# Patient Record
Sex: Female | Born: 1985 | Hispanic: No | Marital: Married | Smoking: Never smoker
Health system: Southern US, Community
[De-identification: ages and names within clinical notes are randomized; demographics above are authoritative.]

## PROBLEM LIST (undated history)

## (undated) DIAGNOSIS — R519 Headache, unspecified: Secondary | ICD-10-CM

## (undated) DIAGNOSIS — O24419 Gestational diabetes mellitus in pregnancy, unspecified control: Secondary | ICD-10-CM

## (undated) DIAGNOSIS — O139 Gestational [pregnancy-induced] hypertension without significant proteinuria, unspecified trimester: Secondary | ICD-10-CM

## (undated) DIAGNOSIS — Z789 Other specified health status: Secondary | ICD-10-CM

## (undated) HISTORY — PX: NO PAST SURGERIES: SHX2092

## (undated) HISTORY — DX: Gestational (pregnancy-induced) hypertension without significant proteinuria, unspecified trimester: O13.9

---

## 2012-05-04 NOTE — L&D Delivery Note (Signed)
Delivery Note At 10:33 PM a viable female was delivered via Vaginal, Spontaneous Delivery (Presentation: ;  ).  Median episiotomy cut with immediate delivery of the head, nuchal cord noted, easily reduced after summersault maneuver.  APGAR: 8, 9; weight 7 lb 6.5 oz (3359 g).  Placenta status: Intact, spontaneous.  Cord: 3 vessels with the following complications: None.  Cord pH: not collected  Anesthesia: Epidural Local  Episiotomy: Median Lacerations: 3rd degree Suture Repair: 2.0 3.0 vicryl Est. Blood Loss (mL): 400  Mom to postpartum.  Baby to Couplet care / Skin to Skin.  Routine [facility] PP orders Breastfeeding Inpatient circ  Tami Crawford 03/17/2013, 1:30 AM

## 2012-07-21 ENCOUNTER — Ambulatory Visit: Payer: BC Managed Care – PPO | Admitting: Emergency Medicine

## 2012-07-21 VITALS — BP 116/70 | HR 76 | Temp 98.6°F | Resp 12 | Ht 67.0 in | Wt 170.0 lb

## 2012-07-21 DIAGNOSIS — Z331 Pregnant state, incidental: Secondary | ICD-10-CM

## 2012-07-21 DIAGNOSIS — R109 Unspecified abdominal pain: Secondary | ICD-10-CM

## 2012-07-21 DIAGNOSIS — R509 Fever, unspecified: Secondary | ICD-10-CM

## 2012-07-21 LAB — POCT URINALYSIS DIPSTICK
Blood, UA: NEGATIVE
Glucose, UA: NEGATIVE
Nitrite, UA: NEGATIVE
Protein, UA: NEGATIVE
Urobilinogen, UA: 0.2

## 2012-07-21 LAB — POCT CBC
Granulocyte percent: 46.3 %G (ref 37–80)
MCH, POC: 27.5 pg (ref 27–31.2)
MID (cbc): 0.5 (ref 0–0.9)
MPV: 9.4 fL (ref 0–99.8)
POC LYMPH PERCENT: 45.5 %L (ref 10–50)
POC MID %: 8.2 %M (ref 0–12)
Platelet Count, POC: 256 10*3/uL (ref 142–424)
RBC: 4.59 M/uL (ref 4.04–5.48)
RDW, POC: 15 %
WBC: 6 10*3/uL (ref 4.6–10.2)

## 2012-07-21 LAB — POCT UA - MICROSCOPIC ONLY
Casts, Ur, LPF, POC: NEGATIVE
Crystals, Ur, HPF, POC: NEGATIVE
WBC, Ur, HPF, POC: NEGATIVE
Yeast, UA: NEGATIVE

## 2012-07-21 MED ORDER — PRENATAL VIT W/ FE BISG-FA 25-1 MG PO TABS: 1.0000 | ORAL_TABLET | Freq: Every day | ORAL | Status: DC

## 2012-07-21 NOTE — Patient Instructions (Addendum)

## 2012-07-21 NOTE — Progress Notes (Signed)
  Subjective:    Patient ID: Tami Crawford, female    DOB: 10/18/1985, 27 y.o.   MRN: [id number]  HPI  27 year old female presents with history of fever at night x 3 weeks; abdominal pain 2 days ago; LMP 06/21/12.  patient is not on birth control; patient has never been pregnant.   Husband complains he has a bump on his penis.  Denies vaginal itching.  she has vaginal discharge.    Country of origin: [place].    Review of Systems     Objective:   Physical Exam  The patient is alert and cooperative and does not appear ill. The abdomen is flat the patient describes tenderness in the suprapubic area but I do not feel a mass in this area. Results for orders placed in visit on 07/21/12  POCT CBC      Result Value Range   WBC 6.0  4.6 - 10.2 K/uL   Lymph, poc 2.7  0.6 - 3.4   POC LYMPH PERCENT 45.5  10 - 50 %L   MID (cbc) 0.5  0 - 0.9   POC MID % 8.2  0 - 12 %M   POC Granulocyte 2.8  2 - 6.9   Granulocyte percent 46.3  37 - 80 %G   RBC 4.59  4.04 - 5.48 M/uL   Hemoglobin 12.6  12.2 - 16.2 g/dL   HCT, POC 14.7  82.9 - 47.9 %   MCV 85.8  80 - 97 fL   MCH, POC 27.5  27 - 31.2 pg   MCHC 32.0  31.8 - 35.4 g/dL   RDW, POC 56.2     Platelet Count, POC 256  142 - 424 K/uL   MPV 9.4  0 - 99.8 fL  POCT URINALYSIS DIPSTICK      Result Value Range   Color, UA yellow     Clarity, UA clear     Glucose, UA negative     Bilirubin, UA negative     Ketones, UA negative     Spec Grav, UA 1.020     Blood, UA negative     pH, UA 6.0     Protein, UA negative     Urobilinogen, UA 0.2     Nitrite, UA negative     Leukocytes, UA Negative    POCT URINE PREGNANCY      Result Value Range   Preg Test, Ur Positive    POCT UA - MICROSCOPIC ONLY      Result Value Range   WBC, Ur, HPF, POC negative     RBC, urine, microscopic 0-1     Bacteria, U Microscopic negative     Mucus, UA trace     Epithelial cells, urine per micros 0-2     Crystals, Ur, HPF, POC negative     Casts, Ur, LPF, POC negative      Yeast, UA negative         Assessment & Plan:  We'll start with a CBC urine and urine hCG. If these are unrevealing we'll probably need to per proceed with a vaginal exam and check for vaginitis. Apparently there is some question that the husband has some bumps in his genital area and I'll go ahead and check this also .

## 2012-08-18 LAB — OB RESULTS CONSOLE ANTIBODY SCREEN: Antibody Screen: NEGATIVE

## 2012-08-18 LAB — OB RESULTS CONSOLE ABO/RH: RH Type: POSITIVE

## 2012-08-18 LAB — OB RESULTS CONSOLE HEPATITIS B SURFACE ANTIGEN: Hepatitis B Surface Ag: NEGATIVE

## 2012-08-18 LAB — OB RESULTS CONSOLE RPR: RPR: NONREACTIVE

## 2012-09-08 ENCOUNTER — Other Ambulatory Visit: Payer: Self-pay | Admitting: Obstetrics and Gynecology

## 2012-09-08 ENCOUNTER — Other Ambulatory Visit (HOSPITAL_COMMUNITY)
Admission: RE | Admit: 2012-09-08 | Discharge: 2012-09-08 | Disposition: A | Discharge status: Home / Self Care | Payer: BC Managed Care – PPO | Source: Ambulatory Visit | Attending: Obstetrics and Gynecology | Admitting: Obstetrics and Gynecology

## 2012-09-08 DIAGNOSIS — Z01419 Encounter for gynecological examination (general) (routine) without abnormal findings: Secondary | ICD-10-CM | POA: Insufficient documentation

## 2012-11-07 ENCOUNTER — Other Ambulatory Visit: Payer: Self-pay | Admitting: Obstetrics and Gynecology

## 2012-11-07 DIAGNOSIS — Z3689 Encounter for other specified antenatal screening: Secondary | ICD-10-CM

## 2012-11-09 ENCOUNTER — Ambulatory Visit (HOSPITAL_COMMUNITY)
Admission: RE | Admit: 2012-11-09 | Discharge: 2012-11-09 | Disposition: A | Discharge status: Home / Self Care | Payer: BC Managed Care – PPO | Source: Ambulatory Visit | Attending: Obstetrics and Gynecology | Admitting: Obstetrics and Gynecology

## 2012-11-09 DIAGNOSIS — Z1389 Encounter for screening for other disorder: Secondary | ICD-10-CM | POA: Insufficient documentation

## 2012-11-09 DIAGNOSIS — O0930 Supervision of pregnancy with insufficient antenatal care, unspecified trimester: Secondary | ICD-10-CM | POA: Insufficient documentation

## 2012-11-09 DIAGNOSIS — Z3689 Encounter for other specified antenatal screening: Secondary | ICD-10-CM

## 2012-11-09 DIAGNOSIS — Z363 Encounter for antenatal screening for malformations: Secondary | ICD-10-CM | POA: Insufficient documentation

## 2012-11-09 DIAGNOSIS — O358XX0 Maternal care for other (suspected) fetal abnormality and damage, not applicable or unspecified: Secondary | ICD-10-CM | POA: Insufficient documentation

## 2013-02-11 LAB — OB RESULTS CONSOLE GBS: GBS: POSITIVE

## 2013-03-14 ENCOUNTER — Inpatient Hospital Stay (HOSPITAL_COMMUNITY)
Admission: AD | Admit: 2013-03-14 | Discharge: 2013-03-18 | DRG: 775 | Disposition: A | Discharge status: Home / Self Care | Payer: BC Managed Care – PPO | Source: Ambulatory Visit | Attending: Obstetrics and Gynecology | Admitting: Obstetrics and Gynecology

## 2013-03-14 ENCOUNTER — Encounter (HOSPITAL_COMMUNITY): Payer: Self-pay | Admitting: *Deleted

## 2013-03-14 ENCOUNTER — Other Ambulatory Visit: Payer: Self-pay | Admitting: Obstetrics and Gynecology

## 2013-03-14 DIAGNOSIS — Z22330 Carrier of Group B streptococcus: Secondary | ICD-10-CM

## 2013-03-14 DIAGNOSIS — D279 Benign neoplasm of unspecified ovary: Secondary | ICD-10-CM | POA: Diagnosis present

## 2013-03-14 DIAGNOSIS — Z8759 Personal history of other complications of pregnancy, childbirth and the puerperium: Secondary | ICD-10-CM | POA: Diagnosis present

## 2013-03-14 DIAGNOSIS — O139 Gestational [pregnancy-induced] hypertension without significant proteinuria, unspecified trimester: Principal | ICD-10-CM | POA: Diagnosis present

## 2013-03-14 DIAGNOSIS — O99892 Other specified diseases and conditions complicating childbirth: Secondary | ICD-10-CM | POA: Diagnosis present

## 2013-03-14 DIAGNOSIS — E876 Hypokalemia: Secondary | ICD-10-CM | POA: Diagnosis not present

## 2013-03-14 DIAGNOSIS — O99820 Streptococcus B carrier state complicating pregnancy: Secondary | ICD-10-CM

## 2013-03-14 DIAGNOSIS — O34599 Maternal care for other abnormalities of gravid uterus, unspecified trimester: Secondary | ICD-10-CM | POA: Diagnosis present

## 2013-03-14 HISTORY — DX: Other specified health status: Z78.9

## 2013-03-14 LAB — CBC
Hemoglobin: 11.4 g/dL — ABNORMAL LOW (ref 12.0–15.0)
MCH: 29.5 pg (ref 26.0–34.0)
MCHC: 34.3 g/dL (ref 30.0–36.0)
Platelets: 162 10*3/uL (ref 150–400)
RDW: 13.9 % (ref 11.5–15.5)

## 2013-03-14 LAB — COMPREHENSIVE METABOLIC PANEL
ALT: 11 U/L (ref 0–35)
AST: 19 U/L (ref 0–37)
Albumin: 2.8 g/dL — ABNORMAL LOW (ref 3.5–5.2)
Alkaline Phosphatase: 161 U/L — ABNORMAL HIGH (ref 39–117)
Potassium: 2.6 mEq/L — CL (ref 3.5–5.1)
Sodium: 138 mEq/L (ref 135–145)
Total Protein: 5.9 g/dL — ABNORMAL LOW (ref 6.0–8.3)

## 2013-03-14 MED ORDER — TERBUTALINE SULFATE 1 MG/ML IJ SOLN: 0.2500 mg | Freq: Once | INTRAMUSCULAR | Status: AC | PRN

## 2013-03-14 MED ORDER — ACETAMINOPHEN 325 MG PO TABS: 650.0000 mg | ORAL_TABLET | ORAL | Status: DC | PRN

## 2013-03-14 MED ORDER — PENICILLIN G POTASSIUM 5000000 UNITS IJ SOLR
2.5000 10*6.[IU] | INTRAVENOUS | Status: DC
  Administered 2013-03-15: 2.5 10*6.[IU] via INTRAVENOUS
  Filled 2013-03-14 (×5): qty 2.5

## 2013-03-14 MED ORDER — ONDANSETRON HCL 4 MG/2ML IJ SOLN
4.0000 mg | Freq: Four times a day (QID) | INTRAMUSCULAR | Status: DC | PRN
  Administered 2013-03-16: 4 mg via INTRAVENOUS
  Filled 2013-03-14: qty 2

## 2013-03-14 MED ORDER — PENICILLIN G POTASSIUM 5000000 UNITS IJ SOLR
5.0000 10*6.[IU] | Freq: Once | INTRAVENOUS | Status: AC
  Administered 2013-03-15: 5 10*6.[IU] via INTRAVENOUS
  Filled 2013-03-14: qty 5

## 2013-03-14 MED ORDER — LACTATED RINGERS IV SOLN
500.0000 mL | INTRAVENOUS | Status: DC | PRN
  Administered 2013-03-14: 500 mL via INTRAVENOUS

## 2013-03-14 MED ORDER — BUTORPHANOL TARTRATE 1 MG/ML IJ SOLN
1.0000 mg | INTRAMUSCULAR | Status: DC | PRN
  Administered 2013-03-16: 1 mg via INTRAVENOUS
  Filled 2013-03-14: qty 1

## 2013-03-14 MED ORDER — OXYTOCIN BOLUS FROM INFUSION
500.0000 mL | INTRAVENOUS | Status: DC
  Administered 2013-03-16: 500 mL via INTRAVENOUS

## 2013-03-14 MED ORDER — OXYTOCIN 40 UNITS IN LACTATED RINGERS INFUSION - SIMPLE MED: 62.5000 mL/h | INTRAVENOUS | Status: DC

## 2013-03-14 MED ORDER — POTASSIUM CHLORIDE 2 MEQ/ML IV SOLN
INTRAVENOUS | Status: DC
  Administered 2013-03-14 – 2013-03-15 (×2): via INTRAVENOUS
  Filled 2013-03-14 (×5): qty 1000

## 2013-03-14 MED ORDER — LABETALOL HCL 5 MG/ML IV SOLN
10.0000 mg | INTRAVENOUS | Status: DC | PRN
  Filled 2013-03-14: qty 8

## 2013-03-14 MED ORDER — OXYCODONE-ACETAMINOPHEN 5-325 MG PO TABS: 1.0000 | ORAL_TABLET | ORAL | Status: DC | PRN

## 2013-03-14 MED ORDER — OXYTOCIN 40 UNITS IN LACTATED RINGERS INFUSION - SIMPLE MED
1.0000 m[IU]/min | INTRAVENOUS | Status: DC
  Administered 2013-03-14: 1 m[IU]/min via INTRAVENOUS
  Filled 2013-03-14: qty 1000

## 2013-03-14 MED ORDER — MISOPROSTOL 25 MCG QUARTER TABLET: 25.0000 ug | ORAL_TABLET | ORAL | Status: DC | PRN

## 2013-03-14 MED ORDER — IBUPROFEN 600 MG PO TABS
600.0000 mg | ORAL_TABLET | Freq: Four times a day (QID) | ORAL | Status: DC | PRN
  Administered 2013-03-17: 600 mg via ORAL
  Filled 2013-03-14: qty 1

## 2013-03-14 MED ORDER — LIDOCAINE HCL (PF) 1 % IJ SOLN
30.0000 mL | INTRAMUSCULAR | Status: DC | PRN
  Filled 2013-03-14 (×2): qty 30

## 2013-03-14 MED ORDER — LACTATED RINGERS IV SOLN
INTRAVENOUS | Status: DC
  Administered 2013-03-14: 21:00:00 via INTRAVENOUS

## 2013-03-14 MED ORDER — OXYTOCIN 40 UNITS IN LACTATED RINGERS INFUSION - SIMPLE MED: 1.0000 m[IU]/min | INTRAVENOUS | Status: DC

## 2013-03-14 MED ORDER — CITRIC ACID-SODIUM CITRATE 334-500 MG/5ML PO SOLN: 30.0000 mL | ORAL | Status: DC | PRN

## 2013-03-14 NOTE — Progress Notes (Addendum)
Subjective: Resting comfortably--husband at bedside as translator.  Objective: BP 120/67  Pulse 76  Temp(Src) 98.3 F (36.8 C) (Oral)  Resp 18  LMP 06/21/2012  Filed Vitals:   03/14/13 2232 03/14/13 2301 03/14/13 2309 03/14/13 2332  BP: 132/65 121/68 129/76 120/67  Pulse: 80 82 78 76  Temp:    98.3 F (36.8 C)  TempSrc:    Oral  Resp: 18 18 18 18      Total I/O In: -  Out: 1000 [Urine:1000]  Results for orders placed during the hospital encounter of 03/14/13 (from the past 24 hour(s))  CBC     Status: Abnormal   Collection Time    03/14/13  8:40 PM      Result Value Range   WBC 8.0  4.0 - 10.5 K/uL   RBC 3.87  3.87 - 5.11 MIL/uL   Hemoglobin 11.4 (*) 12.0 - 15.0 g/dL   HCT 78.2 (*) 95.6 - 21.3 %   MCV 85.8  78.0 - 100.0 fL   MCH 29.5  26.0 - 34.0 pg   MCHC 34.3  30.0 - 36.0 g/dL   RDW 08.6  57.8 - 46.9 %   Platelets 162  150 - 400 K/uL  COMPREHENSIVE METABOLIC PANEL     Status: Abnormal   Collection Time    03/14/13  8:40 PM      Result Value Range   Sodium 138  135 - 145 mEq/L   Potassium 2.6 (*) 3.5 - 5.1 mEq/L   Chloride 104  96 - 112 mEq/L   CO2 23  19 - 32 mEq/L   Glucose, Bld 168 (*) 70 - 99 mg/dL   BUN 4 (*) 6 - 23 mg/dL   Creatinine, Ser 6.29 (*) 0.50 - 1.10 mg/dL   Calcium 9.6  8.4 - 52.8 mg/dL   Total Protein 5.9 (*) 6.0 - 8.3 g/dL   Albumin 2.8 (*) 3.5 - 5.2 g/dL   AST 19  0 - 37 U/L   ALT 11  0 - 35 U/L   Alkaline Phosphatase 161 (*) 39 - 117 U/L   Total Bilirubin 0.3  0.3 - 1.2 mg/dL   GFR calc non Af Amer >90  >90 mL/min   GFR calc Af Amer >90  >90 mL/min  LACTATE DEHYDROGENASE     Status: None   Collection Time    03/14/13  8:40 PM      Result Value Range   LDH 168  94 - 250 U/L  URIC ACID     Status: None   Collection Time    03/14/13  8:40 PM      Result Value Range   Uric Acid, Serum 3.2  2.4 - 7.0 mg/dL   Protein/creatnine ratio pending.  FHT:  Initial tracing baseline 150-160, with frequent quick variables.  Frequent  uterine irritability noted, with one more defined contraction--had associated decel at 8:56p with that contraction, with FHR with marked variability during decel and nadir of decel to 90 bpm over 3 min period.  BP at the time of that decel was 149/97 Subsequent tracing still had elevated baseline, frequent mild variables--one additional decel at 10:20p associated with more defined contraction at 9:45p, with BP 127/64. FHR now reactive, single decel with UC at 11:28p, but moderate variability and baseline 120-130.  UC:   Moderate irritability, occasional more defined UCs SVE:   Dilation: Fingertip Effacement (%): Thick Station: -3 Exam by:: Manfred Arch,, CNM Cervis posterior, firm, vtx high but definitively vtx.  DTR 2+ without clonus, 1+ edema.  Assessment / Plan: Induction for gestational hypertension--BP normal at present. Sporadic FHR changes with UCs, but overall reassuring. Unfavorable cervix Moderate uterine irritability GBS positive Hypokalemia  Plan: Consulted with Dr. Pennie Rushing Low-dose pitocin initiated for cervical ripening at 11:09p. IV LR with 40 meq KCL at 125 cc/hr for K+ replacement. Reviewed status and plan of care with patient and her husband, with husband as interpreter per patient request.  Advised them we would carefully watch the FHR.  If FHR demonstrated persistent non-reassuring status, C/S may be required.   GBS prophylaxis. Labetalol IV prn BP elevation.   Nigel Bridgeman 03/14/2013, 11:44 PM

## 2013-03-14 NOTE — Progress Notes (Signed)
Critical potassium value of 2.6 reported to v [facility] cnm

## 2013-03-14 NOTE — H&P (Signed)
Tami Crawford is a 27 y.o. female G1 at 24 2/7 weeks revised by a 22 week ultrasound admitted for induction of labor due to [facility] at term. Pt presented at office for routine ob visit today.  BP was 120/98.  Pt denies headache, visual changes, RUQ pain.  Has had elevated diastolic BP for the last month but it has been labile.  1 week prior BP was 120/88.  Pt has been ruled out for preeclampsia twice.  Negative protein on urine dip today.  Incidentally, she has had hypokalemia.  Last potassium was 2.8 on 11/4.  She is s/p a 2 day course of K-Dur. Pregnancy has also been complicated by a right dermoid cyst.  At 22 weeks, it measured 5 cm. One week ago, it was 4.5 cm.  Pt has been asymptomatic. Pt is from [place] and her first language is [place].  Her husband speaks English moderately well.  Maternal Medical History:  Reason for admission: Nausea. Gestational hypertension, elevated diastolic BP  Contractions: Frequency: rare.   Perceived severity is mild.    Fetal activity: Perceived fetal activity is normal.   Last perceived fetal movement was within the past hour.    Prenatal complications: PIH.   Right dermoid cyst ~4.5 cm  Prenatal Complications - Diabetes: none.    OB History   Grav Para Term Preterm Abortions TAB SAB Ect Mult Living                 No past medical history on file. No past surgical history on file. Family History: family history is not on file. Social History:  reports that she has never smoked. She does not have any smokeless tobacco history on file. She reports that she does not drink alcohol or use illicit drugs.   Prenatal Transfer Tool  Maternal Diabetes: No Genetic Screening: Normal Maternal Ultrasounds/Referrals: Abnormal:  Findings:   Other: Fetal Ultrasounds or other Referrals:  Other: Mother with right dermoid cyst. Maternal Substance Abuse:  No Significant Maternal Medications:  None Significant Maternal Lab Results:  Lab values include: Group B Strep  positive Other Comments:  From [place], [place]  Review of Systems  Constitutional: Negative for fever and chills.  Eyes:       Denies visual changes.  Respiratory: Negative for shortness of breath.   Gastrointestinal: Negative for nausea, vomiting and abdominal pain.  Neurological: Negative for headaches.      There were no vitals taken for this visit. Maternal Exam:  Uterine Assessment: No contractions  Abdomen: Patient reports no abdominal tenderness. Fundal height is 39 cm.   Estimated fetal weight is 8 1/2 lbs by Le[place], 7lbs 14 oz by ultrasound on 03/07/13.   Fetal presentation: vertex  Introitus: Normal vulva. Normal vagina.  Ferning test: not done.  Nitrazine test: not done. Amniotic fluid character: not assessed.  Pelvis: adequate for delivery.   Cervix: Cervix evaluated by digital exam.     Fetal Exam Fetal Monitor Review: Mode: hand-held doppler probe.   Baseline rate: 140s.      Physical Exam  Constitutional: She is oriented to person, place, and time. She appears well-developed and well-nourished. No distress.  HENT:  Head: Normocephalic and atraumatic.  Eyes: EOM are normal.  Neck: Normal range of motion.  Cardiovascular: Normal rate, regular rhythm and normal heart sounds.   Respiratory: Effort normal and breath sounds normal. No respiratory distress. She has no wheezes. She has no rales.  GI:  Gravid, no RUQ pain  Genitourinary: Uterus normal.  Musculoskeletal: Normal range of motion. She exhibits edema.  Neurological: She is alert and oriented to person, place, and time. She has normal reflexes.  1-2+ DTR  Skin: Skin is warm and dry. She is not diaphoretic.  Psychiatric: She has a normal mood and affect. Her behavior is normal.    Prenatal labs: ABO, Rh:   Antibody:   Rubella:   RPR:    HBsAg:    HIV:    GBS:     Assessment/Plan: IUP at 40 2/7 weeks, Gestational hypertension.  Mildly elevated diastolic today. Right dermoid  cyst. GBS+ H/o hypokalemia. Unfavorable cervix.  Due to gestational hypertension at term, with elevated BP, recommend induction of labor. PIH labs and protein/creatnine ratio on admission. Cytotec per vagina.  Pitocin if SROM or 2 cm.  If FT-1 in am, will attempt to place a Foley bulb. Penicillin with active labor or SROM.  Replace K+ prn. Husband interpreted management plan and indication for delivery.  All questions answered.  Geryl Rankins 03/14/2013, 6:15 PM

## 2013-03-15 ENCOUNTER — Inpatient Hospital Stay (HOSPITAL_COMMUNITY): Payer: BC Managed Care – PPO

## 2013-03-15 DIAGNOSIS — Z22330 Carrier of Group B streptococcus: Secondary | ICD-10-CM

## 2013-03-15 DIAGNOSIS — O99820 Streptococcus B carrier state complicating pregnancy: Secondary | ICD-10-CM

## 2013-03-15 DIAGNOSIS — Z8759 Personal history of other complications of pregnancy, childbirth and the puerperium: Secondary | ICD-10-CM | POA: Diagnosis present

## 2013-03-15 DIAGNOSIS — O139 Gestational [pregnancy-induced] hypertension without significant proteinuria, unspecified trimester: Secondary | ICD-10-CM | POA: Diagnosis present

## 2013-03-15 HISTORY — DX: Personal history of other complications of pregnancy, childbirth and the puerperium: Z87.59

## 2013-03-15 LAB — HEMOGLOBIN A1C
Hgb A1c MFr Bld: 5.7 % — ABNORMAL HIGH (ref <5.7)
Mean Plasma Glucose: 117 mg/dL — ABNORMAL HIGH (ref <117)

## 2013-03-15 LAB — COMPREHENSIVE METABOLIC PANEL
AST: 17 U/L (ref 0–37)
Albumin: 2.6 g/dL — ABNORMAL LOW (ref 3.5–5.2)
Alkaline Phosphatase: 167 U/L — ABNORMAL HIGH (ref 39–117)
BUN: 3 mg/dL — ABNORMAL LOW (ref 6–23)
Chloride: 107 mEq/L (ref 96–112)
Potassium: 2.7 mEq/L — CL (ref 3.5–5.1)
Sodium: 142 mEq/L (ref 135–145)
Total Bilirubin: 0.5 mg/dL (ref 0.3–1.2)
Total Protein: 5.8 g/dL — ABNORMAL LOW (ref 6.0–8.3)

## 2013-03-15 LAB — CBC
MCHC: 34.1 g/dL (ref 30.0–36.0)
Platelets: 152 10*3/uL (ref 150–400)
RDW: 13.9 % (ref 11.5–15.5)

## 2013-03-15 LAB — PROTEIN / CREATININE RATIO, URINE
Creatinine, Urine: 14.89 mg/dL
Protein Creatinine Ratio: 0.28 — ABNORMAL HIGH (ref 0.00–0.15)
Total Protein, Urine: 4.2 mg/dL

## 2013-03-15 LAB — RUBELLA SCREEN: Rubella: 6.27 Index — ABNORMAL HIGH (ref <0.90)

## 2013-03-15 LAB — MAGNESIUM: Magnesium: 1.4 mg/dL — ABNORMAL LOW (ref 1.5–2.5)

## 2013-03-15 LAB — LACTATE DEHYDROGENASE: LDH: 154 U/L (ref 94–250)

## 2013-03-15 MED ORDER — LACTATED RINGERS IV SOLN
INTRAVENOUS | Status: DC
  Administered 2013-03-15 – 2013-03-16 (×3): via INTRAVENOUS

## 2013-03-15 MED ORDER — POTASSIUM CHLORIDE 2 MEQ/ML IV SOLN: INTRAVENOUS | Status: DC

## 2013-03-15 MED ORDER — MAGNESIUM SULFATE 40 MG/ML IJ SOLN
2.0000 g | Freq: Once | INTRAMUSCULAR | Status: DC
  Filled 2013-03-15 (×2): qty 50

## 2013-03-15 MED ORDER — OXYTOCIN 40 UNITS IN LACTATED RINGERS INFUSION - SIMPLE MED
1.0000 m[IU]/min | INTRAVENOUS | Status: DC
  Administered 2013-03-15: 2 m[IU]/min via INTRAVENOUS
  Filled 2013-03-15: qty 1000

## 2013-03-15 MED ORDER — TERBUTALINE SULFATE 1 MG/ML IJ SOLN: 0.2500 mg | Freq: Once | INTRAMUSCULAR | Status: AC | PRN

## 2013-03-15 MED ORDER — MAGNESIUM SULFATE 50 % IJ SOLN
2.0000 g | Freq: Once | INTRAVENOUS | Status: AC
  Administered 2013-03-15: 2 g via INTRAVENOUS
  Filled 2013-03-15: qty 4

## 2013-03-15 MED ORDER — POTASSIUM CHLORIDE 10 MEQ/100ML IV SOLN
10.0000 meq | INTRAVENOUS | Status: AC
  Administered 2013-03-15 (×3): 10 meq via INTRAVENOUS
  Filled 2013-03-15 (×3): qty 100

## 2013-03-15 MED ORDER — POTASSIUM CHLORIDE CRYS ER 20 MEQ PO TBCR
40.0000 meq | EXTENDED_RELEASE_TABLET | Freq: Two times a day (BID) | ORAL | Status: AC
  Administered 2013-03-15 (×2): 40 meq via ORAL
  Filled 2013-03-15 (×2): qty 2

## 2013-03-15 NOTE — Progress Notes (Signed)
Tami Crawford is a 27 y.o. G1P0 at [redacted]w[redacted]d admitted for gestational hypertension  Per Dr. Pennie Rushing, on call MD overnight, fetal tracing was not reassuring upon arrival so Cytotec was deferred and low dose Pitocin was started.  After hydration, fetal tracing improved.  BPs normal to mildly elevated.  Potassium was 2.6.  40 meq of KCl added to IVF.  Labs ordered this am.  Subjective: History obtained through husband.  Pt without complaints.  Objective: BP 126/84  Pulse 82  Temp(Src) 98.2 F (36.8 C) (Oral)  Resp 18  Ht 5\' 8"  (1.727 m)  Wt 91.173 kg (201 lb)  BMI 30.57 kg/m2  LMP 06/21/2012 I/O last 3 completed shifts: In: -  Out: 1000 [Urine:1000]    FHT:  Reactive, late decel x 1 min, good recovery.  Overall reassuring UC:   irregular SVE:   Dilation: Fingertip Effacement (%): Thick Station: -3 Exam by:: Manfred Arch,, CNM Cervix much softer but closed. Labs: Lab Results  Component Value Date   WBC 7.2 03/15/2013   HGB 12.1 03/15/2013   HCT 35.5* 03/15/2013   MCV 84.9 03/15/2013   PLT 152 03/15/2013   K+ 2.6 to 2.7 Pr/Cr ratio 0.28  Assessment / Plan: Induction of labor due to gestational hypertension,  progressing well on pitocin Hypokalemia.  IV infusion of KCL 10 meq q 1 hour x 3.  Labor: Progressing normally Continue slow titration of pitocin. Preeclampsia:  labs stable and mildly elevated BP Fetal Wellbeing:  Category I Pain Control:  Epidural I/D:  PCN for GBS prophylaxis with SROM or active labor Anticipated MOD:  NSVD  Elfreida Heggs 03/15/2013, 8:44 AM

## 2013-03-15 NOTE — Progress Notes (Signed)
Discussed with attending GYN. Will check mg level first and will supplement with K-dur 40 MEQ x 2 doses. Repeat BMP in AM. Will follow up in AM.   Debbora Presto, MD  [facility] Pager [phone]  If 7PM-7AM, please contact night-coverage [email or web address] Password [email or web address]

## 2013-03-15 NOTE — Progress Notes (Signed)
Ultrasound being performed at bedside.

## 2013-03-15 NOTE — Progress Notes (Signed)
Patient ID: Tami Crawford, female   DOB: [birth year], 27 y.o.   MRN: [id number]  Called to bedside due to prolonged decelerations.  Fetal tracing normalized in the left lateral decubitus position. Still on 6 mUs of Pitocin for cervical ripening.  Pt without complaints.  Denies headaches, visual changes or abdominal pain.  Denies vaginal bleeding or LOF. BP 162/114 (cuff was applied incorrectly by Tami Crawford) 133/77 currently. Gen:  Pt appears to have discomfort with IV infusion of KCl in the left arm. Abdomen:  nontender  EM:  Good variability, ~4 decels, to 65-80 bpm,  Variability good throughout decel.  Currently, reactive.  Contractions irregular.  Continue Pitocin.  Start to titrate in ~ 1 hour. Assess cervix after titration to see if foley bulb candidate.  Pt and Tami Crawford updated on fetal status and potential c-section for fetal tolerance to labor. Consult with hospitalist for possible etiology of hypokalemia.

## 2013-03-15 NOTE — Progress Notes (Addendum)
  Subjective: Patient has slept well during night--still asleep at present.  Objective: BP 126/81  Pulse 99  Temp(Src) 98.2 F (36.8 C) (Oral)  Resp 16  Ht 5\' 8"  (1.727 m)  Wt 201 lb (91.173 kg)  BMI 30.57 kg/m2  LMP 06/21/2012   Total I/O In: -  Out: 1000 [Urine:1000]  Filed Vitals:   03/15/13 0430 03/15/13 0502 03/15/13 0530 03/15/13 0601  BP: 109/69 139/98 124/78 126/81  Pulse: 81 84 85 99  Temp:   98.2 F (36.8 C)   TempSrc:   Oral   Resp: 18  16 16   Height:    5\' 8"  (1.727 m)  Weight:    201 lb (91.173 kg)     FHT:  Category 1 UC:   Irregular, mild--q 5-7 min SVE:  Deferred at present Pitocin on 5 mu/min for ripening  Assessment / Plan: Induction for gestational hypertension--BPs stable Unfavorable cervix FHR decels on admission--stable at present GBS positive Pitocin through night for cervical ripening.  Plan: Repeat PIH labs this am (patient likely will want epidural as labor progresses). Transfer of care to Dr. Dion Body at 7am--she will determine plan of care this am. Per consult with Dr. Pennie Rushing, will continue LR with 40 mEq KCL at 125 cc/hr at present--defer further plan to Dr. Dion Body.  Nigel Bridgeman 03/15/2013, 6:16 AM

## 2013-03-15 NOTE — Progress Notes (Signed)
Provider notified and aware of critical Potassium level--MD in route to evaluate

## 2013-03-15 NOTE — Progress Notes (Addendum)
  Subjective: Patient comfortable--no awareness of contractions.  "Very hungry".  Objective: BP 113/59  Pulse 88  Temp(Src) 98.4 F (36.9 C) (Oral)  Resp 20  Ht 5\' 8"  (1.727 m)  Wt 201 lb (91.173 kg)  BMI 30.57 kg/m2  LMP 06/21/2012 I/O last 3 completed shifts: In: -  Out: 1000 [Urine:1000]  Filed Vitals:   03/15/13 1902 03/15/13 1932 03/15/13 2106 03/15/13 2133  BP: 144/84 126/87 125/80 113/59  Pulse: 74 83 98 88  Temp:  98.4 F (36.9 C)    TempSrc:  Oral    Resp: 18 20    Height:      Weight:           FHT:  Category 1 UC:   Very irregular, mild. SVE:   Dilation: Fingertip Effacement (%): 50;60 Station: -3 Exam by:: Manfred Arch, cnm  Assessment / Plan: Induction of labor for gestational hypertension Unfavorable cervix Sporadic FHR changes with stronger contractions.  Plan: Consulted with Dr. Normand Sloop. Will run low-dose pitocin during night to assist with cervical ripening, with close observation of FHR status (started at 9;33p). Reviewed with patient and husband the need to observe FHR closely, in light of FHR decels noted during the day.  They understand if FHR demonstrates intolerance of labor, C/S may be required. Allowed patient to eat--will keep on clear liquids after MN.  Nigel Bridgeman 03/15/2013, 10:04 PM

## 2013-03-15 NOTE — Consult Note (Signed)
[facility] Medical Consultation  Tami Crawford ZOX:[id number] DOB: June 01, 1985 DOA: 03/14/2013 PCP: No primary provider on file.   Requesting physician: OB Date of consultation: 03/14/2013 Reason for consultation: Hypokalemia   Impression/Recommendations Principal Problem:   Gestational hypertension - may add Labetalol scheduled if needs better BP control   Hypokalemia - unclear etiology and possibly related to low Mg level - pt did have some coming per husband one week prior to this admission - give K-dur 40 MEQ x 2 doses and repeat BMP in AM   Hyperglycemia - will check A1C to rule out underlying diabetes  Active Problems:   GBS (group B Streptococcus carrier), +RV culture, currently pregnant - per primary team   I will followup again tomorrow. Please contact me if I can be of assistance in the meanwhile. Thank you for this consultation.  Chief Complaint: Hypokalemia on blood work   HPI:  Pt is 27 yo female, [redacted] weeks gestation admitted for labor. Found to be hypokalemic on admission and has been difficult to supplement. Pt denies chest pain or shortness of breath, no specific abdominal or urinary concerns. PT explains she has had non bloody vomiting and poor oral intake over the past 2 weeks but vomiting has now resolved. She denies any similar events in the past. TRH asked to evaluate hypokalemia.  Review of Systems:  Constitutional: Negative for fever, chills, diaphoresis, activity change, appetite change and fatigue.  HENT: Negative for ear pain, nosebleeds, congestion, facial swelling, rhinorrhea, neck pain, neck stiffness and ear discharge.   Eyes: Negative for pain, discharge, redness, itching and visual disturbance.  Respiratory: Negative for cough, choking, chest tightness, shortness of breath, wheezing and stridor.   Cardiovascular: Negative for chest pain, palpitations and leg swelling.  Gastrointestinal: Negative for abdominal distention.  Genitourinary:  Negative for dysuria, urgency, frequency, hematuria, flank pain, decreased urine volume, difficulty urinating and dyspareunia.  Musculoskeletal: Negative for back pain, joint swelling, arthralgias and gait problem.  Neurological: Negative for dizziness, tremors, seizures, syncope, facial asymmetry, speech difficulty, weakness, light-headedness, numbness and headaches.  Hematological: Negative for adenopathy. Does not bruise/bleed easily.  Psychiatric/Behavioral: Negative for hallucinations, behavioral problems, confusion, dysphoric mood, decreased concentration and agitation.     Past Medical History  Diagnosis Date  . Medical history non-contributory    Past Surgical History  Procedure Laterality Date  . No past surgeries     Social History:  reports that she has never smoked. She does not have any smokeless tobacco history on file. She reports that she does not drink alcohol or use illicit drugs.  No Known Allergies Family History  Problem Relation Age of Onset  . Family history unknown: Yes    Prior to Admission medications   Medication Sig Start Date End Date Taking? Authorizing Provider  Prenatal Vit w/ Fe Bisg-FA 25-1 MG TABS Take 1 tablet by mouth daily. 07/21/12  Yes Collene Gobble, MD   Physical Exam: Blood pressure 150/91, pulse 81, temperature 98.2 F (36.8 C), temperature source Oral, resp. rate 18, height 5\' 8"  (1.727 m), weight 91.173 kg (201 lb), last menstrual period 06/21/2012. Filed Vitals:   03/15/13 1232  BP: 150/91  Pulse: 81  Temp:   Resp: 18    Physical Exam  Constitutional: Appears well-developed and well-nourished. No distress.  HENT: Normocephalic. External right and left ear normal. Oropharynx is clear and moist.  Eyes: Conjunctivae and EOM are normal. PERRLA, no scleral icterus.  Neck: Normal ROM. Neck supple. No JVD. No tracheal deviation. No  thyromegaly.  CVS: RRR, S1/S2 +, no murmurs, no gallops, no carotid bruit.  Pulmonary: Effort and breath  sounds normal, no stridor, rhonchi, wheezes, rales.  Abdominal: Soft. BS +,  no distension, tenderness, rebound or guarding.  Musculoskeletal: Normal range of motion. No edema and no tenderness.  Lymphadenopathy: No lymphadenopathy noted, cervical, inguinal. Neuro: Alert. Normal reflexes, muscle tone coordination. No cranial nerve deficit. Skin: Skin is warm and dry. No rash noted. Not diaphoretic. No erythema. No pallor.  Psychiatric: Normal mood and affect. Behavior, judgment, thought content normal.   Labs on Admission:  Basic Metabolic Panel:  Recent Labs Lab 03/14/13 2040 03/15/13 0635 03/15/13 1349  NA 138 142  --   K 2.6* 2.7*  --   CL 104 107  --   CO2 23 26  --   GLUCOSE 168* 101*  --   BUN 4* 3*  --   CREATININE 0.46* 0.47*  --   CALCIUM 9.6 8.8  --   MG  --   --  1.4*   Liver Function Tests:  Recent Labs Lab 03/14/13 2040 03/15/13 0635  AST 19 17  ALT 11 12  ALKPHOS 161* 167*  BILITOT 0.3 0.5  PROT 5.9* 5.8*  ALBUMIN 2.8* 2.6*   CBC:  Recent Labs Lab 03/14/13 2040 03/15/13 0635  WBC 8.0 7.2  HGB 11.4* 12.1  HCT 33.2* 35.5*  MCV 85.8 84.9  PLT 162 152   Radiological Exams on Admission: No results found.  EKG: Not done   Time spent: 45 minutes   MAGICK-Jamyla Ard [facility] Pager [phone]  If 7PM-7AM, please contact night-coverage [email or web address] Password [facility] 03/15/2013, 2:34 PM

## 2013-03-15 NOTE — Progress Notes (Signed)
  Subjective: Sleeping soundly.  Husband at bedside.  Objective: BP 117/71  Pulse 76  Temp(Src) 98.3 F (36.8 C) (Oral)  Resp 18  LMP 06/21/2012   Total I/O In: -  Out: 1000 [Urine:1000]  Filed Vitals:   03/14/13 2309 03/14/13 2332 03/15/13 0002 03/15/13 0032  BP: 129/76 120/67 134/81 117/71  Pulse: 78 76 70 76  Temp:  98.3 F (36.8 C)    TempSrc:  Oral    Resp: 18 18 18 18    Protein/creatnine ratio = 0.28.  FHT:  Category 1 UC:   q 12-14 min, with irritability between. SVE:  Deferred Pitocin on 2 mu/min--planning to increase to 6 mu/min, then hold there.  Assessment / Plan: Induction for gestational hypertension Category 1 FHR at present GBS positive--will start with more active labor or if FHR changes become consistent.  Nigel Bridgeman 03/15/2013, 12:57 AM

## 2013-03-16 ENCOUNTER — Inpatient Hospital Stay (HOSPITAL_COMMUNITY): Payer: BC Managed Care – PPO | Admitting: Anesthesiology

## 2013-03-16 ENCOUNTER — Encounter (HOSPITAL_COMMUNITY): Payer: Self-pay | Admitting: Anesthesiology

## 2013-03-16 ENCOUNTER — Encounter (HOSPITAL_COMMUNITY): Payer: BC Managed Care – PPO | Admitting: Anesthesiology

## 2013-03-16 LAB — BASIC METABOLIC PANEL
BUN: 4 mg/dL — ABNORMAL LOW (ref 6–23)
CO2: 23 mEq/L (ref 19–32)
Calcium: 9.1 mg/dL (ref 8.4–10.5)
Chloride: 107 mEq/L (ref 96–112)
Creatinine, Ser: 0.48 mg/dL — ABNORMAL LOW (ref 0.50–1.10)
GFR calc non Af Amer: >90 mL/min (ref >90)

## 2013-03-16 LAB — CBC
HCT: 37.5 % (ref 36.0–46.0)
MCH: 29.3 pg (ref 26.0–34.0)
MCV: 86.4 fL (ref 78.0–100.0)
Platelets: 163 10*3/uL (ref 150–400)
RBC: 4.34 MIL/uL (ref 3.87–5.11)
WBC: 7.4 10*3/uL (ref 4.0–10.5)

## 2013-03-16 LAB — PROTEIN / CREATININE RATIO, URINE: Protein Creatinine Ratio: 0.2 — ABNORMAL HIGH (ref 0.00–0.15)

## 2013-03-16 LAB — MAGNESIUM: Magnesium: 1.7 mg/dL (ref 1.5–2.5)

## 2013-03-16 MED ORDER — MAGNESIUM SULFATE 50 % IJ SOLN
2.0000 g | Freq: Once | INTRAVENOUS | Status: AC
  Administered 2013-03-16: 2 g via INTRAVENOUS
  Filled 2013-03-16: qty 4

## 2013-03-16 MED ORDER — EPHEDRINE 5 MG/ML INJ
10.0000 mg | INTRAVENOUS | Status: DC | PRN
  Filled 2013-03-16: qty 4
  Filled 2013-03-16: qty 2

## 2013-03-16 MED ORDER — DEXTROSE 5 % IV SOLN
5.0000 10*6.[IU] | Freq: Once | INTRAVENOUS | Status: AC
  Administered 2013-03-16: 5 10*6.[IU] via INTRAVENOUS
  Filled 2013-03-16: qty 5

## 2013-03-16 MED ORDER — FENTANYL 2.5 MCG/ML BUPIVACAINE 1/10 % EPIDURAL INFUSION (WH - ANES)
INTRAMUSCULAR | Status: DC | PRN
  Administered 2013-03-16: 14 mL/h via EPIDURAL

## 2013-03-16 MED ORDER — PENICILLIN G POTASSIUM 5000000 UNITS IJ SOLR
2.5000 10*6.[IU] | INTRAVENOUS | Status: DC
  Administered 2013-03-16 (×3): 2.5 10*6.[IU] via INTRAVENOUS
  Filled 2013-03-16 (×8): qty 2.5

## 2013-03-16 MED ORDER — EPHEDRINE 5 MG/ML INJ
10.0000 mg | INTRAVENOUS | Status: DC | PRN
  Filled 2013-03-16: qty 2

## 2013-03-16 MED ORDER — FENTANYL 2.5 MCG/ML BUPIVACAINE 1/10 % EPIDURAL INFUSION (WH - ANES)
14.0000 mL/h | INTRAMUSCULAR | Status: DC | PRN
  Administered 2013-03-16 (×2): 14 mL/h via EPIDURAL
  Filled 2013-03-16 (×3): qty 125

## 2013-03-16 MED ORDER — PHENYLEPHRINE 40 MCG/ML (10ML) SYRINGE FOR IV PUSH (FOR BLOOD PRESSURE SUPPORT)
80.0000 ug | PREFILLED_SYRINGE | INTRAVENOUS | Status: DC | PRN
  Filled 2013-03-16: qty 10
  Filled 2013-03-16: qty 2

## 2013-03-16 MED ORDER — POTASSIUM CHLORIDE CRYS ER 20 MEQ PO TBCR
40.0000 meq | EXTENDED_RELEASE_TABLET | Freq: Once | ORAL | Status: AC
  Administered 2013-03-16: 40 meq via ORAL
  Filled 2013-03-16: qty 2

## 2013-03-16 MED ORDER — LACTATED RINGERS IV SOLN
500.0000 mL | Freq: Once | INTRAVENOUS | Status: AC
  Administered 2013-03-16: 1000 mL via INTRAVENOUS

## 2013-03-16 MED ORDER — MAGNESIUM SULFATE 40 MG/ML IJ SOLN: 2.0000 g | Freq: Once | INTRAMUSCULAR | Status: DC

## 2013-03-16 MED ORDER — PHENYLEPHRINE 40 MCG/ML (10ML) SYRINGE FOR IV PUSH (FOR BLOOD PRESSURE SUPPORT)
80.0000 ug | PREFILLED_SYRINGE | INTRAVENOUS | Status: DC | PRN
  Filled 2013-03-16: qty 2

## 2013-03-16 MED ORDER — LIDOCAINE HCL (PF) 1 % IJ SOLN
INTRAMUSCULAR | Status: DC | PRN
  Administered 2013-03-16 (×2): 4 mL

## 2013-03-16 MED ORDER — DIPHENHYDRAMINE HCL 50 MG/ML IJ SOLN: 12.5000 mg | INTRAMUSCULAR | Status: DC | PRN

## 2013-03-16 NOTE — Anesthesia Preprocedure Evaluation (Addendum)
Anesthesia Evaluation  Patient identified by MRN, date of birth, ID band Patient awake    Reviewed: Allergy & Precautions, H&P , Patient's Chart, lab work & pertinent test results  Airway Mallampati: III TM Distance: >3 FB Neck ROM: Full    Dental no notable dental hx. (+) Teeth Intact   Pulmonary neg pulmonary ROS,  breath sounds clear to auscultation  Pulmonary exam normal       Cardiovascular hypertension, negative cardio ROS  Rhythm:Regular Rate:Normal  PIH   Neuro/Psych negative neurological ROS  negative psych ROS   GI/Hepatic negative GI ROS, Neg liver ROS,   Endo/Other  negative endocrine ROS  Renal/GU negative Renal ROS  negative genitourinary   Musculoskeletal negative musculoskeletal ROS (+)   Abdominal   Peds  Hematology negative hematology ROS (+)   Anesthesia Other Findings   Reproductive/Obstetrics (+) Pregnancy PIH  Post dates                           Anesthesia Physical Anesthesia Plan  ASA: II  Anesthesia Plan: Epidural   Post-op Pain Management:    Induction:   Airway Management Planned: Natural Airway  Additional Equipment:   Intra-op Plan:   Post-operative Plan:   Informed Consent: I have reviewed the patients History and Physical, chart, labs and discussed the procedure including the risks, benefits and alternatives for the proposed anesthesia with the patient or authorized representative who has indicated his/her understanding and acceptance.     Plan Discussed with: Anesthesiologist  Anesthesia Plan Comments:         Anesthesia Quick Evaluation

## 2013-03-16 NOTE — Progress Notes (Signed)
[facility] PROGRESS NOTE  Israella Hubert HYQ:[id number] DOB: 04/08/1986 DOA: 03/14/2013 PCP: No primary provider on file.  Brief narrative: Pt is 27 yo female, [redacted] weeks gestation admitted for labor. Found to be hypokalemic on admission and has been difficult to supplement. Pt denies chest pain or shortness of breath, no specific abdominal or urinary concerns. PT explains she has had non bloody vomiting and poor oral intake over the past 2 weeks but vomiting has now resolved. She denies any similar events in the past. [facility] asked to evaluate hypokalemia.   *Principal Problem:  Gestational hypertension  - may add Labetalol scheduled if needs better BP control  Hypokalemia  - unclear etiology and possibly related to low Mg level and vomiting - pt did have some vomiting per husband one week prior to this admission  - K is improving this AM - Mg is within normal limits but on low end of target range - will give additional 2 gm IV Mg today and one more dose of K-dur 40 MEQ today - repeat Mg and BMP in AM Hyperglycemia  - A1C 5.9 Active Problems:  GBS (group B Streptococcus carrier), +RV culture, currently pregnant  - per primary team   Will sign off and please call us with additional questions.   Debbora Presto, MD  [facility] Pager [phone]  If 7PM-7AM, please contact night-coverage [email or web address] Password [email or web address]    Procedures/Studies: US Ob Limited  03/15/2013   OBSTETRICAL ULTRASOUND: This exam was performed within a [place] [facility]. The OB US report was generated in the AS system, and faxed to the ordering physician.   This report is also available in [employer] and in the [employer]. See AS Obstetric US report.  US Fetal Bpp W/o Non Stress  03/15/2013   OBSTETRICAL ULTRASOUND: This exam was performed within a [place] [facility]. The OB US report was generated in the AS system, and faxed to the ordering  physician.   This report is also available in [employer] and in the [employer]. See AS Obstetric US report.   HPI/Subjective: No events overnight.   Objective: Filed Vitals:   03/16/13 1032 03/16/13 1102 03/16/13 1132 03/16/13 1202  BP: 127/91 117/61 127/75 130/87  Pulse: 80 75 81 79  Temp:    98.4 F (36.9 C)  TempSrc:    Oral  Resp: 18 18 18 18   Height:      Weight:      SpO2:       No intake or output data in the 24 hours ending 03/16/13 1224  Exam:   General:  Pt is alert, follows commands appropriately, not in acute distress  Cardiovascular: Regular rate and rhythm, S1/S2, no murmurs, no rubs, no gallops  Respiratory: Clear to auscultation bilaterally, no wheezing, no crackles, no rhonchi  Data Reviewed: Basic Metabolic Panel:  Recent Labs Lab 03/14/13 2040 03/15/13 0635 03/15/13 1349 03/16/13 0525  NA 138 142  --  140  K 2.6* 2.7*  --  3.1*  CL 104 107  --  107  CO2 23 26  --  23  GLUCOSE 168* 101*  --  89  BUN 4* 3*  --  4*  CREATININE 0.46* 0.47*  --  0.48*  CALCIUM 9.6 8.8  --  9.1  MG  --   --  1.4* 1.7   Liver Function Tests:  Recent Labs Lab 03/14/13 2040 03/15/13 0635  AST 19 17  ALT 11 12  ALKPHOS 161* 167*  BILITOT 0.3 0.5  PROT 5.9* 5.8*  ALBUMIN 2.8* 2.6*   CBC:  Recent Labs Lab 03/14/13 2040 03/15/13 0635 03/16/13 0818  WBC 8.0 7.2 7.4  HGB 11.4* 12.1 12.7  HCT 33.2* 35.5* 37.5  MCV 85.8 84.9 86.4  PLT 162 152 163    Scheduled Meds: . magnesium sulfate 1 - 4 g bolus IVPB  2 g Intravenous Once  . pencillin G potassium IV  2.5 Million Units Intravenous Q4H  . potassium chloride  40 mEq Oral Once   Continuous Infusions: . fentaNYL 2.5 mcg/ml w/bupivacaine 1/10% in NS epidural infusion ( total)    . lactated ringers 125 mL/hr at 03/16/13 0806  . oxytocin 40 units in LR 1000 mL    . oxytocin 40 units in LR 1000 mL    . oxytocin 40 units in LR 1000 mL 8 milli-units/min (03/16/13 1202)    Debbora Presto, MD  [facility] Pager [phone]  If 7PM-7AM, please contact night-coverage [email or web address] Password [email or web address] 03/16/2013, 12:24 PM   LOS: 2 days

## 2013-03-16 NOTE — Anesthesia Procedure Notes (Signed)
Epidural Patient location during procedure: OB Start time: 03/16/2013 9:16 AM  Staffing Anesthesiologist: Evelynn Hench A. Performed by: anesthesiologist   Preanesthetic Checklist Completed: patient identified, site marked, surgical consent, pre-op evaluation, timeout performed, IV checked, risks and benefits discussed and monitors and equipment checked  Epidural Patient position: sitting Prep: site prepped and draped and DuraPrep Patient monitoring: continuous pulse ox and blood pressure Approach: midline Injection technique: LOR air  Needle:  Needle type: Tuohy  Needle gauge: 17 G Needle length: 9 cm and 9 Needle insertion depth: 6 cm Catheter type: closed end flexible Catheter size: 19 Gauge Catheter at skin depth: 11 cm Test dose: negative and Other  Assessment Events: blood not aspirated, injection not painful, no injection resistance, negative IV test and no paresthesia  Additional Notes Patient identified. Risks and benefits discussed including failed block, incomplete  Pain control, post dural puncture headache, nerve damage, paralysis, blood pressure Changes, nausea, vomiting, reactions to medications-both toxic and allergic and post Partum back pain. All questions were answered. Patient expressed understanding and wished to proceed. Sterile technique was used throughout procedure. Epidural site was Dressed with sterile barrier dressing. No paresthesias, signs of intravascular injection Or signs of intrathecal spread were encountered.  Patient was more comfortable after the epidural was dosed. Please see RN's note for documentation of vital signs and FHR which are stable.

## 2013-03-16 NOTE — Progress Notes (Signed)
  Subjective: Sleeping at intervals.  Objective: BP 133/92  Pulse 89  Temp(Src) 98.4 F (36.9 C) (Oral)  Resp 18  Ht 5\' 8"  (1.727 m)  Wt 201 lb (91.173 kg)  BMI 30.57 kg/m2  LMP 06/21/2012 I/O last 3 completed shifts: In: -  Out: 1000 [Urine:1000]  Filed Vitals:   03/15/13 2352 03/16/13 0001 03/16/13 0031 03/16/13 0101  BP: 125/72 128/92 130/90 133/92  Pulse: 86 89 89 89  Temp:  98.4 F (36.9 C)    TempSrc:  Oral    Resp: 16 18    Height:      Weight:       Hgb A1C 5.7, mean plasma glucose 117.    FHT:  Category 1 UC:   Occasional SVE:   Deferred Pitocin on 6 mu/min.  Assessment / Plan: Induction for gestational hypertension On pitocin for cervical ripening. Hypokalemia Hypomagnesia BMP and magnesium level already scheduled am lab draw.  Nigel Bridgeman 03/16/2013, 2:38 AM

## 2013-03-16 NOTE — Progress Notes (Signed)
Provider notified that pushing was attempted but pt not pushing well and requesting to labor down.

## 2013-03-16 NOTE — Progress Notes (Signed)
  Subjective: Sleeping soundly since Stadol at 3:45am.  Requested then due to UCs q 3-4 min.  Objective: BP 113/70  Pulse 77  Temp(Src) 98.4 F (36.9 C) (Oral)  Resp 18  Ht 5\' 8"  (1.727 m)  Wt 201 lb (91.173 kg)  BMI 30.57 kg/m2  LMP 06/21/2012 I/O last 3 completed shifts: In: -  Out: 1000 [Urine:1000] Filed Vitals:   03/16/13 0031 03/16/13 0101 03/16/13 0132 03/16/13 0202  BP: 130/90 133/92 121/80 113/70  Pulse: 89 89 88 77  Temp:      TempSrc:      Resp:      Height:      Weight:           FHT: Category 1  UC:   Irregular, q 5-7 min, mild. SVE:   Deferred  Assessment / Plan: Induction due to gestational hypertension--day 2 1/2. Will hold pitocin at current level--[facility]s will assume care at 7am and determine plan of care.  Nigel Bridgeman 03/16/2013, 6:12 AM

## 2013-03-16 NOTE — Progress Notes (Addendum)
OB PN:   S: Patient starting to feel more painful contractions.  O: BP 113/70  Pulse 77  Temp(Src) 98.4 F (36.9 C) (Oral)  Resp 18  Ht 5\' 8"  (1.727 m)  Wt 91.173 kg (201 lb)  BMI 30.57 kg/m2  LMP 06/21/2012  BP range: 113-152/59-100; mostly 120s/90s  FHT: 130, moderate variability, + accels, no decels overnight Toco: q4-64min SVE: 1-2/75/-3.  Foley balloon placed this am.  Labs: 11/12: K 2.7 11/13: K 3.1, Mag: 1.7  A/P: 27y G1P0 @ [redacted]w[redacted]d for IOL due to gestational HTN. -FWB-Cat. I -IOL: continue Pit per protocol.  Foley balloon placed @ 0730, to remain in place x 6hr -Pain management: s/p Stadol, pt desires epidural this am, will notify anesthesia -FEN:  Hypokalemia- replacement with Kdur, currently fluid with KCL, level improving as above  Hypomag- s/p replacement, current level wnl -gestational HTN:  BP range stable as above, no evidence of preeclampsia, PC ratio: (03/15/13) 0.2 -GBS positive, will start PCN per protocol  Myna Hidalgo, DO [phone] (pager) [phone] (office)

## 2013-03-16 NOTE — Progress Notes (Signed)
  Subjective: Pt laying in bed concentrating hard through UCs. Pt seems tired. Epidural PCA dose given just before entering room.  Pt reports some relief.  Objective: BP 149/97  Pulse 88  Temp(Src) 98.6 F (37 C) (Oral)  Resp 18  Ht 5\' 8"  (1.727 m)  Wt 91.173 kg (201 lb)  BMI 30.57 kg/m2  SpO2 99%  LMP 06/21/2012 I/O last 3 completed shifts: In: -  Out: 3400 [Urine:3400]    FHT:  Cat I UC:   regular, every 1.5 - 3.5 minutes  SVE:   Dilation: 9 Effacement (%): 90 Station: +1;+2 (caput) Exam by:: Enis Slipper, RN  Assessment / Plan:  Hypokalemia and Hypomagnesemia - with treatment  Labor: IOL for gestational HTN, IUPC and FSE already placed; 14 miliU; MVUs 150s Preeclampsia: 117/61 - 162/95 (0700 - 1956); No s/s;   Fetal Wellbeing: Cat I Pain Control: Epidural I/D: GBS pos; PCN given per protocol; AROM at 1245 (11/13) Anticipated MOD: SVD   Tami Crawford 03/16/2013, 7:44 PM

## 2013-03-16 NOTE — Progress Notes (Signed)
OB PN:   S: Patient comfortable with epidural.  O: BP 130/87  Pulse 79  Temp(Src) 98.4 F (36.9 C) (Oral)  Resp 18  Ht 5\' 8"  (1.727 m)  Wt 91.173 kg (201 lb)  BMI 30.57 kg/m2  SpO2 99%  LMP 06/21/2012 BP range: 113-148/75-121  FHT: 130, moderate variability, + accels, occasional variable decels; overall FHT reassuring Toco: q3-43min SVE: AROM, minimal bloody fluid, 5-6/50/-3, IUPC & FSE placed  Labs: 11/12: K 2.7 11/13: K 3.1, Mag: 1.7  A/P: 27y G1P0 @ 103w4d for IOL due to gestational HTN. -FWB-Cat. I -IOL: continue Pit per protocol.  Foley fell out.  Continue internal monitors. -Pain management: continue epidural -GBS positive, continue PCN per protocol -FEN:  Hypokalemia- replacement with Kdur, currently fluid with KCL, level improving as above  Hypomag- s/p replacement, current level wnl  IM consulted, recommended repeat Kdur and Mag rider replacement.  Will repeat lab check in am.   -gestational HTN:  BP range stable as above- no medications needed, will continue to monitor   no evidence of preeclampsia, PC ratio: (03/15/13) 0.2   Myna Hidalgo, DO [phone] (pager) [phone] (office)

## 2013-03-17 LAB — COMPREHENSIVE METABOLIC PANEL
ALT: 10 U/L (ref 0–35)
AST: 23 U/L (ref 0–37)
Albumin: 2.3 g/dL — ABNORMAL LOW (ref 3.5–5.2)
Alkaline Phosphatase: 166 U/L — ABNORMAL HIGH (ref 39–117)
CO2: 24 mEq/L (ref 19–32)
Calcium: 9.1 mg/dL (ref 8.4–10.5)
Chloride: 105 mEq/L (ref 96–112)
Creatinine, Ser: 0.64 mg/dL (ref 0.50–1.10)
GFR calc non Af Amer: >90 mL/min (ref >90)
Potassium: 3.1 mEq/L — ABNORMAL LOW (ref 3.5–5.1)
Total Bilirubin: 0.6 mg/dL (ref 0.3–1.2)

## 2013-03-17 LAB — CBC
Hemoglobin: 10.9 g/dL — ABNORMAL LOW (ref 12.0–15.0)
MCH: 29.5 pg (ref 26.0–34.0)
MCHC: 34.5 g/dL (ref 30.0–36.0)
Platelets: 170 10*3/uL (ref 150–400)
RBC: 3.69 MIL/uL — ABNORMAL LOW (ref 3.87–5.11)
WBC: 19.7 10*3/uL — ABNORMAL HIGH (ref 4.0–10.5)

## 2013-03-17 LAB — MAGNESIUM: Magnesium: 1.7 mg/dL (ref 1.5–2.5)

## 2013-03-17 LAB — PROTEIN / CREATININE RATIO, URINE: Total Protein, Urine: 12.4 mg/dL

## 2013-03-17 MED ORDER — PRENATAL MULTIVITAMIN CH
1.0000 | ORAL_TABLET | Freq: Every day | ORAL | Status: DC
Start: 2013-03-17 — End: 2013-03-18
  Administered 2013-03-17: 1 via ORAL
  Filled 2013-03-17: qty 1

## 2013-03-17 MED ORDER — OXYCODONE-ACETAMINOPHEN 5-325 MG PO TABS
1.0000 | ORAL_TABLET | ORAL | Status: DC | PRN
  Administered 2013-03-17: 2 via ORAL
  Filled 2013-03-17: qty 2

## 2013-03-17 MED ORDER — INFLUENZA VAC SPLIT QUAD 0.5 ML IM SUSP
0.5000 mL | INTRAMUSCULAR | Status: AC
  Administered 2013-03-17: 0.5 mL via INTRAMUSCULAR
  Filled 2013-03-17: qty 0.5

## 2013-03-17 MED ORDER — ONDANSETRON HCL 4 MG PO TABS: 4.0000 mg | ORAL_TABLET | ORAL | Status: DC | PRN

## 2013-03-17 MED ORDER — DIBUCAINE 1 % RE OINT: 1.0000 "application " | TOPICAL_OINTMENT | RECTAL | Status: DC | PRN

## 2013-03-17 MED ORDER — WITCH HAZEL-GLYCERIN EX PADS: 1.0000 "application " | MEDICATED_PAD | CUTANEOUS | Status: DC | PRN

## 2013-03-17 MED ORDER — BENZOCAINE-MENTHOL 20-0.5 % EX AERO
1.0000 | INHALATION_SPRAY | CUTANEOUS | Status: DC | PRN
Start: 2013-03-17 — End: 2013-03-18

## 2013-03-17 MED ORDER — IBUPROFEN 600 MG PO TABS
600.0000 mg | ORAL_TABLET | Freq: Four times a day (QID) | ORAL | Status: DC
  Administered 2013-03-17 – 2013-03-18 (×5): 600 mg via ORAL
  Filled 2013-03-17 (×5): qty 1

## 2013-03-17 MED ORDER — MAGNESIUM GLUCONATE 500 MG PO TABS: 500.0000 mg | ORAL_TABLET | Freq: Every day | ORAL | Status: DC

## 2013-03-17 MED ORDER — DIPHENHYDRAMINE HCL 25 MG PO CAPS: 25.0000 mg | ORAL_CAPSULE | Freq: Four times a day (QID) | ORAL | Status: DC | PRN

## 2013-03-17 MED ORDER — LANOLIN HYDROUS EX OINT: TOPICAL_OINTMENT | CUTANEOUS | Status: DC | PRN

## 2013-03-17 MED ORDER — SIMETHICONE 80 MG PO CHEW: 80.0000 mg | CHEWABLE_TABLET | ORAL | Status: DC | PRN

## 2013-03-17 MED ORDER — TETANUS-DIPHTH-ACELL PERTUSSIS 5-2.5-18.5 LF-MCG/0.5 IM SUSP
0.5000 mL | Freq: Once | INTRAMUSCULAR | Status: AC
  Administered 2013-03-17: 0.5 mL via INTRAMUSCULAR
  Filled 2013-03-17: qty 0.5

## 2013-03-17 MED ORDER — POTASSIUM CHLORIDE CRYS ER 20 MEQ PO TBCR
40.0000 meq | EXTENDED_RELEASE_TABLET | Freq: Two times a day (BID) | ORAL | Status: DC
  Administered 2013-03-17 – 2013-03-18 (×3): 40 meq via ORAL
  Filled 2013-03-17 (×5): qty 2

## 2013-03-17 MED ORDER — SENNOSIDES-DOCUSATE SODIUM 8.6-50 MG PO TABS
2.0000 | ORAL_TABLET | ORAL | Status: DC
  Administered 2013-03-17: 2 via ORAL
  Filled 2013-03-17: qty 2

## 2013-03-17 MED ORDER — ZOLPIDEM TARTRATE 5 MG PO TABS: 5.0000 mg | ORAL_TABLET | Freq: Every evening | ORAL | Status: DC | PRN

## 2013-03-17 MED ORDER — ONDANSETRON HCL 4 MG/2ML IJ SOLN: 4.0000 mg | INTRAMUSCULAR | Status: DC | PRN

## 2013-03-17 MED ORDER — MAGNESIUM OXIDE 400 (241.3 MG) MG PO TABS
400.0000 mg | ORAL_TABLET | Freq: Every day | ORAL | Status: DC
Start: 2013-03-17 — End: 2013-03-18
  Administered 2013-03-17 – 2013-03-18 (×2): 400 mg via ORAL
  Filled 2013-03-17 (×3): qty 1

## 2013-03-17 NOTE — Progress Notes (Signed)
Postpartum Note Day # 1  S:  Patient resting comfortable in bed.  Pain controlled.  Tolerating general diet. + flatus, no BM.  + ambulation. Voiding freely. She denies n/v/f/c, SOB, or CP.  Pt is breastfeeding.  No headache, no blurry vision, no RUQ pain.  O: BP 119/78  Pulse 101  Temp(Src) 98.2 F (36.8 C) (Oral)  Resp 20  Ht 5\' 8"  (1.727 m)  Wt 91.173 kg (201 lb)  BMI 30.57 kg/m2  SpO2 98%  LMP 06/21/2012 BP range: 119-151/78-85 in past 12hr  Gen: A&Ox3, NAD CV: RRR, no MRG Resp: CTAB Abdomen: soft, NT, ND Uterus: firm, non-tender, @ umbilicus Ext: No edema, no calf tenderness bilaterally  Labs:  CBC    Component Value Date/Time   WBC 19.7* 03/17/2013 0615   WBC 6.0 07/21/2012 1101   RBC 3.69* 03/17/2013 0615   RBC 4.59 07/21/2012 1101   HGB 10.9* 03/17/2013 0615   HGB 12.6 07/21/2012 1101   HCT 31.6* 03/17/2013 0615   HCT 39.4 07/21/2012 1101   PLT 170 03/17/2013 0615   MCV 85.6 03/17/2013 0615   MCV 85.8 07/21/2012 1101   MCH 29.5 03/17/2013 0615   MCH 27.5 07/21/2012 1101   MCHC 34.5 03/17/2013 0615   MCHC 32.0 07/21/2012 1101   RDW 14.0 03/17/2013 0615   AST/ALT: 23/10 BUN/CR: 5/0.64  A/P: Pt is a 27 y.o. G1P1001 s/p NSVD with 3rd degree laceration.  POD #1 -gestational HTN:  BP range as above, no medications indicated  Ruled out for preeclampsia on admit, labs stable as above, await repeat PC ratio -Hypokalemia and Hypomagnesium  -will continue with K and magensium po replacement today  -pt to have repeat labs in am -Dermoid cyst: to be followed as outpatient - Pain well controlled with motrin -GI: Tolerating gen diet.  Encourage Colace and mylicon -Activity: encouraged sitting up to chair and ambulation as tolerated -Prophylaxis: early ambulation -Labs: as above, repeat BMP and Mag in am, await PC ratio -Will plan for baby boy circ this evening  Myna Hidalgo, DO [phone] (pager) [phone] (office)

## 2013-03-18 LAB — BASIC METABOLIC PANEL
BUN: 5 mg/dL — ABNORMAL LOW (ref 6–23)
CO2: 24 mEq/L (ref 19–32)
Chloride: 105 mEq/L (ref 96–112)
Creatinine, Ser: 0.56 mg/dL (ref 0.50–1.10)
GFR calc Af Amer: >90 mL/min (ref >90)
Glucose, Bld: 87 mg/dL (ref 70–99)
Potassium: 3.3 mEq/L — ABNORMAL LOW (ref 3.5–5.1)
Sodium: 138 mEq/L (ref 135–145)

## 2013-03-18 MED ORDER — MAGNESIUM OXIDE 400 (241.3 MG) MG PO TABS: 400.0000 mg | ORAL_TABLET | Freq: Every day | ORAL | Status: DC

## 2013-03-18 MED ORDER — SENNOSIDES-DOCUSATE SODIUM 8.6-50 MG PO TABS: 2.0000 | ORAL_TABLET | ORAL | Status: DC

## 2013-03-18 MED ORDER — POTASSIUM CHLORIDE CRYS ER 20 MEQ PO TBCR: 40.0000 meq | EXTENDED_RELEASE_TABLET | Freq: Every day | ORAL | Status: DC

## 2013-03-18 MED ORDER — IBUPROFEN 600 MG PO TABS: 600.0000 mg | ORAL_TABLET | Freq: Four times a day (QID) | ORAL | Status: DC

## 2013-03-18 NOTE — Discharge Summary (Signed)
Obstetric Discharge Summary Reason for Admission: induction of labor Prenatal Procedures: NST and ultrasound Intrapartum Procedures: spontaneous vaginal delivery, episiotomy with 3rd degree extension, GBS prophylaxis and placement of Foley balloon, AROM, internal monitors (IUPC & FSE) Postpartum Procedures: none Complications-Operative and Postpartum: 3rd degree perineal laceration Hemoglobin  Date Value Range Status  03/17/2013 10.9* 12.0 - 15.0 g/dL Final  1/61/0960 45.4  12.2 - 16.2 g/dL Final     HCT  Date Value Range Status  03/17/2013 31.6* 36.0 - 46.0 % Final     HCT, POC  Date Value Range Status  07/21/2012 39.4  37.7 - 47.9 % Final   Physical Exam:  General: alert Lochia: appropriate Uterine Fundus: firm DVT Evaluation: No evidence of DVT seen on physical exam.   Discharge Diagnoses: Term Pregnancy-delivered, gestational HTN, Hypokalemia, Hypomagensium  Discharge Information: Date: 03/18/2013 Activity: pelvic rest Diet: routine Medications: PNV, Ibuprofen and Colace, Mag Ox and Kdur Condition: stable Instructions: refer to practice specific booklet Discharge to: home Follow-up Information   Follow up with Geryl Rankins, MD. Schedule an appointment as soon as possible for a visit in 6 weeks.   Specialty:  Obstetrics and Gynecology   Contact information:   [address] [place] [address] [phone]       Newborn Data: Live born female  Birth Weight: 7 lb 6.5 oz (3359 g) APGAR: 8, 9  Home with mother.  Myna Hidalgo, M 03/18/2013, 8:07 AM

## 2013-03-18 NOTE — Progress Notes (Signed)
Postpartum Note Day # 2  S:  Patient resting comfortable in bed.  Pain controlled.  Tolerating general diet. + flatus, no BM.  + ambulation. Voiding freely. She denies n/v/f/c, SOB, or CP.  Pt is breastfeeding.  No headache, no blurry vision, no RUQ pain.  Moderate lochia.  O: BP 134/93  Pulse 114  Temp(Src) 98.4 F (36.9 C) (Axillary)  Resp 20  Ht 5\' 8"  (1.727 m)  Wt 91.173 kg (201 lb)  BMI 30.57 kg/m2  SpO2 100%  LMP 06/21/2012 HR range: 93-114 BP range: 107-134/77-93  Gen: A&Ox3, NAD CV: RRR, no tachycardia appreciated on exam Resp: CTAB Abdomen: soft, NT, ND Uterus: firm, non-tender, below umbilicus Ext: minimal non-pitting pedal edema bilaterally, no calf tenderness bilaterally  Labs:  Lab Results  Component Value Date   NA 138 03/18/2013   K 3.3* 03/18/2013   CL 105 03/18/2013   CO2 24 03/18/2013  Mag: 1.2  A/P: Pt is a 27 y.o. G1P1001 s/p NSVD with 3rd degree laceration.  PPD #2 -gestational HTN:  BP range as above, no medications indicated  Ruled out for preeclampsia on admit.  Repeat PC ratio on PPD #1 elevated- suspect due to blood in urine.  Clinically the patient is asymptomatic and BP has remained stable.  No indication for Mag prophylaxis at this time. -Hypokalemia and Hypomagnesium  -will continue with K and magensium po replacement, repeat labs as above  -pt to have repeat BMP and Mag check as outpatient -Dermoid cyst: to be followed as outpatient - Pain well controlled with motrin -GI: Tolerating gen diet.  Encourage Colace and mylicon -Activity: encouraged sitting up to chair and ambulation as tolerated -Prophylaxis: early ambulation  -Baby boy circ completed -Pt meeting postpartum milestone appropriately will plan for discharge home today  Myna Hidalgo, DO [phone] (pager) [phone] (office)

## 2013-03-19 NOTE — Anesthesia Postprocedure Evaluation (Signed)
  Anesthesia Post-op Note  Patient: Tami Crawford  Procedure(s) Performed: Lumbar Epidural for L&D  Complications: No apparent anesthesia complications

## 2013-03-20 ENCOUNTER — Encounter (HOSPITAL_COMMUNITY): Payer: Self-pay | Admitting: *Deleted

## 2013-03-22 NOTE — Progress Notes (Signed)
Post discharge chart review completed.  

## 2013-04-25 ENCOUNTER — Encounter (HOSPITAL_COMMUNITY): Payer: Self-pay

## 2013-04-28 ENCOUNTER — Encounter (HOSPITAL_COMMUNITY)
Admission: RE | Disposition: A | Discharge status: Home / Self Care | Payer: Self-pay | Source: Ambulatory Visit | Attending: Obstetrics and Gynecology

## 2013-04-28 ENCOUNTER — Ambulatory Visit (HOSPITAL_COMMUNITY): Payer: BC Managed Care – PPO | Admitting: Anesthesiology

## 2013-04-28 ENCOUNTER — Ambulatory Visit (HOSPITAL_COMMUNITY)
Admission: RE | Admit: 2013-04-28 | Discharge: 2013-04-28 | Disposition: A | Discharge status: Home / Self Care | Payer: BC Managed Care – PPO | Source: Ambulatory Visit | Attending: Obstetrics and Gynecology | Admitting: Obstetrics and Gynecology

## 2013-04-28 ENCOUNTER — Encounter (HOSPITAL_COMMUNITY): Payer: BC Managed Care – PPO | Admitting: Anesthesiology

## 2013-04-28 DIAGNOSIS — O901 Disruption of perineal obstetric wound: Secondary | ICD-10-CM | POA: Insufficient documentation

## 2013-04-28 HISTORY — PX: PERINEAL LACERATION REPAIR: SHX5389

## 2013-04-28 LAB — CBC
HCT: 36.9 % (ref 36.0–46.0)
Hemoglobin: 13 g/dL (ref 12.0–15.0)
MCH: 29.4 pg (ref 26.0–34.0)
MCV: 83.5 fL (ref 78.0–100.0)
Platelets: 216 10*3/uL (ref 150–400)
RBC: 4.42 MIL/uL (ref 3.87–5.11)

## 2013-04-28 SURGERY — SUTURE REPAIR, LACERATION, PERINEUM
Anesthesia: Spinal

## 2013-04-28 MED ORDER — PROPOFOL 10 MG/ML IV EMUL
INTRAVENOUS | Status: AC
  Filled 2013-04-28: qty 40

## 2013-04-28 MED ORDER — LIDOCAINE-EPINEPHRINE 0.5 %-1:200000 IJ SOLN
INTRAMUSCULAR | Status: AC
  Filled 2013-04-28: qty 1

## 2013-04-28 MED ORDER — CEFAZOLIN SODIUM-DEXTROSE 2-3 GM-% IV SOLR
INTRAVENOUS | Status: AC
  Filled 2013-04-28: qty 50

## 2013-04-28 MED ORDER — LIDOCAINE HCL (CARDIAC) 20 MG/ML IV SOLN
INTRAVENOUS | Status: AC
  Filled 2013-04-28: qty 5

## 2013-04-28 MED ORDER — FENTANYL CITRATE 0.05 MG/ML IJ SOLN: 25.0000 ug | INTRAMUSCULAR | Status: DC | PRN

## 2013-04-28 MED ORDER — ONDANSETRON HCL 4 MG/2ML IJ SOLN
INTRAMUSCULAR | Status: AC
  Filled 2013-04-28: qty 2

## 2013-04-28 MED ORDER — FENTANYL CITRATE 0.05 MG/ML IJ SOLN
INTRAMUSCULAR | Status: DC | PRN
  Administered 2013-04-28 (×3): 50 ug via INTRAVENOUS

## 2013-04-28 MED ORDER — PROPOFOL 10 MG/ML IV BOLUS
INTRAVENOUS | Status: DC | PRN
  Administered 2013-04-28 (×3): 20 mg via INTRAVENOUS
  Administered 2013-04-28: 40 mg via INTRAVENOUS
  Administered 2013-04-28 (×3): 20 mg via INTRAVENOUS
  Administered 2013-04-28: 50 mg via INTRAVENOUS

## 2013-04-28 MED ORDER — OXYCODONE-ACETAMINOPHEN 5-325 MG PO TABS
ORAL_TABLET | ORAL | Status: AC
  Administered 2013-04-28: 1
  Filled 2013-04-28: qty 1

## 2013-04-28 MED ORDER — CEFAZOLIN SODIUM-DEXTROSE 2-3 GM-% IV SOLR
INTRAVENOUS | Status: DC | PRN
  Administered 2013-04-28: 2 g via INTRAVENOUS

## 2013-04-28 MED ORDER — LIDOCAINE HCL 2 % IJ SOLN
INTRAMUSCULAR | Status: AC
  Filled 2013-04-28: qty 20

## 2013-04-28 MED ORDER — ONDANSETRON HCL 4 MG/2ML IJ SOLN
INTRAMUSCULAR | Status: DC | PRN
  Administered 2013-04-28: 4 mg via INTRAVENOUS

## 2013-04-28 MED ORDER — KETOROLAC TROMETHAMINE 30 MG/ML IJ SOLN: 15.0000 mg | Freq: Once | INTRAMUSCULAR | Status: DC | PRN

## 2013-04-28 MED ORDER — LIDOCAINE HCL 2 % IJ SOLN
INTRAMUSCULAR | Status: DC | PRN
  Administered 2013-04-28: 4 mL

## 2013-04-28 MED ORDER — OXYCODONE-ACETAMINOPHEN 5-325 MG PO TABS: 2.0000 | ORAL_TABLET | ORAL | Status: DC | PRN

## 2013-04-28 MED ORDER — KETOROLAC TROMETHAMINE 30 MG/ML IJ SOLN
INTRAMUSCULAR | Status: AC
  Filled 2013-04-28: qty 1

## 2013-04-28 MED ORDER — FENTANYL CITRATE 0.05 MG/ML IJ SOLN
INTRAMUSCULAR | Status: AC
  Filled 2013-04-28: qty 5

## 2013-04-28 MED ORDER — LACTATED RINGERS IV SOLN
INTRAVENOUS | Status: DC
  Administered 2013-04-28 (×2): via INTRAVENOUS

## 2013-04-28 MED ORDER — LIDOCAINE HCL (CARDIAC) 20 MG/ML IV SOLN
INTRAVENOUS | Status: DC | PRN
  Administered 2013-04-28: 40 mg via INTRAVENOUS

## 2013-04-28 MED ORDER — MIDAZOLAM HCL 5 MG/5ML IJ SOLN
INTRAMUSCULAR | Status: DC | PRN
  Administered 2013-04-28: 1 mg via INTRAVENOUS

## 2013-04-28 MED ORDER — MEPERIDINE HCL 25 MG/ML IJ SOLN: 6.2500 mg | INTRAMUSCULAR | Status: DC | PRN

## 2013-04-28 MED ORDER — KETOROLAC TROMETHAMINE 30 MG/ML IJ SOLN
INTRAMUSCULAR | Status: DC | PRN
  Administered 2013-04-28: 30 mg via INTRAVENOUS

## 2013-04-28 MED ORDER — MIDAZOLAM HCL 2 MG/2ML IJ SOLN
INTRAMUSCULAR | Status: AC
  Filled 2013-04-28: qty 2

## 2013-04-28 MED ORDER — METOCLOPRAMIDE HCL 5 MG/ML IJ SOLN: 10.0000 mg | Freq: Once | INTRAMUSCULAR | Status: DC | PRN

## 2013-04-28 SURGICAL SUPPLY — 22 items
CATH ROBINSON RED A/P 16FR (CATHETERS) IMPLANT
CLOTH BEACON ORANGE TIMEOUT ST (SAFETY) ×2 IMPLANT
GAUZE PACKING 2X5 YD STERILE (GAUZE/BANDAGES/DRESSINGS) IMPLANT
GLOVE BIO SURGEON STRL SZ 6 (GLOVE) ×2 IMPLANT
GLOVE BIO SURGEON STRL SZ7 (GLOVE) ×2 IMPLANT
GLOVE BIOGEL PI IND STRL 7.0 (GLOVE) ×1 IMPLANT
GLOVE BIOGEL PI INDICATOR 7.0 (GLOVE) ×1
GOWN PREVENTION PLUS LG XLONG (DISPOSABLE) ×8 IMPLANT
NS IRRIG 1000ML POUR BTL (IV SOLUTION) IMPLANT
PACK VAGINAL WOMENS (CUSTOM PROCEDURE TRAY) ×2 IMPLANT
SUT VIC AB 0 CT1 27 (SUTURE) ×1
SUT VIC AB 0 CT1 27XBRD ANBCTR (SUTURE) ×1 IMPLANT
SUT VIC AB 2-0 CT1 27 (SUTURE)
SUT VIC AB 2-0 CT1 TAPERPNT 27 (SUTURE) IMPLANT
SUT VIC AB 2-0 SH 27 (SUTURE) ×2
SUT VIC AB 2-0 SH 27XBRD (SUTURE) ×2 IMPLANT
SUT VIC AB 2-0 UR5 27 (SUTURE) IMPLANT
SUT VIC AB 3-0 SH 27 (SUTURE) ×1
SUT VIC AB 3-0 SH 27X BRD (SUTURE) ×1 IMPLANT
TOWEL OR 17X24 6PK STRL BLUE (TOWEL DISPOSABLE) ×4 IMPLANT
TRAY FOLEY CATH 14FR (SET/KITS/TRAYS/PACK) IMPLANT
WATER STERILE IRR 1000ML POUR (IV SOLUTION) ×2 IMPLANT

## 2013-04-28 NOTE — H&P (Signed)
  27 y/o G1P1 s/p SVD on 03/16/2013 with 3rd degree laceration presents for routine postpartum check.  Pt without complaints.  Laceration completely opened.    Current Medications  Taking   Se-Natal 19 29-1 MG Tablet 1 tablet Once a day   Integra 62.5-62.5-40-3 MG Capsule as directed Take 1 tablet daily   Past Medical History  Hypokalemia, Hypomagnesemia in pregnancy  Surgical History  Denies Past Surgical History  Family History  per pt no family HX of Cancer.  Gyn History  Sexual activity currently sexually active.  Periods : regular.  LMP 06/21/12.  Birth control none.  Last pap smear date never.  Denies H/O Last mammogram date.  Denies H/O Abnormal pap smear.  Denies H/O STD.   OB History  Pregnancy # 1 current.   Allergies  N.K.D.A.  Review of Systems  Denies fever/chills, chest pain, SOB, headaches, numbness/tingling. No h/o complication with anesthesia, bleeding disorders or blood clots.  Physical Examination  GENERAL:  Patient appears alert and oriented.  General Appearance: well-appearing, well-developed, no acute distress.  Speech: clear.  LUNGS:  Auscultation: no wheezing/rhonchi/rales. CTA bilaterally.  HEART:  Heart sounds: normal. RRR. no murmur.  ABDOMEN:  General: soft nontender, nondistended, no masses.  FEMALE GENITOURINARY:  Pelvic 3rd degree repair open down to anus.  No bleeding, discharge, fould o Sphincter not visualized. Uterus small, nontender, no vaginal bleeding.  EXTREMITIES:  General: No edema or calf tenderness.    Assessments  1. Routine postpartum follow-up - V24.2 (Primary)  2. Pre-op exam - V72.84  3. Third degree perineal laceration - 664.20  4. Contraception - V25.9  5. Hypokalemia - 276.8  6. Hypomagnesemia - 275.2  Treatment  1. Pre-op exam  Notes: R/B/A of procedure discussed with pt at length. All questions answered. Consent obtained.    2. Third degree perineal laceration  Referral To:Geryl Rankins [facility] -  Gynecology Reason:Precert for repair of 3rd degree laceration breakdown    3. Contraception  Start Camila Tablet, 0.35 MG, 1 tablet, Orally, Once a day, 28 day(s), 28, Refills 12   4. Hypokalemia  LAB: Basic Metabolic   5. Hypomagnesemia  LAB: Magnesium, Serum (161096)  Procedures  Venipuncture:  Venipuncture: Judithann Graves 04/24/2013 05:10:04 PM > , performed in right arm.          Labs  Lab: Basic Metabolic  GLUCOSE 100  H 70-99 - mg/dL  BUN 11   0-45 - mg/dL  CREATININE 4.09   8.11-9.14 - mg/dl  eGFR (NON-AFRICAN AMERICAN) 85  >60 - calc  eGFR (AFRICAN AMERICAN) 103  >60 - calc  SODIUM 140   136-145 - mmol/L  POTASSIUM 3.7   3.5-5.5 - mmol/L  CHLORIDE 109  H 98-107 - mmol/L  C02 25   22-32 - mmol/L  ANION GAP 10.0  6.0-20.0 - mmol/L  CALCIUM 9.4   8.6-10.3 - mg/dL  BUN/CR ratio 78.2  95.6-21.3 - calc  Deondrea Aguado B 04/25/2013 08:51:49 AM > Potassium and Magnesium are normal. Allman,Michelle 04/25/2013 10:18:44 AM > Pt informed.        Lab: Magnesium, Serum (086578)  Magnesium, Serum 1.8  1.6-2.6 - MG/DL  Damon Baisch B 46/96/2952 08:51:49 AM > Potassium and Magnesium are normal. Allman,Michelle 04/25/2013 10:18:44 AM > Pt informed.         Procedure Codes  84132 ECL BMP  36415 BLOOD COLLECTION ROUTINE VENIPUNCTURE  99024 POSTOP F U VISIT   Follow Up  2 Weeks post op

## 2013-04-28 NOTE — Interval H&P Note (Signed)
History and Physical Interval Note:  04/28/2013 4:18 PM  Tami Crawford  has presented today for surgery, with the diagnosis of Perineal Laceration - 3rd degree  The various methods of treatment have been discussed with the patient and family. After consideration of risks, benefits and other options for treatment, the patient has consented to  Procedure(s): SUTURE REPAIR PERINEAL LACERATION (N/A) as a surgical intervention .  The patient's history has been reviewed, patient examined, no change in status, stable for surgery.  I have reviewed the patient's chart and labs.  Questions were answered to the patient's satisfaction.     Dion Body, Tami Crawford

## 2013-04-28 NOTE — Transfer of Care (Signed)
Immediate Anesthesia Transfer of Care Note  Patient: Tami Crawford  Procedure(s) Performed: Procedure(s): SUTURE REPAIR PERINEAL LACERATION (N/A)  Patient Location: PACU  Anesthesia Type:MAC  Level of Consciousness: awake, alert  and oriented  Airway & Oxygen Therapy: Patient Spontanous Breathing and Patient connected to nasal cannula oxygen  Post-op Assessment: Report given to PACU RN and Post -op Vital signs reviewed and stable  Post vital signs: stable  Complications: No apparent anesthesia complications

## 2013-04-28 NOTE — Brief Op Note (Signed)
04/28/2013  5:43 PM  PATIENT:  Tami Crawford  27 y.o. female  PRE-OPERATIVE DIAGNOSIS:  Breakdown of Perineal Laceration repair- 3rd degree  POST-OPERATIVE DIAGNOSIS:  Wound breakdown, 2nd degree laceration remaining  PROCEDURE:  Procedure(s): SUTURE REPAIR PERINEAL LACERATION (N/A)  SURGEON:  Surgeon(s) and Role:    * Geryl Rankins, MD - Primary  PHYSICIAN ASSISTANT: None  ASSISTANTS: Technician    ANESTHESIA:   local and MAC  EBL:  Total I/O In: 700 [I.V.:700] Out: 420 [Urine:400; Blood:20]  BLOOD ADMINISTERED:none  DRAINS: I/O cath   LOCAL MEDICATIONS USED:  2 % LIDOCAINE  SPECIMEN:  No Specimen  DISPOSITION OF SPECIMEN:  N/A  COUNTS:  YES  TOURNIQUET:  * No tourniquets in log *  DICTATION: .Other Dictation: Dictation Number [id number]  PLAN OF CARE: Discharge to home after PACU  PATIENT DISPOSITION:  PACU - hemodynamically stable.   Delay start of Pharmacological VTE agent (>24hrs) due to surgical blood loss or risk of bleeding: not applicable

## 2013-04-28 NOTE — Anesthesia Postprocedure Evaluation (Signed)
  Anesthesia Post-op Note Anesthesia Post Note  Patient: Tami Crawford  Procedure(s) Performed: Procedure(s) (LRB): SUTURE REPAIR PERINEAL LACERATION (N/A)  Anesthesia type: MAC  Patient location: PACU  Post pain: Pain level controlled  Post assessment: Post-op Vital signs reviewed  Last Vitals:  Filed Vitals:   04/28/13 1830  BP:   Pulse: 57  Temp: 36.5 C  Resp: 14    Post vital signs: Reviewed  Level of consciousness: sedated  Complications: No apparent anesthesia complications

## 2013-04-28 NOTE — Anesthesia Preprocedure Evaluation (Addendum)
Anesthesia Evaluation  Patient identified by MRN, date of birth, ID band Patient awake    Reviewed: Allergy & Precautions, H&P , NPO status , Patient's Chart, lab work & pertinent test results, reviewed documented beta blocker date and time   History of Anesthesia Complications Negative for: history of anesthetic complications  Airway Mallampati: II TM Distance: >3 FB Neck ROM: full    Dental  (+) Teeth Intact   Pulmonary neg pulmonary ROS,  breath sounds clear to auscultation        Cardiovascular negative cardio ROS  Rhythm:regular Rate:Normal     Neuro/Psych negative neurological ROS  negative psych ROS   GI/Hepatic negative GI ROS, Neg liver ROS,   Endo/Other  negative endocrine ROS  Renal/GU negative Renal ROS  Female GU complaint     Musculoskeletal   Abdominal   Peds  Hematology negative hematology ROS (+)   Anesthesia Other Findings   Reproductive/Obstetrics (+) Breast feeding                            Anesthesia Physical Anesthesia Plan  ASA: II  Anesthesia Plan: MAC   Post-op Pain Management:    Induction:   Airway Management Planned:   Additional Equipment:   Intra-op Plan:   Post-operative Plan:   Informed Consent: I have reviewed the patients History and Physical, chart, labs and discussed the procedure including the risks, benefits and alternatives for the proposed anesthesia with the patient or authorized representative who has indicated his/her understanding and acceptance.     Plan Discussed with: Surgeon and CRNA  Anesthesia Plan Comments: (Spinal vs GA with LMA offered and pt prefers spinal.  Discussed with Dr Dion Body - Dr Dion Body feels case can be done with MAC/local.  Change in plan discussed with patient via [place] interpreter)       Anesthesia Quick Evaluation

## 2013-04-29 NOTE — Op Note (Signed)
NAMEAGNIESZKA, Tami Crawford               ACCOUNT NO.:  [id number]  MEDICAL RECORD NO.:  [id number]  LOCATION:  [facility]                          FACILITY:  [facility]  PHYSICIAN:  Pieter Partridge, MD   DATE OF BIRTH:  11/13/1985  DATE OF PROCEDURE:  04/28/2013 DATE OF DISCHARGE:  04/28/2013                              OPERATIVE REPORT   PREOPERATIVE DIAGNOSIS:  Breakdown of perineal laceration (third degree).  POSTOPERATIVE DIAGNOSIS:  Wound breakdown and second degree laceration remaining.  PROCEDURE:  Suture repair of perineal laceration.  SURGEON:  Pieter Partridge, MD.  ASSISTANT:  Technician.  ANESTHESIA:  Local and MAC.  ESTIMATED BLOOD LOSS:  20.  BLOOD ADMINISTERED:  None.  DRAINS:  I and O cath, local 2% lidocaine.  SPECIMEN:  None.  DISPOSITION:  Patient to PACU hemodynamically stable.  FINDINGS:  Patient had about maybe a little under 1 cm between the anus and the base of the laceration.  The tissue showed no signs of infection.  No foul odor drainage.  It was poor tissue, sutures tore through easily.  PROCEDURE IN DETAIL:  Patient underwent MAC sedation.  She was then placed in the dorsal lithotomy position and her legs were placed above cephalad.  A 2% lidocaine was injected along the margins of the laceration and into the vagina and perineum.  A little bit more was needed throughout the case.  A 0 Vicryl was used to reapproximate the base of the breakdown.  Three interrupted sutures were placed.  The 3-0 Vicryl was then used to close the second-degree portion of the laceration on the vaginal mucosa and then I did a subcuticular stitch on the perineum.  The stitches tore through very easily on the perineal portion of this repair.  So, I went back with a 3-0 Vicryl, and closed up one area, but it again tore through, so I went through and through with a knot on the outside and that helped approximate the incision and resolve the bleeding.  The incision was well  approximated at the time of the procedure.  Prior to the procedure, I made sure I debrided the area with a scalpel until there was bleeding noted on the base of the bed of the laceration.  Hemostasis was achieved at the end and I was pleased with the appearance of the repair.  The patient tolerated the procedure well.     Pieter Partridge, MD     EBV/MEDQ  D:  04/28/2013  T:  04/29/2013  Job:  [id number]

## 2013-05-01 ENCOUNTER — Encounter (HOSPITAL_COMMUNITY): Payer: Self-pay | Admitting: Obstetrics and Gynecology

## 2014-03-05 ENCOUNTER — Encounter (HOSPITAL_COMMUNITY): Payer: Self-pay | Admitting: Obstetrics and Gynecology

## 2014-05-04 NOTE — L&D Delivery Note (Signed)
Delivery Note Pt required a lot of instruction for pushing, but did push well with that for about 30 minutes and at 6:45 AM a healthy female was delivered via Vaginal, Spontaneous Delivery (Presentation: Left Occiput Anterior).  APGAR: 9,9 ; weight pending .   Placenta status: Intact, Spontaneous.  Cord: 3 vessels with the following complications: None.    Anesthesia: Epidural  Episiotomy: None Lacerations: 1st degree Suture Repair: 3.0 vicryl rapide Est. Blood Loss (mL): 157ml  Mom to postpartum.  Baby to Couplet care / Skin to Skin. D/w husband circumcision and he states they would like to wait and do in the office.  Logan Bores 01/26/2015, 7:15 AM

## 2014-06-12 ENCOUNTER — Ambulatory Visit (INDEPENDENT_AMBULATORY_CARE_PROVIDER_SITE_OTHER): Payer: Self-pay | Admitting: Family Medicine

## 2014-06-12 VITALS — BP 119/75 | HR 80 | Temp 98.4°F | Resp 17 | Ht 67.0 in | Wt 189.0 lb

## 2014-06-12 DIAGNOSIS — N910 Primary amenorrhea: Secondary | ICD-10-CM

## 2014-06-12 DIAGNOSIS — N926 Irregular menstruation, unspecified: Secondary | ICD-10-CM

## 2014-06-12 LAB — POCT URINE PREGNANCY: Preg Test, Ur: POSITIVE

## 2014-06-12 NOTE — Progress Notes (Signed)
° °  Subjective:  This chart was scribed for Robyn Haber, MD by Randa Evens, ED Scribe. This Patient was seen in room 04 and the patients care was started at 5:42 PM   Patient ID: Tami Crawford, female    DOB: 1986-04-10, 29 y.o.   MRN: [id number]  Chief Complaint  Patient presents with   Routine Prenatal Visit    HPI HPI Comments: Wilmary Levit is a 29 y.o. female who presents to the [facility] for pregnancy test. Pt states that her LMP was 04/29/2014. Pt states she has some intermittent abdominal pain that's not concerning.  Pt states she has one child that is 15 months. Pt denies nausea or breast tenderness.   Pt states she works at [employer].   Review of Systems  Cardiovascular: Negative for chest pain.  Gastrointestinal: Positive for abdominal pain. Negative for nausea.      Objective:   BP 119/75 mmHg   Pulse 80   Temp(Src) 98.4 F (36.9 C) (Oral)   Resp 17   Ht 5\' 7"  (1.702 m)   Wt 189 lb (85.73 kg)   BMI 29.59 kg/m2   SpO2 100%   LMP 04/29/2014   Physical Exam  Constitutional: She is oriented to person, place, and time. She appears well-developed and well-nourished. No distress.  HENT:  Head: Normocephalic and atraumatic.  Eyes: Conjunctivae and EOM are normal.  Neck: Neck supple. No tracheal deviation present.  Cardiovascular: Normal rate.   Pulmonary/Chest: Effort normal. No respiratory distress.  Musculoskeletal: Normal range of motion.  Neurological: She is alert and oriented to person, place, and time.  Skin: Skin is warm and dry.  Psychiatric: She has a normal mood and affect. Her behavior is normal.  Nursing note and vitals reviewed.   Results for orders placed or performed in visit on 06/12/14  POCT urine pregnancy  Result Value Ref Range   Preg Test, Ur Positive      Assessment & Plan:    I personally performed the services described in this documentation, which was scribed in my presence. The recorded  information has been reviewed and is accurate.  This chart was scribed in my presence and reviewed by me personally.    ICD-9-CM ICD-10-CM   1. Late period 626.8 N91.0 POCT urine pregnancy     Signed, Robyn Haber, MD

## 2014-06-12 NOTE — Addendum Note (Signed)
Addended by: Robyn Haber on: 06/12/2014 06:08 PM   Modules accepted: Level of Service

## 2014-06-12 NOTE — Patient Instructions (Signed)
First Trimester of Pregnancy The first trimester of pregnancy is from week 1 until the end of week 12 (months 1 through 3). A week after a sperm fertilizes an egg, the egg will implant on the wall of the uterus. This embryo will begin to develop into a baby. Genes from you and your partner are forming the baby. The female genes determine whether the baby is a boy or a girl. At 6-8 weeks, the eyes and face are formed, and the heartbeat can be seen on ultrasound. At the end of 12 weeks, all the baby's organs are formed.  Now that you are pregnant, you will want to do everything you can to have a healthy baby. Two of the most important things are to get good prenatal care and to follow your health care provider's instructions. Prenatal care is all the medical care you receive before the baby's birth. This care will help prevent, find, and treat any problems during the pregnancy and childbirth. BODY CHANGES Your body goes through many changes during pregnancy. The changes vary from woman to woman.   You may gain or lose a couple of pounds at first.  You may feel sick to your stomach (nauseous) and throw up (vomit). If the vomiting is uncontrollable, call your health care provider.  You may tire easily.  You may develop headaches that can be relieved by medicines approved by your health care provider.  You may urinate more often. Painful urination may mean you have a bladder infection.  You may develop heartburn as a result of your pregnancy.  You may develop constipation because certain hormones are causing the muscles that push waste through your intestines to slow down.  You may develop hemorrhoids or swollen, bulging veins (varicose veins).  Your breasts may begin to grow larger and become tender. Your nipples may stick out more, and the tissue that surrounds them (areola) may become darker.  Your gums may bleed and may be sensitive to brushing and flossing.  Dark spots or blotches (chloasma,  mask of pregnancy) may develop on your face. This will likely fade after the baby is born.  Your menstrual periods will stop.  You may have a loss of appetite.  You may develop cravings for certain kinds of food.  You may have changes in your emotions from day to day, such as being excited to be pregnant or being concerned that something may go wrong with the pregnancy and baby.  You may have more vivid and strange dreams.  You may have changes in your hair. These can include thickening of your hair, rapid growth, and changes in texture. Some women also have hair loss during or after pregnancy, or hair that feels dry or thin. Your hair will most likely return to normal after your baby is born. WHAT TO EXPECT AT YOUR PRENATAL VISITS During a routine prenatal visit:  You will be weighed to make sure you and the baby are growing normally.  Your blood pressure will be taken.  Your abdomen will be measured to track your baby's growth.  The fetal heartbeat will be listened to starting around week 10 or 12 of your pregnancy.  Test results from any previous visits will be discussed. Your health care provider may ask you:  How you are feeling.  If you are feeling the baby move.  If you have had any abnormal symptoms, such as leaking fluid, bleeding, severe headaches, or abdominal cramping.  If you have any questions. Other tests   that may be performed during your first trimester include:  Blood tests to find your blood type and to check for the presence of any previous infections. They will also be used to check for low iron levels (anemia) and Rh antibodies. Later in the pregnancy, blood tests for diabetes will be done along with other tests if problems develop.  Urine tests to check for infections, diabetes, or protein in the urine.  An ultrasound to confirm the proper growth and development of the baby.  An amniocentesis to check for possible genetic problems.  Fetal screens for  spina bifida and Down syndrome.  You may need other tests to make sure you and the baby are doing well. HOME CARE INSTRUCTIONS  Medicines  Follow your health care provider's instructions regarding medicine use. Specific medicines may be either safe or unsafe to take during pregnancy.  Take your prenatal vitamins as directed.  If you develop constipation, try taking a stool softener if your health care provider approves. Diet  Eat regular, well-balanced meals. Choose a variety of foods, such as meat or vegetable-based protein, fish, milk and low-fat dairy products, vegetables, fruits, and whole grain breads and cereals. Your health care provider will help you determine the amount of weight gain that is right for you.  Avoid raw meat and uncooked cheese. These carry germs that can cause birth defects in the baby.  Eating four or five small meals rather than three large meals a day may help relieve nausea and vomiting. If you start to feel nauseous, eating a few soda crackers can be helpful. Drinking liquids between meals instead of during meals also seems to help nausea and vomiting.  If you develop constipation, eat more high-fiber foods, such as fresh vegetables or fruit and whole grains. Drink enough fluids to keep your urine clear or pale yellow. Activity and Exercise  Exercise only as directed by your health care provider. Exercising will help you:  Control your weight.  Stay in shape.  Be prepared for labor and delivery.  Experiencing pain or cramping in the lower abdomen or low back is a good sign that you should stop exercising. Check with your health care provider before continuing normal exercises.  Try to avoid standing for long periods of time. Move your legs often if you must stand in one place for a long time.  Avoid heavy lifting.  Wear low-heeled shoes, and practice good posture.  You may continue to have sex unless your health care provider directs you  otherwise. Relief of Pain or Discomfort  Wear a good support bra for breast tenderness.   Take warm sitz baths to soothe any pain or discomfort caused by hemorrhoids. Use hemorrhoid cream if your health care provider approves.   Rest with your legs elevated if you have leg cramps or low back pain.  If you develop varicose veins in your legs, wear support hose. Elevate your feet for 15 minutes, 3-4 times a day. Limit salt in your diet. Prenatal Care  Schedule your prenatal visits by the twelfth week of pregnancy. They are usually scheduled monthly at first, then more often in the last 2 months before delivery.  Write down your questions. Take them to your prenatal visits.  Keep all your prenatal visits as directed by your health care provider. Safety  Wear your seat belt at all times when driving.  Make a list of emergency phone numbers, including numbers for family, friends, the hospital, and police and fire departments. General Tips    Ask your health care provider for a referral to a local prenatal education class. Begin classes no later than at the beginning of month 6 of your pregnancy.  Ask for help if you have counseling or nutritional needs during pregnancy. Your health care provider can offer advice or refer you to specialists for help with various needs.  Do not use hot tubs, steam rooms, or saunas.  Do not douche or use tampons or scented sanitary pads.  Do not cross your legs for long periods of time.  Avoid cat litter boxes and soil used by cats. These carry germs that can cause birth defects in the baby and possibly loss of the fetus by miscarriage or stillbirth.  Avoid all smoking, herbs, alcohol, and medicines not prescribed by your health care provider. Chemicals in these affect the formation and growth of the baby.  Schedule a dentist appointment. At home, brush your teeth with a soft toothbrush and be gentle when you floss. SEEK MEDICAL CARE IF:   You have  dizziness.  You have mild pelvic cramps, pelvic pressure, or nagging pain in the abdominal area.  You have persistent nausea, vomiting, or diarrhea.  You have a bad smelling vaginal discharge.  You have pain with urination.  You notice increased swelling in your face, hands, legs, or ankles. SEEK IMMEDIATE MEDICAL CARE IF:   You have a fever.  You are leaking fluid from your vagina.  You have spotting or bleeding from your vagina.  You have severe abdominal cramping or pain.  You have rapid weight gain or loss.  You vomit blood or material that looks like coffee grounds.  You are exposed to German measles and have never had them.  You are exposed to fifth disease or chickenpox.  You develop a severe headache.  You have shortness of breath.  You have any kind of trauma, such as from a fall or a car accident. Document Released: 04/14/2001 Document Revised: 09/04/2013 Document Reviewed: 02/28/2013 [employer] Patient Information 2015 [employer]. This information is not intended to replace advice given to you by your health care provider. Make sure you discuss any questions you have with your health care provider.  

## 2014-07-17 ENCOUNTER — Ambulatory Visit (INDEPENDENT_AMBULATORY_CARE_PROVIDER_SITE_OTHER): Payer: Self-pay | Admitting: Family Medicine

## 2014-07-17 VITALS — BP 90/68 | HR 96 | Temp 98.3°F | Resp 16 | Ht 67.0 in | Wt 187.4 lb

## 2014-07-17 DIAGNOSIS — Z029 Encounter for administrative examinations, unspecified: Secondary | ICD-10-CM

## 2014-07-17 DIAGNOSIS — Z3481 Encounter for supervision of other normal pregnancy, first trimester: Secondary | ICD-10-CM

## 2014-07-17 MED ORDER — METOCLOPRAMIDE HCL 5 MG PO TABS: 5.0000 mg | ORAL_TABLET | Freq: Four times a day (QID) | ORAL | Status: DC | PRN

## 2014-07-17 NOTE — Patient Instructions (Addendum)
With your last menstrual period starting on Apr 29, 2014, this means that your due date is 02/03/2015 which means that you are likely at 11 weeks and 2days gestational age today.  You will start your second trimester on 03/27.   Make sure you are drinking tons of fluid and can take a ginger supplement or ginger pills along with vitamin B6 to help with nausea - if it persists, then try the reglan.   First Trimester of Pregnancy The first trimester of pregnancy is from week 1 until the end of week 12 (months 1 through 3). A week after a sperm fertilizes an egg, the egg will implant on the wall of the uterus. This embryo will begin to develop into a baby. Genes from you and your partner are forming the baby. The female genes determine whether the baby is a boy or a girl. At 6-8 weeks, the eyes and face are formed, and the heartbeat can be seen on ultrasound. At the end of 12 weeks, all the baby's organs are formed.  Now that you are pregnant, you will want to do everything you can to have a healthy baby. Two of the most important things are to get good prenatal care and to follow your health care provider's instructions. Prenatal care is all the medical care you receive before the baby's birth. This care will help prevent, find, and treat any problems during the pregnancy and childbirth. BODY CHANGES Your body goes through many changes during pregnancy. The changes vary from woman to woman.   You may gain or lose a couple of pounds at first.  You may feel sick to your stomach (nauseous) and throw up (vomit). If the vomiting is uncontrollable, call your health care provider.  You may tire easily.  You may develop headaches that can be relieved by medicines approved by your health care provider.  You may urinate more often. Painful urination may mean you have a bladder infection.  You may develop heartburn as a result of your pregnancy.  You may develop constipation because certain hormones are  causing the muscles that push waste through your intestines to slow down.  You may develop hemorrhoids or swollen, bulging veins (varicose veins).  Your breasts may begin to grow larger and become tender. Your nipples may stick out more, and the tissue that surrounds them (areola) may become darker.  Your gums may bleed and may be sensitive to brushing and flossing.  Dark spots or blotches (chloasma, mask of pregnancy) may develop on your face. This will likely fade after the baby is born.  Your menstrual periods will stop.  You may have a loss of appetite.  You may develop cravings for certain kinds of food.  You may have changes in your emotions from day to day, such as being excited to be pregnant or being concerned that something may go wrong with the pregnancy and baby.  You may have more vivid and strange dreams.  You may have changes in your hair. These can include thickening of your hair, rapid growth, and changes in texture. Some women also have hair loss during or after pregnancy, or hair that feels dry or thin. Your hair will most likely return to normal after your baby is born. WHAT TO EXPECT AT YOUR PRENATAL VISITS During a routine prenatal visit:  You will be weighed to make sure you and the baby are growing normally.  Your blood pressure will be taken.  Your abdomen will be measured  to track your baby's growth.  The fetal heartbeat will be listened to starting around week 10 or 12 of your pregnancy.  Test results from any previous visits will be discussed. Your health care provider may ask you:  How you are feeling.  If you are feeling the baby move.  If you have had any abnormal symptoms, such as leaking fluid, bleeding, severe headaches, or abdominal cramping.  If you have any questions. Other tests that may be performed during your first trimester include:  Blood tests to find your blood type and to check for the presence of any previous infections. They  will also be used to check for low iron levels (anemia) and Rh antibodies. Later in the pregnancy, blood tests for diabetes will be done along with other tests if problems develop.  Urine tests to check for infections, diabetes, or protein in the urine.  An ultrasound to confirm the proper growth and development of the baby.  An amniocentesis to check for possible genetic problems.  Fetal screens for spina bifida and Down syndrome.  You may need other tests to make sure you and the baby are doing well. HOME CARE INSTRUCTIONS  Medicines  Follow your health care provider's instructions regarding medicine use. Specific medicines may be either safe or unsafe to take during pregnancy.  Take your prenatal vitamins as directed.  If you develop constipation, try taking a stool softener if your health care provider approves. Diet  Eat regular, well-balanced meals. Choose a variety of foods, such as meat or vegetable-based protein, fish, milk and low-fat dairy products, vegetables, fruits, and whole grain breads and cereals. Your health care provider will help you determine the amount of weight gain that is right for you.  Avoid raw meat and uncooked cheese. These carry germs that can cause birth defects in the baby.  Eating four or five small meals rather than three large meals a day may help relieve nausea and vomiting. If you start to feel nauseous, eating a few soda crackers can be helpful. Drinking liquids between meals instead of during meals also seems to help nausea and vomiting.  If you develop constipation, eat more high-fiber foods, such as fresh vegetables or fruit and whole grains. Drink enough fluids to keep your urine clear or pale yellow. Activity and Exercise  Exercise only as directed by your health care provider. Exercising will help you:  Control your weight.  Stay in shape.  Be prepared for labor and delivery.  Experiencing pain or cramping in the lower abdomen or low  back is a good sign that you should stop exercising. Check with your health care provider before continuing normal exercises.  Try to avoid standing for long periods of time. Move your legs often if you must stand in one place for a long time.  Avoid heavy lifting.  Wear low-heeled shoes, and practice good posture.  You may continue to have sex unless your health care provider directs you otherwise. Relief of Pain or Discomfort  Wear a good support bra for breast tenderness.   Take warm sitz baths to soothe any pain or discomfort caused by hemorrhoids. Use hemorrhoid cream if your health care provider approves.   Rest with your legs elevated if you have leg cramps or low back pain.  If you develop varicose veins in your legs, wear support hose. Elevate your feet for 15 minutes, 3-4 times a day. Limit salt in your diet. Prenatal Care  Schedule your prenatal visits by the  twelfth week of pregnancy. They are usually scheduled monthly at first, then more often in the last 2 months before delivery.  Write down your questions. Take them to your prenatal visits.  Keep all your prenatal visits as directed by your health care provider. Safety  Wear your seat belt at all times when driving.  Make a list of emergency phone numbers, including numbers for family, friends, the hospital, and police and fire departments. General Tips  Ask your health care provider for a referral to a local prenatal education class. Begin classes no later than at the beginning of month 6 of your pregnancy.  Ask for help if you have counseling or nutritional needs during pregnancy. Your health care provider can offer advice or refer you to specialists for help with various needs.  Do not use hot tubs, steam rooms, or saunas.  Do not douche or use tampons or scented sanitary pads.  Do not cross your legs for long periods of time.  Avoid cat litter boxes and soil used by cats. These carry germs that can cause  birth defects in the baby and possibly loss of the fetus by miscarriage or stillbirth.  Avoid all smoking, herbs, alcohol, and medicines not prescribed by your health care provider. Chemicals in these affect the formation and growth of the baby.  Schedule a dentist appointment. At home, brush your teeth with a soft toothbrush and be gentle when you floss. SEEK MEDICAL CARE IF:   You have dizziness.  You have mild pelvic cramps, pelvic pressure, or nagging pain in the abdominal area.  You have persistent nausea, vomiting, or diarrhea.  You have a bad smelling vaginal discharge.  You have pain with urination.  You notice increased swelling in your face, hands, legs, or ankles. SEEK IMMEDIATE MEDICAL CARE IF:   You have a fever.  You are leaking fluid from your vagina.  You have spotting or bleeding from your vagina.  You have severe abdominal cramping or pain.  You have rapid weight gain or loss.  You vomit blood or material that looks like coffee grounds.  You are exposed to [place] measles and have never had them.  You are exposed to fifth disease or chickenpox.  You develop a severe headache.  You have shortness of breath.  You have any kind of trauma, such as from a fall or a car accident. Document Released: 04/14/2001 Document Revised: 09/04/2013 Document Reviewed: 02/28/2013 [facility] Patient Information 2015 [place], [place]. This information is not intended to replace advice given to you by your health care provider. Make sure you discuss any questions you have with your health care provider. Medicines During Pregnancy During pregnancy, there are medicines that are either safe or unsafe to take. Medicines include prescriptions from your caregiver, over-the-counter medicines, topical creams applied to the skin, and all herbal substances. Medicines are put into either Class A, B, C, or D. Class A and B medicines have been shown to be safe in pregnancy. Class C medicines  are also considered to be safe in pregnancy, but these medicines should only be used when necessary. Class D medicines should not be used at all in pregnancy. They can be harmful to a baby.  It is best to take as little medicine as possible while pregnant. However, some medicines are necessary to take for the mother and baby's health. Sometimes, it is more dangerous to stop taking certain medicines than to stay on them. This is often the case for people with long-term (  chronic) conditions such as asthma, diabetes, or high blood pressure (hypertension). If you are pregnant and have a chronic illness, call your caregiver right away. Bring a list of your medicines and their doses to your appointments. If you are planning to become pregnant, schedule a doctor's appointment and discuss your medicines with your caregiver. Lastly, write down the phone number to your pharmacist. They can answer questions regarding a medicine's class and safety. They cannot give advice as to whether you should or should not be on a medicine.  SAFE AND UNSAFE MEDICINES There is a long list of medicines that are considered safe in pregnancy. Below is a shorter list. For specific medicines, ask your caregiver.  AllergyMedicines Loratadine, cetirizine, and chlorpheniramine are safe to take. Certain nasal steroid sprays are safe. Talk to your caregiver about specific brands that are safe. Analgesics Acetaminophen and acetaminophen with codeine are safe to take. All other nonsteroidal anti-inflammatory drugs (NSAIDS) are not safe. This includes ibuprofen.  Antacids Many over-the-counter antacids are safe to take. Talk to your caregiver about specific brands that are safe. Famotidine, ranitidine, and lansoprazole are safe. Omepresole is considered safe to take in the second trimester. Antibiotic Medicines There are several antibiotics to avoid. These include, but are not limited to, tetracyline, quinolones, and sulfa medications.  Talk to your caregiver before taking any antibiotic.  Antihistamines Talk to your caregiver about specific brands that are safe.  Asthma Medicines Most asthma steroid inhalers are safe to take. Talk to your caregiver for specific details. Calcium Calcium supplements are safe to take. Do not take oyster shell calcium.  Cough and Cold Medicines It is safe to take products with guaifenesin or dextromethorphan. Talk to your caregiver about specific brands that are safe. It is not safe to take products that contain aspirin or ibuprofen. Decongestant Medicines Pseudoephedrine-containing products are safe to take in the second and third trimester.  Depression Medicines Talk about these medicines with your caregiver.  Antidiarrheal Medicines It is safe to take loperamide. Talk to your caregiver about specific brands that are safe. It is not safe to take any antidiarrheal medicine that contains bismuth. Eyedrops Allergy eyedrops should be limited.  Iron It is safe to use certain iron-containing medicines for anemia in pregnancy. They require a prescription.  Antinausea Medicines It is safe to take doxylamine and vitamin B6 as directed. There are other prescription medicines available, if needed.  Sleep aids It is safe to take diphenhydramine and acetaminophen with diphenhydramine.  Steroids Hydrocortisone creams are safe to use as directed. Oral steroids require a prescription. It is not safe to take any hemorrhoid cream with pramoxine or phenylephrine. Stool softener It is safe to take stool softener medicines. Avoid daily or prolonged use of stool softeners. Thyroid Medicine It is important to stay on this thyroid medicine. It needs to be followed by your caregiver.  Vaginal Medicines Your caregiver will prescribe a medicine to you if you have a vaginal infection. Certain antifungal medicines are safe to use if you have a sexually transmitted infection (STI). Talk to your  caregiver.  Document Released: 04/20/2005 Document Revised: 07/13/2011 Document Reviewed: 04/21/2011 [facility] Patient Information 2015 [place], [place]. This information is not intended to replace advice given to you by your health care provider. Make sure you discuss any questions you have with your health care provider.  What Do I Need to Know About Injuries During Pregnancy? Trauma is the most common cause of injury and death in pregnant women. This can also result  in significant harm or death of the baby. Your baby is protected in the womb (uterus) by a sac filled with fluid (amniotic sac). Your baby can be harmed if there is direct, high-impact trauma to your abdomen and pelvis. This type of trauma can result in tearing of your uterus, the placenta pulling away from the wall of the uterus (placenta abruption), or the amniotic sac breaking open (rupture of membranes). These injuries can decrease or stop the blood supply to your baby or cause you to go into labor earlier than expected. Minor falls and low-impact automobile accidents do not usually harm your baby, even if they do minimally harm you. WHAT KIND OF INJURIES CAN AFFECT MY PREGNANCY? The most common causes of injury or death to a baby include:  Falls. Falls are more common in the second and third trimester of the pregnancy. Factors that increase your risk of falling include:  Increase in your weight.  The change in your center of gravity.  Tripping over an object that cannot be seen.  Increased looseness (laxity) of your ligaments resulting in less coordinated movements (you may feel clumsy).  Falling during high-risk activities like horseback riding or skiing.  Automobile accidents. It is important to wear your seat belt properly, with the lap belt below your abdomen, and always practice safe driving.  Domestic violence or assault.  Burns (Building services engineer). The most common causes of injury or death to the pregnant woman  include:  Injuries that cause severe bleeding, shock, and loss of blood flow to major organs.  Head and neck injuries that result in severe brain or spinal damage.  Chest trauma that can cause direct injury to the heart and lungs or any injury that affects the area enclosed by the ribs. Trauma to this area can result in cardiorespiratory arrest. WHAT CAN I DO TO PROTECT MYSELF AND MY BABY FROM INJURY WHILE I AM PREGNANT?  Remove slippery rugs and loose objects on the floor that increase your risk of tripping.  Avoid walking on wet or slippery floors.  Wear comfortable shoes that have a good grip on the sole. Do not wear high-heeled shoes.  Always wear your seat belt properly, with the lap belt below your abdomen, and always practice safe driving. Do not ride on a motorcycle while pregnant.  Do not participate in high-impact activities or sports.  Avoid fires, starting fires, lifting heavy pots of boiling or hot liquids, and fixing electrical problems.  Only take over-the-counter or prescription medicines for pain, fever, or discomfort as directed by your health care provider.  Know your blood type and the father's blood type in case you develop vaginal bleeding or experience an injury for which a blood transfusion may be necessary.  Call your local emergency services (911 in the [place] if you are a victim of domestic violence or assault. Spousal abuse can be a significant cause of trauma during pregnancy. For help and support, contact the [employer]. WHEN SHOULD I SEEK IMMEDIATE MEDICAL CARE?   You fall on your abdomen or experience any high-force accident or injury.  You have been assaulted (domestic or otherwise).  You have been in a car accident.  You develop vaginal bleeding.  You develop fluid leaking from the vagina.  You develop uterine contractions (pelvic cramping, pain, or significant low back pain).  You become weak or faint, or have  uncontrolled vomiting after trauma.  You had a serious burn. This includes burns to the face, neck,  hands, or genitals, or burns greater than the size of your palm anywhere else.  You develop neck stiffness or pain after a fall or from other trauma.  You develop a headache or vision problems after a fall or from other trauma.  You do not feel the baby moving or the baby is not moving as much as before a fall or other trauma. Document Released: 05/28/2004 Document Revised: 09/04/2013 Document Reviewed: 01/25/2013 [facility] Patient Information 2015 [place], [place]. This information is not intended to replace advice given to you by your health care provider. Make sure you discuss any questions you have with your health care provider.

## 2014-07-17 NOTE — Progress Notes (Signed)
Subjective:    Patient ID: Tami Crawford, female    DOB: [birth year], 29 y.o.   MRN: [id number] This chart was scribed for Delman Cheadle, MD by Zola Button, Medical Scribe. This patient was seen in Room 13 and the patient's care was started at 4:37 PM.   Chief Complaint  Patient presents with  . Follow-up    Pregnancy/ need note for work     HPI HPI Comments: Tami Crawford is a 29 y.o. female who presents to the [facility] for a follow-up. She was seen here 1 month ago for a pregnancy test that resulted positive. Patient wants a letter confirming positive pregnancy. This is her second pregnancy. She has been having occasional lightheadedness, nausea and vomiting. She has not been taking any medications for this. Patient has been drinking water and taking prenatal vitamins. She has also been eating well and has not had any bowel or urinary problems. Patient denies vaginal discharge, vaginal bleeding and headache. She also denies any problems with her first pregnancy. She was not given a due date for her pregnancy. Her LNMP was 04/29/14. Her first child is 53 months old.   Past Medical History  Diagnosis Date  . Medical history non-contributory    No current outpatient prescriptions on file prior to visit.   No current facility-administered medications on file prior to visit.   No Known Allergies   Review of Systems  Gastrointestinal: Positive for nausea and vomiting. Negative for diarrhea and constipation.  Genitourinary: Negative for dysuria, frequency, hematuria, vaginal bleeding, vaginal discharge and difficulty urinating.  Neurological: Positive for light-headedness. Negative for headaches.       Objective:  BP 90/68 mmHg  Pulse 96  Temp(Src) 98.3 F (36.8 C) (Oral)  Resp 16  Ht 5\' 7"  (1.702 m)  Wt 187 lb 6.4 oz (85.004 kg)  BMI 29.34 kg/m2  SpO2 98%  LMP 04/29/2014  Physical Exam  Constitutional: She is oriented to person, place, and time.  She appears well-developed and well-nourished. No distress.  HENT:  Head: Normocephalic and atraumatic.  Mouth/Throat: Oropharynx is clear and moist. No oropharyngeal exudate.  Eyes: Pupils are equal, round, and reactive to light.  Neck: Neck supple.  Cardiovascular: Normal rate, regular rhythm, S1 normal, S2 normal and normal heart sounds.   No murmur heard. Pulmonary/Chest: Effort normal and breath sounds normal. No respiratory distress. She has no wheezes. She has no rales.  Abdominal: Soft. Bowel sounds are normal. She exhibits no distension. There is no tenderness. There is no rebound, no guarding and no CVA tenderness.  Firm uterus felt below umbilicus above pubis.  Musculoskeletal: She exhibits no edema.  Neurological: She is alert and oriented to person, place, and time. No cranial nerve deficit.  Skin: Skin is warm and dry. No rash noted.  Psychiatric: She has a normal mood and affect. Her behavior is normal.  Nursing note and vitals reviewed.         Assessment & Plan:   Encounter for supervision of other normal pregnancy in first trimester Prev diagnosis - pt needs note for work stating it is ok for her to return to normal duties which I provided as pt and her husband states that [place] works at [employer] doing routine, repetitive actions without any heavy lifing, straining, bending, etc. Reviewed diet and exercise and need for PNV w/ adequat folic acid, reviewed otc meds that she may be able to use prn but reminded to check with pharmacist.  Pt  will est w/ gyn asap.  Meds ordered this encounter  Medications  . metoCLOPramide (REGLAN) 5 MG tablet    Sig: Take 1 tablet (5 mg total) by mouth every 6 (six) hours as needed for nausea or vomiting.    Dispense:  40 tablet    Refill:  1    I personally performed the services described in this documentation, which was scribed in my presence. The recorded information has been reviewed and considered, and addended by me as needed.    Delman Cheadle, MD MPH

## 2014-07-18 ENCOUNTER — Telehealth: Payer: Self-pay

## 2014-07-18 NOTE — Telephone Encounter (Signed)
SHAW OR LAUENSTEIN - Pt was seen on February 9 and March 15 this year.  Her work is requiring a note covering her absence during this time.  [phone]

## 2014-07-19 NOTE — Telephone Encounter (Signed)
Letters printed, put in pick up draw. I tried to call both numbers. Unable to leave a message.

## 2014-09-05 LAB — OB RESULTS CONSOLE GC/CHLAMYDIA
Chlamydia: NEGATIVE
Gonorrhea: NEGATIVE

## 2014-09-05 LAB — OB RESULTS CONSOLE ABO/RH: RH TYPE: POSITIVE

## 2014-09-05 LAB — OB RESULTS CONSOLE RPR: RPR: NONREACTIVE

## 2014-09-05 LAB — OB RESULTS CONSOLE HIV ANTIBODY (ROUTINE TESTING): HIV: NONREACTIVE

## 2014-09-05 LAB — OB RESULTS CONSOLE ANTIBODY SCREEN: Antibody Screen: NEGATIVE

## 2014-09-05 LAB — OB RESULTS CONSOLE RUBELLA ANTIBODY, IGM: Rubella: IMMUNE

## 2014-09-05 LAB — OB RESULTS CONSOLE HEPATITIS B SURFACE ANTIGEN: HEP B S AG: NEGATIVE

## 2015-01-01 LAB — OB RESULTS CONSOLE GBS: STREP GROUP B AG: POSITIVE

## 2015-01-26 ENCOUNTER — Encounter (HOSPITAL_COMMUNITY): Payer: Self-pay | Admitting: *Deleted

## 2015-01-26 ENCOUNTER — Inpatient Hospital Stay (HOSPITAL_COMMUNITY): Payer: Medicaid Other | Admitting: Anesthesiology

## 2015-01-26 ENCOUNTER — Inpatient Hospital Stay (HOSPITAL_COMMUNITY)
Admission: AD | Admit: 2015-01-26 | Discharge: 2015-01-28 | DRG: 775 | Disposition: A | Discharge status: Home / Self Care | Payer: Medicaid Other | Source: Ambulatory Visit | Attending: Obstetrics and Gynecology | Admitting: Obstetrics and Gynecology

## 2015-01-26 DIAGNOSIS — O99214 Obesity complicating childbirth: Secondary | ICD-10-CM | POA: Diagnosis present

## 2015-01-26 DIAGNOSIS — O99824 Streptococcus B carrier state complicating childbirth: Secondary | ICD-10-CM | POA: Diagnosis present

## 2015-01-26 DIAGNOSIS — Z6834 Body mass index (BMI) 34.0-34.9, adult: Secondary | ICD-10-CM

## 2015-01-26 DIAGNOSIS — Z3A38 38 weeks gestation of pregnancy: Secondary | ICD-10-CM | POA: Diagnosis present

## 2015-01-26 LAB — TYPE AND SCREEN
ABO/RH(D): O POS
Antibody Screen: NEGATIVE

## 2015-01-26 LAB — CBC
HCT: 37.8 % (ref 36.0–46.0)
HEMOGLOBIN: 12.6 g/dL (ref 12.0–15.0)
MCH: 28.8 pg (ref 26.0–34.0)
MCHC: 33.3 g/dL (ref 30.0–36.0)
MCV: 86.5 fL (ref 78.0–100.0)
PLATELETS: 155 10*3/uL (ref 150–400)
RBC: 4.37 MIL/uL (ref 3.87–5.11)
RDW: 14 % (ref 11.5–15.5)
WBC: 7.5 10*3/uL (ref 4.0–10.5)

## 2015-01-26 LAB — RPR: RPR: NONREACTIVE

## 2015-01-26 MED ORDER — IBUPROFEN 600 MG PO TABS
600.0000 mg | ORAL_TABLET | Freq: Four times a day (QID) | ORAL | Status: DC
  Administered 2015-01-26 – 2015-01-28 (×8): 600 mg via ORAL
  Filled 2015-01-26 (×8): qty 1

## 2015-01-26 MED ORDER — ZOLPIDEM TARTRATE 5 MG PO TABS: 5.0000 mg | ORAL_TABLET | Freq: Every evening | ORAL | Status: DC | PRN

## 2015-01-26 MED ORDER — DIPHENHYDRAMINE HCL 50 MG/ML IJ SOLN: 12.5000 mg | INTRAMUSCULAR | Status: DC | PRN

## 2015-01-26 MED ORDER — FENTANYL 2.5 MCG/ML BUPIVACAINE 1/10 % EPIDURAL INFUSION (WH - ANES)
14.0000 mL/h | INTRAMUSCULAR | Status: DC | PRN
  Administered 2015-01-26 (×2): 14 mL/h via EPIDURAL
  Filled 2015-01-26: qty 125

## 2015-01-26 MED ORDER — ONDANSETRON HCL 4 MG PO TABS: 4.0000 mg | ORAL_TABLET | ORAL | Status: DC | PRN

## 2015-01-26 MED ORDER — ACETAMINOPHEN 325 MG PO TABS: 650.0000 mg | ORAL_TABLET | ORAL | Status: DC | PRN

## 2015-01-26 MED ORDER — OXYCODONE-ACETAMINOPHEN 5-325 MG PO TABS
1.0000 | ORAL_TABLET | ORAL | Status: DC | PRN
  Administered 2015-01-27 (×2): 1 via ORAL
  Filled 2015-01-26 (×2): qty 1

## 2015-01-26 MED ORDER — INFLUENZA VAC SPLIT QUAD 0.5 ML IM SUSY
0.5000 mL | PREFILLED_SYRINGE | INTRAMUSCULAR | Status: AC
  Administered 2015-01-27: 0.5 mL via INTRAMUSCULAR

## 2015-01-26 MED ORDER — ACETAMINOPHEN 325 MG PO TABS
650.0000 mg | ORAL_TABLET | ORAL | Status: DC | PRN
Start: 2015-01-26 — End: 2015-01-28

## 2015-01-26 MED ORDER — LIDOCAINE HCL (PF) 1 % IJ SOLN
30.0000 mL | INTRAMUSCULAR | Status: DC | PRN
  Filled 2015-01-26: qty 30

## 2015-01-26 MED ORDER — LACTATED RINGERS IV SOLN
500.0000 mL | INTRAVENOUS | Status: DC | PRN
  Administered 2015-01-26: 1000 mL via INTRAVENOUS

## 2015-01-26 MED ORDER — LACTATED RINGERS IV SOLN
INTRAVENOUS | Status: DC
  Administered 2015-01-26: 05:00:00 via INTRAVENOUS

## 2015-01-26 MED ORDER — DIPHENHYDRAMINE HCL 25 MG PO CAPS: 25.0000 mg | ORAL_CAPSULE | Freq: Four times a day (QID) | ORAL | Status: DC | PRN

## 2015-01-26 MED ORDER — OXYCODONE-ACETAMINOPHEN 5-325 MG PO TABS: 2.0000 | ORAL_TABLET | ORAL | Status: DC | PRN

## 2015-01-26 MED ORDER — OXYTOCIN 40 UNITS IN LACTATED RINGERS INFUSION - SIMPLE MED
62.5000 mL/h | INTRAVENOUS | Status: DC
  Administered 2015-01-26: 62.5 mL/h via INTRAVENOUS
  Filled 2015-01-26: qty 1000

## 2015-01-26 MED ORDER — SIMETHICONE 80 MG PO CHEW: 80.0000 mg | CHEWABLE_TABLET | ORAL | Status: DC | PRN

## 2015-01-26 MED ORDER — WITCH HAZEL-GLYCERIN EX PADS: 1.0000 "application " | MEDICATED_PAD | CUTANEOUS | Status: DC | PRN

## 2015-01-26 MED ORDER — SENNOSIDES-DOCUSATE SODIUM 8.6-50 MG PO TABS
2.0000 | ORAL_TABLET | ORAL | Status: DC
  Administered 2015-01-26 – 2015-01-27 (×2): 2 via ORAL
  Filled 2015-01-26 (×2): qty 2

## 2015-01-26 MED ORDER — BENZOCAINE-MENTHOL 20-0.5 % EX AERO: 1.0000 "application " | INHALATION_SPRAY | CUTANEOUS | Status: DC | PRN

## 2015-01-26 MED ORDER — OXYTOCIN BOLUS FROM INFUSION
500.0000 mL | INTRAVENOUS | Status: DC
  Administered 2015-01-26: 500 mL via INTRAVENOUS

## 2015-01-26 MED ORDER — ONDANSETRON HCL 4 MG/2ML IJ SOLN: 4.0000 mg | INTRAMUSCULAR | Status: DC | PRN

## 2015-01-26 MED ORDER — PENICILLIN G POTASSIUM 5000000 UNITS IJ SOLR
2.5000 10*6.[IU] | INTRAMUSCULAR | Status: DC
  Filled 2015-01-26 (×3): qty 2.5

## 2015-01-26 MED ORDER — FLEET ENEMA 7-19 GM/118ML RE ENEM: 1.0000 | ENEMA | RECTAL | Status: DC | PRN

## 2015-01-26 MED ORDER — OXYCODONE-ACETAMINOPHEN 5-325 MG PO TABS: 1.0000 | ORAL_TABLET | ORAL | Status: DC | PRN

## 2015-01-26 MED ORDER — EPHEDRINE 5 MG/ML INJ
10.0000 mg | INTRAVENOUS | Status: DC | PRN
  Filled 2015-01-26: qty 2

## 2015-01-26 MED ORDER — PRENATAL MULTIVITAMIN CH
1.0000 | ORAL_TABLET | Freq: Every day | ORAL | Status: DC
  Administered 2015-01-26 – 2015-01-27 (×2): 1 via ORAL
  Filled 2015-01-26 (×2): qty 1

## 2015-01-26 MED ORDER — LIDOCAINE-EPINEPHRINE (PF) 2 %-1:200000 IJ SOLN
INTRAMUSCULAR | Status: DC | PRN
  Administered 2015-01-26: 4 mL

## 2015-01-26 MED ORDER — ONDANSETRON HCL 4 MG/2ML IJ SOLN: 4.0000 mg | Freq: Four times a day (QID) | INTRAMUSCULAR | Status: DC | PRN

## 2015-01-26 MED ORDER — DIBUCAINE 1 % RE OINT: 1.0000 "application " | TOPICAL_OINTMENT | RECTAL | Status: DC | PRN

## 2015-01-26 MED ORDER — BUPIVACAINE HCL (PF) 0.25 % IJ SOLN
INTRAMUSCULAR | Status: DC | PRN
  Administered 2015-01-26 (×2): 4 mL via EPIDURAL

## 2015-01-26 MED ORDER — LANOLIN HYDROUS EX OINT: TOPICAL_OINTMENT | CUTANEOUS | Status: DC | PRN

## 2015-01-26 MED ORDER — TETANUS-DIPHTH-ACELL PERTUSSIS 5-2.5-18.5 LF-MCG/0.5 IM SUSP: 0.5000 mL | Freq: Once | INTRAMUSCULAR | Status: DC

## 2015-01-26 MED ORDER — PHENYLEPHRINE 40 MCG/ML (10ML) SYRINGE FOR IV PUSH (FOR BLOOD PRESSURE SUPPORT)
80.0000 ug | PREFILLED_SYRINGE | INTRAVENOUS | Status: DC | PRN
  Filled 2015-01-26: qty 2
  Filled 2015-01-26: qty 20

## 2015-01-26 MED ORDER — DEXTROSE 5 % IV SOLN
5.0000 10*6.[IU] | Freq: Once | INTRAVENOUS | Status: DC
  Filled 2015-01-26: qty 5

## 2015-01-26 MED ORDER — CITRIC ACID-SODIUM CITRATE 334-500 MG/5ML PO SOLN: 30.0000 mL | ORAL | Status: DC | PRN

## 2015-01-26 NOTE — Anesthesia Postprocedure Evaluation (Signed)
Anesthesia Post Note  Patient: Tami Crawford  Procedure(s) Performed: * No procedures listed *  Anesthesia type: Epidural  Patient location: Mother/Baby  Post pain: Pain level controlled  Post assessment: Post-op Vital signs reviewed  Last Vitals:  Filed Vitals:   01/26/15 0830  BP: 126/68  Pulse: 81  Temp: 37.1 C  Resp: 18    Post vital signs: Reviewed  Level of consciousness:alert  Complications: No apparent anesthesia complications

## 2015-01-26 NOTE — MAU Note (Signed)
Contractions since yesterday.  No leaking . No bleeding. Baby been moving well.

## 2015-01-26 NOTE — Anesthesia Procedure Notes (Signed)
Epidural Patient location during procedure: OB  Staffing Anesthesiologist: MOSER, CHRISTOPHER Performed by: anesthesiologist   Preanesthetic Checklist Completed: patient identified, surgical consent, pre-op evaluation, timeout performed, IV checked, risks and benefits discussed and monitors and equipment checked  Epidural Patient position: sitting Prep: DuraPrep Patient monitoring: heart rate, cardiac monitor, continuous pulse ox and blood pressure Approach: midline Location: L3-L4 Injection technique: LOR saline  Needle:  Needle type: Tuohy  Needle gauge: 17 G Needle length: 9 cm Needle insertion depth: 7 cm Catheter type: closed end flexible Catheter size: 19 Gauge Catheter at skin depth: 12 cm Test dose: negative and 2% lidocaine with Epi 1:200 K  Assessment Events: blood not aspirated, injection not painful, no injection resistance, negative IV test and no paresthesia  Additional Notes Reason for block:procedure for pain

## 2015-01-26 NOTE — Lactation Note (Signed)
This note was copied from the chart of [facility]. Lactation Consultation Note  Baby is 10 hours of life and has only eaten once. RN was at bedside attempting to bottle feed formula. Baby's eyes were wide open but he was not interested in eating. Mom taught hand expression with colostrum visible.  Handouts given on support groups and outpatient services. Denies any questions at this point. Follow-up if needed. Patient Name: Tami Crawford POLID'C Date: 01/26/2015 Reason for consult: Initial assessment   Maternal Data Has patient been taught Hand Expression?: Yes Does the patient have breastfeeding experience prior to this delivery?: Yes  Feeding Feeding Type: Bottle Fed - Formula  LATCH Score/Interventions                      Lactation Tools Discussed/Used     Consult Status Consult Status: PRN Date: 01/26/15 Follow-up type: In-patient    Van Clines 01/26/2015, 4:52 PM

## 2015-01-26 NOTE — Anesthesia Preprocedure Evaluation (Signed)
Anesthesia Evaluation  Patient identified by MRN, date of birth, ID band Patient awake    Reviewed: Allergy & Precautions, NPO status , Patient's Chart, lab work & pertinent test results  History of Anesthesia Complications Negative for: history of anesthetic complications  Airway Mallampati: II  TM Distance: >3 FB Neck ROM: Full    Dental  (+) Teeth Intact   Pulmonary neg pulmonary ROS,    breath sounds clear to auscultation       Cardiovascular negative cardio ROS   Rhythm:Regular     Neuro/Psych negative neurological ROS  negative psych ROS   GI/Hepatic negative GI ROS, Neg liver ROS,   Endo/Other  Morbid obesity  Renal/GU negative Renal ROS     Musculoskeletal   Abdominal   Peds  Hematology negative hematology ROS (+)   Anesthesia Other Findings   Reproductive/Obstetrics (+) Pregnancy                             Anesthesia Physical Anesthesia Plan  ASA: II  Anesthesia Plan: Epidural   Post-op Pain Management:    Induction:   Airway Management Planned:   Additional Equipment:   Intra-op Plan:   Post-operative Plan:   Informed Consent: I have reviewed the patients History and Physical, chart, labs and discussed the procedure including the risks, benefits and alternatives for the proposed anesthesia with the patient or authorized representative who has indicated his/her understanding and acceptance.   Dental advisory given  Plan Discussed with: Anesthesiologist  Anesthesia Plan Comments:         Anesthesia Quick Evaluation

## 2015-01-26 NOTE — Progress Notes (Signed)
Limited amount of questions asked due to pt very uncomfortable.

## 2015-01-26 NOTE — H&P (Signed)
Tami Crawford is a 29 y.o. female G2P1001 at 38+weeks (EDD 02/03/15 by LMP c/w 16 week [place]) presenting for active labor.  Pt was admitted at 4-5cm and no ROM.  Prenatal care complicated by late start at  16 weeks.  GBS positive.  No other concerns  Maternal Medical History:  Reason for admission: Contractions.   Contractions: Onset was 3-5 hours ago.   Frequency: regular.   Perceived severity is strong.    Fetal activity: Perceived fetal activity is normal.      OB History    Gravida Para Term Preterm AB TAB SAB Ectopic Multiple Living   2 1 1       1     2014 NSVD 6#7oz  Past Medical History  Diagnosis Date  . Medical history non-contributory    Past Surgical History  Procedure Laterality Date  . No past surgeries    . Perineal laceration repair N/A 04/28/2013    Procedure: SUTURE REPAIR PERINEAL LACERATION;  Surgeon: Thurnell Lose, MD;  Location: [facility];  Service: Gynecology;  Laterality: N/A;   Family History: family history is not on file. Social History:  reports that she has never smoked. She does not have any smokeless tobacco history on file. She reports that she does not drink alcohol or use illicit drugs.   Prenatal Transfer Tool  Maternal Diabetes: No Genetic Screening: Normal Maternal Ultrasounds/Referrals: Normal Fetal Ultrasounds or other Referrals:  None Maternal Substance Abuse:  No Significant Maternal Medications:  None Significant Maternal Lab Results:  Lab values include: Group B Strep positive Other Comments:  None  ROS  Dilation: 10 Effacement (%): 90, 100 Station: 0 Exam by:: Apolo Cutshaw md Blood pressure 140/65, pulse 87, temperature 97.9 F (36.6 C), temperature source Oral, resp. rate 20, height 5\' 8"  (1.727 m), weight 102.785 kg (226 lb 9.6 oz), last menstrual period 04/29/2014, SpO2 100 %. Maternal Exam:  Uterine Assessment: Contraction strength is firm.  Contraction frequency is regular.   Abdomen: Fetal presentation:  vertex  Introitus: Normal vulva. Normal vagina.    Physical Exam  Constitutional: She is oriented to person, place, and time. She appears well-developed and well-nourished.  Cardiovascular: Normal rate and regular rhythm.   Respiratory: Effort normal.  GI: Soft.  Genitourinary: Vagina normal and uterus normal.  Neurological: She is alert and oriented to person, place, and time.  Psychiatric: She has a normal mood and affect.    Prenatal labs: ABO, Rh: --/--/O POS (09/24 0430) Antibody: NEG (09/24 0430) Rubella: Immune (05/04 0000) RPR: Nonreactive (05/04 0000)  HBsAg: Negative (05/04 0000)  HIV: Non-reactive (05/04 0000)  GBS: Positive (08/30 0000)  One hour GTT 118 Quad screen negative  Hgb AA   Assessment/Plan: Pt admitted and received epidural then progressed rapidly to complete dilation.  PCN was received for +GBS.  When I arrived the pt was 10cm and membranes intact, AROM at delivery produced clear fluid.   Logan Bores 01/26/2015, 7:09 AM

## 2015-01-27 LAB — CBC
HCT: 36.9 % (ref 36.0–46.0)
Hemoglobin: 12.4 g/dL (ref 12.0–15.0)
MCH: 29.1 pg (ref 26.0–34.0)
MCHC: 33.6 g/dL (ref 30.0–36.0)
MCV: 86.6 fL (ref 78.0–100.0)
PLATELETS: 174 10*3/uL (ref 150–400)
RBC: 4.26 MIL/uL (ref 3.87–5.11)
RDW: 14.1 % (ref 11.5–15.5)
WBC: 11.5 10*3/uL — ABNORMAL HIGH (ref 4.0–10.5)

## 2015-01-27 NOTE — Discharge Summary (Signed)
Obstetric Discharge Summary Reason for Admission: onset of labor Prenatal Procedures: none Intrapartum Procedures: spontaneous vaginal delivery and GBS prophylaxis Postpartum Procedures: none Complications-Operative and Postpartum: first degree perineal laceration HEMOGLOBIN  Date Value Ref Range Status  01/27/2015 12.4 12.0 - 15.0 g/dL Final  07/21/2012 12.6 12.2 - 16.2 g/dL Final   HCT  Date Value Ref Range Status  01/27/2015 36.9 36.0 - 46.0 % Final   HCT, POC  Date Value Ref Range Status  07/21/2012 39.4 37.7 - 47.9 % Final    Physical Exam:  General: alert and cooperative Lochia: appropriate Uterine Fundus: firm  Discharge Diagnoses: Term Pregnancy-delivered  Discharge Information: Date: 01/28/2015 Activity: pelvic rest Diet: routine Medications: Ibuprofen Condition: improved Instructions: refer to practice specific booklet Discharge to: home Follow-up Information    Follow up with Logan Bores, MD. Schedule an appointment as soon as possible for a visit in 6 weeks.   Specialty:  Obstetrics and Gynecology   Why:  postpartum   Contact information:   [address] [address] [phone]       Newborn Data: Live born female  Birth Weight: 7 lb 13.2 oz (3549 g) APGAR: 9, 9  Home with mother.  Logan Bores 01/28/2015, 8:52 AM

## 2015-01-27 NOTE — Progress Notes (Signed)
Post Partum Day 1  Subjective: no complaints and tolerating PO  Objective: Blood pressure 146/68, pulse 87, temperature 98.2 F (36.8 C), temperature source Oral, resp. rate 18, height 5\' 8"  (1.727 m), weight 102.785 kg (226 lb 9.6 oz), last menstrual period 04/29/2014, SpO2 100 %, unknown if currently breastfeeding.  Physical Exam:  General: alert and cooperative Lochia: appropriate Uterine Fundus: firm    Recent Labs  01/26/15 0430 01/27/15 0530  HGB 12.6 12.4  HCT 37.8 36.9    Assessment/Plan: Plan for discharge tomorrow   LOS: 1 day   RICHARDSON,KATHY W 01/27/2015, 8:23 AM

## 2015-01-28 MED ORDER — IBUPROFEN 600 MG PO TABS: 600.0000 mg | ORAL_TABLET | Freq: Four times a day (QID) | ORAL | Status: DC

## 2015-01-28 NOTE — Progress Notes (Signed)
Post Partum Day 2 Subjective: no complaints, up ad lib and tolerating PO  Objective: Blood pressure 121/84, pulse 82, temperature 98.3 F (36.8 C), temperature source Oral, resp. rate 18, height 5\' 8"  (1.727 m), weight 102.785 kg (226 lb 9.6 oz), last menstrual period 04/29/2014, SpO2 100 %, unknown if currently breastfeeding.  Physical Exam:  General: alert and cooperative Lochia: appropriate Uterine Fundus: firm    Recent Labs  01/26/15 0430 01/27/15 0530  HGB 12.6 12.4  HCT 37.8 36.9    Assessment/Plan: Discharge home   LOS: 2 days   RICHARDSON,KATHY W 01/28/2015, 8:51 AM

## 2019-04-26 ENCOUNTER — Other Ambulatory Visit: Payer: Self-pay

## 2019-04-26 ENCOUNTER — Ambulatory Visit: Payer: HRSA Program | Attending: Internal Medicine

## 2019-04-26 DIAGNOSIS — Z20828 Contact with and (suspected) exposure to other viral communicable diseases: Secondary | ICD-10-CM | POA: Insufficient documentation

## 2019-04-26 DIAGNOSIS — Z20822 Contact with and (suspected) exposure to covid-19: Secondary | ICD-10-CM

## 2019-04-27 ENCOUNTER — Ambulatory Visit: Payer: HRSA Program | Attending: Internal Medicine

## 2019-04-27 DIAGNOSIS — Z20828 Contact with and (suspected) exposure to other viral communicable diseases: Secondary | ICD-10-CM | POA: Diagnosis present

## 2019-04-27 DIAGNOSIS — Z20822 Contact with and (suspected) exposure to covid-19: Secondary | ICD-10-CM

## 2019-04-27 LAB — NOVEL CORONAVIRUS, NAA: SARS-CoV-2, NAA: NOT DETECTED

## 2019-04-29 LAB — NOVEL CORONAVIRUS, NAA: SARS-CoV-2, NAA: NOT DETECTED

## 2021-01-06 ENCOUNTER — Emergency Department (HOSPITAL_COMMUNITY)
Admission: EM | Admit: 2021-01-06 | Discharge: 2021-01-06 | Disposition: A | Discharge status: Home / Self Care | Payer: Self-pay | Attending: Emergency Medicine | Admitting: Emergency Medicine

## 2021-01-06 ENCOUNTER — Other Ambulatory Visit: Payer: Self-pay

## 2021-01-06 ENCOUNTER — Encounter (HOSPITAL_COMMUNITY): Payer: Self-pay | Admitting: Emergency Medicine

## 2021-01-06 DIAGNOSIS — R000 Tachycardia, unspecified: Secondary | ICD-10-CM | POA: Insufficient documentation

## 2021-01-06 DIAGNOSIS — R1110 Vomiting, unspecified: Secondary | ICD-10-CM | POA: Insufficient documentation

## 2021-01-06 DIAGNOSIS — J3489 Other specified disorders of nose and nasal sinuses: Secondary | ICD-10-CM | POA: Insufficient documentation

## 2021-01-06 DIAGNOSIS — Z20822 Contact with and (suspected) exposure to covid-19: Secondary | ICD-10-CM | POA: Insufficient documentation

## 2021-01-06 DIAGNOSIS — J069 Acute upper respiratory infection, unspecified: Secondary | ICD-10-CM | POA: Insufficient documentation

## 2021-01-06 LAB — I-STAT BETA HCG BLOOD, ED (MC, WL, AP ONLY): I-stat hCG, quantitative: <5 m[IU]/mL (ref <5)

## 2021-01-06 LAB — POC SARS CORONAVIRUS 2 AG -  ED: SARSCOV2ONAVIRUS 2 AG: NEGATIVE

## 2021-01-06 MED ORDER — ONDANSETRON 4 MG PO TBDP: 4.0000 mg | ORAL_TABLET | Freq: Three times a day (TID) | ORAL | 0 refills | Status: DC | PRN

## 2021-01-06 MED ORDER — ACETAMINOPHEN 500 MG PO TABS
1000.0000 mg | ORAL_TABLET | Freq: Once | ORAL | Status: AC
  Administered 2021-01-06: 1000 mg via ORAL
  Filled 2021-01-06: qty 2

## 2021-01-06 MED ORDER — ONDANSETRON 4 MG PO TBDP
4.0000 mg | ORAL_TABLET | Freq: Once | ORAL | Status: AC
  Administered 2021-01-06: 4 mg via ORAL
  Filled 2021-01-06: qty 1

## 2021-01-06 NOTE — ED Provider Notes (Signed)
[facility] EMERGENCY DEPARTMENT Provider Note   CSN: [id number] Arrival date & time: 01/06/21  0353     History Chief Complaint  Patient presents with   Generalized Body Aches   Emesis    Tami Crawford is a 35 y.o. female with no chronic medical conditions who presents the emergency department with a chief complaint of a 72 history of generalized body aches, headache, cough.  She reports that over the last few days that she developed mild shortness of breath, nausea, vomiting.  No known aggravating or alleviating factors.  Reports that she has only had 3 episodes of vomiting since onset.  She denies chest pain, rash, abdominal pain, diarrhea, constipation, dysuria, hematuria, rash, neck pain or stiffness.  She thinks that she may have had a fever several days ago.  She reports that her last menstrual cycle was last month, but she is unsure about pregnancy.  No known sick contacts.  She took Tylenol yesterday afternoon with some improvement in her symptoms.   The history is provided by the patient and medical records. No language interpreter was used.      Past Medical History:  Diagnosis Date   Medical history non-contributory     Patient Active Problem List   Diagnosis Date Noted   Normal labor 01/26/2015   NSVD (normal spontaneous vaginal delivery) 01/26/2015   Gestational hypertension 03/15/2013   GBS (group B Streptococcus carrier), +RV culture, currently pregnant 03/15/2013    Past Surgical History:  Procedure Laterality Date   NO PAST SURGERIES     PERINEAL LACERATION REPAIR N/A 04/28/2013   Procedure: SUTURE REPAIR PERINEAL LACERATION;  Surgeon: Thurnell Lose, MD;  Location: [facility];  Service: Gynecology;  Laterality: N/A;     OB History     Gravida  2   Para  2   Term  2   Preterm      AB      Living  2      SAB      IAB      Ectopic      Multiple  0   Live Births  2           No family history on file.  Social  History   Tobacco Use   Smoking status: Never  Substance Use Topics   Alcohol use: No   Drug use: No    Home Medications Prior to Admission medications   Medication Sig Start Date End Date Taking? Authorizing Provider  ondansetron (ZOFRAN ODT) 4 MG disintegrating tablet Take 1 tablet (4 mg total) by mouth every 8 (eight) hours as needed. 01/06/21  Yes Amoni Scallan A, PA-C  acetaminophen (TYLENOL) 325 MG tablet Take 650 mg by mouth every 6 (six) hours as needed.    [provider]  calcium carbonate (TUMS - DOSED IN MG ELEMENTAL CALCIUM) 500 MG chewable tablet Chew 1 tablet by mouth daily.    [provider]  ibuprofen (ADVIL,MOTRIN) 600 MG tablet Take 1 tablet (600 mg total) by mouth every 6 (six) hours. 01/28/15   Paula Compton, MD  Prenatal Vit-Fe Fumarate-FA (PRENATAL MULTIVITAMIN) TABS tablet Take 1 tablet by mouth daily at 12 noon.    [provider]    Allergies    Patient has no known allergies.  Review of Systems   Review of Systems  Constitutional:  Positive for fever. Negative for activity change and chills.  HENT:  Positive for congestion. Negative for ear discharge, ear pain,  hearing loss, nosebleeds, postnasal drip, sinus pain and sore throat.   Respiratory:  Positive for cough and shortness of breath. Negative for wheezing.   Cardiovascular:  Negative for chest pain and palpitations.  Gastrointestinal:  Positive for nausea and vomiting. Negative for abdominal pain, constipation and diarrhea.  Genitourinary:  Negative for dysuria and urgency.  Musculoskeletal:  Negative for arthralgias, back pain, myalgias, neck pain and neck stiffness.  Skin:  Negative for rash and wound.  Allergic/Immunologic: Negative for immunocompromised state.  Neurological:  Positive for headaches. Negative for dizziness, seizures, syncope, speech difficulty, weakness, light-headedness and numbness.  Psychiatric/Behavioral:  Negative for confusion.    Physical  Exam Updated Vital Signs BP 115/67 (BP Location: Right Arm)   Pulse 90   Temp 98.5 F (36.9 C) (Oral)   Resp 16   SpO2 100%   Physical Exam Vitals and nursing note reviewed.  Constitutional:      General: She is not in acute distress.    Appearance: She is not ill-appearing, toxic-appearing or diaphoretic.  HENT:     Head: Normocephalic.     Right Ear: Tympanic membrane, ear canal and external ear normal.     Left Ear: Tympanic membrane, ear canal and external ear normal.     Nose: Congestion and rhinorrhea present.  Eyes:     General: No scleral icterus.    Extraocular Movements: Extraocular movements intact.     Conjunctiva/sclera: Conjunctivae normal.     Pupils: Pupils are equal, round, and reactive to light.  Cardiovascular:     Rate and Rhythm: Normal rate and regular rhythm.     Pulses: Normal pulses.     Heart sounds: Normal heart sounds. No murmur heard.   No friction rub. No gallop.  Pulmonary:     Effort: Pulmonary effort is normal. No respiratory distress.     Breath sounds: No stridor. No wheezing, rhonchi or rales.     Comments: Lungs are clear to auscultation bilaterally. Chest:     Chest wall: No tenderness.  Abdominal:     General: There is no distension.     Palpations: Abdomen is soft. There is no mass.     Tenderness: There is no abdominal tenderness. There is no right CVA tenderness, left CVA tenderness, guarding or rebound.     Hernia: No hernia is present.     Comments: Abdomen is soft and nontender.  Musculoskeletal:        General: No tenderness.     Cervical back: Normal range of motion and neck supple.     Right lower leg: No edema.     Left lower leg: No edema.  Skin:    General: Skin is warm.     Capillary Refill: Capillary refill takes less than 2 seconds.     Coloration: Skin is not pale.     Findings: No lesion or rash.  Neurological:     General: No focal deficit present.     Mental Status: She is alert.  Psychiatric:         Behavior: Behavior normal.    ED Results / Procedures / Treatments   Labs (all labs ordered are listed, but only abnormal results are displayed) Labs Reviewed  RESP PANEL BY RT-PCR (FLU A&B, COVID) ARPGX2  I-STAT BETA HCG BLOOD, ED ([facility], [facility], [facility] ONLY)  POC SARS CORONAVIRUS 2 AG -  ED    EKG None  Radiology No results found.  Procedures Procedures   Medications Ordered in ED Medications  acetaminophen (TYLENOL) tablet 1,000 mg (1,000 mg Oral Given 01/06/21 0444)  ondansetron (ZOFRAN-ODT) disintegrating tablet 4 mg (4 mg Oral Given 01/06/21 0445)    ED Course  I have reviewed the triage vital signs and the nursing notes.  Pertinent labs & imaging results that were available during my care of the patient were reviewed by me and considered in my medical decision making (see chart for details).    MDM Rules/Calculators/A&P                           35 year old female with no chronic medical conditions presenting to the emergency department with a 7-day history of URI symptoms.  She has developed mild shortness of breath and nausea and vomiting over the last few days.  She is only had 3 episodes of vomiting since onset.  Minimal tachycardia on arrival.  Vital signs are otherwise unremarkable.  Physical exam is overall reassuring.  She is nontoxic.  She has no increased work of breathing.  Lungs are clear to auscultation bilaterally.  Suspect viral URI, specifically COVID-19.  Patient is also requesting a pregnancy test.  Labs have been ordered and reviewed by me.  Antigen COVID-19 test is negative.  Discussed with patient will order PCR test.  Pregnancy test is negative.  If COVID test is negative, still suspect viral URI.  Doubt bacteremia, kidney acquired pneumonia, meningitis, intra-abdominal infection.  Patient was successfully fluid challenge.  She was given Tylenol for headache and on reevaluation, reports that her headache had resolved as well as her muscle aches.  Has had no  episodes of vomiting.  She is feeling markedly improved.  Home supportive care discussed.  She is hemodynamically stable in no acute distress.  Safer discharge home with outpatient follow-up as needed.  Final Clinical Impression(s) / ED Diagnoses Final diagnoses:  Viral URI    Rx / DC Orders ED Discharge Orders          Ordered    ondansetron (ZOFRAN ODT) 4 MG disintegrating tablet  Every 8 hours PRN        01/06/21 0548             Semir Brill A, PA-C 01/06/21 0710    Blanchie Dessert, MD 01/09/21 1154

## 2021-01-06 NOTE — Discharge Instructions (Addendum)
Merci de m'avoir permis de prendre soin de vous aujourd'hui au service des [employer].  Votre test COVID-19 est en attente. Vous devriez recevoir un appel de l'hpital s'il est positif.  Prenez 650 mg de Tylenol ou 600 mg d'ibuprofne avec de la nourriture toutes les 6 heures pour [employer] ou la fivre. Vous pouvez alterner entre ces 2 mdicaments toutes les 3 heures si vos douleurs reviennent. Par exemple, vous pouvez prendre Tylenol  midi, suivi d'une dose d'ibuprofne  3, suivi d'une deuxime dose de Tylenol et 6.  Laissez 1 comprim de Zofran se dissoudre dans votre langue toutes les 8 heures au besoin pour les nauses ou les vomissements.  Retournez au service des urgences si vous dveloppez une dtresse Cytogeneticist, des vomissements incontrlables malgr la prise de Zofran, si vous arrtez de produire de l'urine, si vous dveloppez une douleur incontrlable, un nouvel engourdissement ou [place], ou d'autres nouveaux symptmes proccupants.  Thank you for allowing me to care for you today in the Emergency Department.   Your COVID-19 test is pending.  You should receive a call from the hospital if it is positive.  Take 650 mg of Tylenol or 600 mg of ibuprofen with food every 6 hours for pain or fever.  You can alternate between these 2 medications every 3 hours if your pain returns.  For instance, you can take Tylenol at noon, followed by a dose of ibuprofen at 3, followed by second dose of Tylenol and 6.  Let 1 tablet of Zofran dissolve in your tongue every 8 hours as needed for nausea or vomiting.  Return to the emergency department if you develop respiratory distress, uncontrollable vomiting despite taking Zofran, if you stop making urine, if you develop uncontrollable pain, new numbness or weakness, or other new, concerning symptoms.

## 2021-01-06 NOTE — ED Notes (Signed)
Patient was given fluids and was able to toleraate

## 2021-01-06 NOTE — ED Triage Notes (Signed)
Pt reports n/v and generalized body aches.  Pt reports some SOB.

## 2021-03-11 ENCOUNTER — Other Ambulatory Visit: Payer: Self-pay

## 2021-03-11 ENCOUNTER — Inpatient Hospital Stay (HOSPITAL_COMMUNITY)
Admission: AD | Admit: 2021-03-11 | Discharge: 2021-03-11 | Disposition: A | Discharge status: Home / Self Care | Payer: Medicaid Other | Attending: Obstetrics and Gynecology | Admitting: Obstetrics and Gynecology

## 2021-03-11 ENCOUNTER — Inpatient Hospital Stay (HOSPITAL_COMMUNITY): Payer: Medicaid Other

## 2021-03-11 ENCOUNTER — Encounter (HOSPITAL_COMMUNITY): Payer: Self-pay | Admitting: Obstetrics and Gynecology

## 2021-03-11 DIAGNOSIS — Z3A09 9 weeks gestation of pregnancy: Secondary | ICD-10-CM | POA: Insufficient documentation

## 2021-03-11 DIAGNOSIS — O26899 Other specified pregnancy related conditions, unspecified trimester: Secondary | ICD-10-CM

## 2021-03-11 DIAGNOSIS — O208 Other hemorrhage in early pregnancy: Secondary | ICD-10-CM | POA: Diagnosis not present

## 2021-03-11 DIAGNOSIS — O26891 Other specified pregnancy related conditions, first trimester: Secondary | ICD-10-CM | POA: Diagnosis not present

## 2021-03-11 DIAGNOSIS — R109 Unspecified abdominal pain: Secondary | ICD-10-CM | POA: Diagnosis present

## 2021-03-11 DIAGNOSIS — Z3491 Encounter for supervision of normal pregnancy, unspecified, first trimester: Secondary | ICD-10-CM

## 2021-03-11 DIAGNOSIS — O99891 Other specified diseases and conditions complicating pregnancy: Secondary | ICD-10-CM

## 2021-03-11 LAB — CBC
HCT: 38.2 % (ref 36.0–46.0)
Hemoglobin: 12.9 g/dL (ref 12.0–15.0)
MCH: 28.2 pg (ref 26.0–34.0)
MCHC: 33.8 g/dL (ref 30.0–36.0)
MCV: 83.6 fL (ref 80.0–100.0)
Platelets: 252 10*3/uL (ref 150–400)
RBC: 4.57 MIL/uL (ref 3.87–5.11)
RDW: 12.8 % (ref 11.5–15.5)
WBC: 7.8 10*3/uL (ref 4.0–10.5)
nRBC: 0 % (ref 0.0–0.2)

## 2021-03-11 LAB — URINALYSIS, ROUTINE W REFLEX MICROSCOPIC
Bilirubin Urine: NEGATIVE
Glucose, UA: 50 mg/dL — AB
Ketones, ur: NEGATIVE mg/dL
Leukocytes,Ua: NEGATIVE
Nitrite: NEGATIVE
Protein, ur: NEGATIVE mg/dL
Specific Gravity, Urine: 1.025 (ref 1.005–1.030)
pH: 5 (ref 5.0–8.0)

## 2021-03-11 LAB — WET PREP, GENITAL
Clue Cells Wet Prep HPF POC: NONE SEEN
Sperm: NONE SEEN
Trich, Wet Prep: NONE SEEN
WBC, Wet Prep HPF POC: NONE SEEN
Yeast Wet Prep HPF POC: NONE SEEN

## 2021-03-11 LAB — HCG, QUANTITATIVE, PREGNANCY: hCG, Beta Chain, Quant, S: 76912 m[IU]/mL — ABNORMAL HIGH (ref <5)

## 2021-03-11 NOTE — MAU Provider Note (Signed)
History     CSN: [id number]  Arrival date and time: 03/11/21 0242   Event Date/Time   First Provider Initiated Contact with Patient 03/11/21 [phone]      Chief Complaint  Patient presents with   Abdominal Pain   HPI Tami Crawford is a 35 y.o. G3P2002 at [redacted]w[redacted]d who presents with abdominal pain.  Reports intermittent lower abdominal cramping for the last week.  Rates pain 4/10.  Has not treated symptoms.  Denies alleviating or aggravating factors.  Reports daily vomiting, vomits about 1-2 times per day.  Currently not nauseated.  Denies fever, dysuria, diarrhea, constipation, vaginal bleeding, or vaginal discharge.  OB History     Gravida  3   Para  2   Term  2   Preterm      AB      Living  2      SAB      IAB      Ectopic      Multiple  0   Live Births  2           Past Medical History:  Diagnosis Date   Medical history non-contributory     Past Surgical History:  Procedure Laterality Date   PERINEAL LACERATION REPAIR N/A 04/28/2013   Procedure: SUTURE REPAIR PERINEAL LACERATION;  Surgeon: Thurnell Lose, MD;  Location: [facility];  Service: Gynecology;  Laterality: N/A;    History reviewed. No pertinent family history.  Social History   Tobacco Use   Smoking status: Never   Smokeless tobacco: Never  Vaping Use   Vaping Use: Never used  Substance Use Topics   Alcohol use: No   Drug use: No    Allergies: No Known Allergies  Medications Prior to Admission  Medication Sig Dispense Refill Last Dose   acetaminophen (TYLENOL) 325 MG tablet Take 650 mg by mouth every 6 (six) hours as needed.      calcium carbonate (TUMS - DOSED IN MG ELEMENTAL CALCIUM) 500 MG chewable tablet Chew 1 tablet by mouth daily.      ondansetron (ZOFRAN ODT) 4 MG disintegrating tablet Take 1 tablet (4 mg total) by mouth every 8 (eight) hours as needed. 20 tablet 0    Prenatal Vit-Fe Fumarate-FA (PRENATAL MULTIVITAMIN) TABS tablet Take 1 tablet by mouth daily at 12 noon.        Review of Systems  Constitutional: Negative.   Gastrointestinal:  Positive for abdominal pain, nausea and vomiting. Negative for diarrhea.  Genitourinary: Negative.   Physical Exam   Blood pressure 112/67, pulse 78, temperature 98.1 F (36.7 C), temperature source Oral, resp. rate 17, height 5\' 7"  (1.702 m), last menstrual period 01/13/2021, SpO2 100 %, unknown if currently breastfeeding.  Physical Exam Vitals and nursing note reviewed.  Constitutional:      Appearance: She is well-developed. She is not ill-appearing.  HENT:     Head: Normocephalic and atraumatic.  Eyes:     General: No scleral icterus. Pulmonary:     Effort: Pulmonary effort is normal. No respiratory distress.  Abdominal:     General: Abdomen is flat.     Palpations: Abdomen is soft.     Tenderness: There is no abdominal tenderness.  Skin:    General: Skin is warm and dry.  Neurological:     General: No focal deficit present.     Mental Status: She is alert.  Psychiatric:        Mood and Affect: Mood normal.  Behavior: Behavior normal.    MAU Course  Procedures Results for orders placed or performed during the hospital encounter of 03/11/21 (from the past 24 hour(s))  Urinalysis, Routine w reflex microscopic Urine, Clean Catch     Status: Abnormal   Collection Time: 03/11/21  3:33 AM  Result Value Ref Range   Color, Urine YELLOW YELLOW   APPearance HAZY (A) CLEAR   Specific Gravity, Urine 1.025 1.005 - 1.030   pH 5.0 5.0 - 8.0   Glucose, UA 50 (A) NEGATIVE mg/dL   Hgb urine dipstick SMALL (A) NEGATIVE   Bilirubin Urine NEGATIVE NEGATIVE   Ketones, ur NEGATIVE NEGATIVE mg/dL   Protein, ur NEGATIVE NEGATIVE mg/dL   Nitrite NEGATIVE NEGATIVE   Leukocytes,Ua NEGATIVE NEGATIVE   RBC / HPF 0-5 0 - 5 RBC/hpf   WBC, UA 0-5 0 - 5 WBC/hpf   Bacteria, UA RARE (A) NONE SEEN   Squamous Epithelial / LPF 6-10 0 - 5   Mucus PRESENT   Wet prep, genital     Status: None   Collection Time: 03/11/21   3:41 AM  Result Value Ref Range   Yeast Wet Prep HPF POC NONE SEEN NONE SEEN   Trich, Wet Prep NONE SEEN NONE SEEN   Clue Cells Wet Prep HPF POC NONE SEEN NONE SEEN   WBC, Wet Prep HPF POC NONE SEEN NONE SEEN   Sperm NONE SEEN   CBC     Status: None   Collection Time: 03/11/21  3:54 AM  Result Value Ref Range   WBC 7.8 4.0 - 10.5 K/uL   RBC 4.57 3.87 - 5.11 MIL/uL   Hemoglobin 12.9 12.0 - 15.0 g/dL   HCT 38.2 36.0 - 46.0 %   MCV 83.6 80.0 - 100.0 fL   MCH 28.2 26.0 - 34.0 pg   MCHC 33.8 30.0 - 36.0 g/dL   RDW 12.8 11.5 - 15.5 %   Platelets 252 150 - 400 K/uL   nRBC 0.0 0.0 - 0.2 %  hCG, quantitative, pregnancy     Status: Abnormal   Collection Time: 03/11/21  3:54 AM  Result Value Ref Range   hCG, Beta Chain, Quant, S 76,912 (H) <5 mIU/mL   US OB Comp Less 14 Wks  Result Date: 03/11/2021 CLINICAL DATA:  Cramping/spotting. EXAM: OBSTETRIC <14 WK ULTRASOUND TECHNIQUE: Transabdominal ultrasound was performed for evaluation of the gestation as well as the maternal uterus and adnexal regions. COMPARISON:  No recent study is available for review. FINDINGS: There is a single intrauterine gestational sac. A fetal pole and small yolk sac are identified. Heart Rate: 173 bpm CRL:   24.6 mm   9 w 1 d                  [place] EDC: 10/13/2021 Subchorionic hemorrhage: Small subchorionic hemorrhage measuring 1.2 x 0.7 x 0.4 cm. Maternal uterus/adnexae: The left ovary measures 3.3 x 3.5 x 1.9 cm. The right ovary measures 3.3 by 3.4 x 2.2 cm. There is a 1.5 x 1.2 x 1.7 cm hyperechoic lesion in the right ovary, possible dermoid. IMPRESSION: 1. Single live intrauterine pregnancy with estimated gestational age of [redacted] weeks 1 day. EDC 10/13/2021. 2. Small subchorionic hemorrhage. 3. Hyperechoic lesion in the right ovary, possible dermoid cyst. Electronically Signed   By: Brett Fairy M.D.   On: 03/11/2021 04:36    MDM +UPT UA, wet prep, GC/chlamydia, CBC, ABO/Rh, quant hCG, and [place] today to rule out ectopic  pregnancy which can  be life threatening.   Ultrasound shows live IUP measuring 9 weeks 1 day, consistent with dating by LMP.  Also shows small subchorionic hemorrhage.  Patient denies vaginal bleeding and is Rh+.  Assessment and Plan   1. Normal IUP (intrauterine pregnancy) on prenatal ultrasound, first trimester   2. Abdominal cramping affecting pregnancy   3. [redacted] weeks gestation of pregnancy    -start prenatal care, given list of providers -GC/CT pending -reviewed reasons to return to [place] 03/11/2021, 3:42 AM

## 2021-03-11 NOTE — MAU Note (Signed)
..  Tami Crawford is a 35 y.o. at Unknown here in MAU reporting: Has not taken a home pregnancy test. Reports abdominal pain that has been going on for a week, took tylenol this morning and it helped but began again. Has not taken tylenol since this morning. Patient is not able to describe the pain.   Pain score: 7/10 Vitals:   03/11/21 0302  BP: 115/79  Pulse: (!) 107  Resp: 17  Temp: 98.1 F (36.7 C)  SpO2: 100%      Lab orders placed from triage: POCT pregnancy

## 2021-03-11 NOTE — Discharge Instructions (Signed)
Return to care  If you have heavier bleeding that soaks through more than 2 pads per hour for an hour or more If you bleed so much that you feel like you might pass out or you do pass out If you have significant abdominal pain that is not improved with Tylenol     [facility] for [facility] at [facility]  [address], [place], [place] [address]  [place] for [place] at [address], [place], [place] [address]  [place] for [place] at [facility] [address] [place], [place] [address]  [place] for [place] at [facility]  [address] [place], [place] [address]  [phone]  [place] for [place] at [facility] for [place] (First floor), [place], [place] [address]  [place] for [facility] at [place], [place], [place] [address] [place] for [place] at [facility]  [place], [place], [place] [address]  [phone]  [facility]  [address], [place], [place] [address]  [place]  [place], [place], [place] [address]  [phone]  [facility]  [address] Jaclynn Guarneri [place], [place] [address]  [place] [address], [place], [place] [address]  [phone]  [facility]  [address], [place], [place] [address]  [phone]  [place]   [address] Johnette Abraham [place], [place] [address]  [phone]  [facility] [address], [place], [place] [address]   [phone]  [facility]  [address] [place], [place] [address]  [phone]

## 2021-03-11 NOTE — MAU Note (Signed)
Pt reports occasional bleeding when she wipes after using the bathroom.

## 2021-03-12 LAB — GC/CHLAMYDIA PROBE AMP (~~LOC~~) NOT AT ARMC
Chlamydia: NEGATIVE
Comment: NEGATIVE
Comment: NORMAL
Neisseria Gonorrhea: NEGATIVE

## 2021-03-26 ENCOUNTER — Telehealth (INDEPENDENT_AMBULATORY_CARE_PROVIDER_SITE_OTHER): Payer: Self-pay

## 2021-03-26 DIAGNOSIS — Z3A00 Weeks of gestation of pregnancy not specified: Secondary | ICD-10-CM

## 2021-03-26 DIAGNOSIS — O0990 Supervision of high risk pregnancy, unspecified, unspecified trimester: Secondary | ICD-10-CM | POA: Insufficient documentation

## 2021-03-26 DIAGNOSIS — O26899 Other specified pregnancy related conditions, unspecified trimester: Secondary | ICD-10-CM

## 2021-03-26 DIAGNOSIS — Z3491 Encounter for supervision of normal pregnancy, unspecified, first trimester: Secondary | ICD-10-CM | POA: Insufficient documentation

## 2021-03-26 DIAGNOSIS — O26819 Pregnancy related exhaustion and fatigue, unspecified trimester: Secondary | ICD-10-CM

## 2021-03-26 DIAGNOSIS — O219 Vomiting of pregnancy, unspecified: Secondary | ICD-10-CM

## 2021-03-26 DIAGNOSIS — R519 Headache, unspecified: Secondary | ICD-10-CM

## 2021-03-26 MED ORDER — PROMETHAZINE HCL 25 MG PO TABS
25.0000 mg | ORAL_TABLET | Freq: Four times a day (QID) | ORAL | 0 refills | Status: DC | PRN
Start: 2021-03-26 — End: 2021-05-19

## 2021-03-26 NOTE — Progress Notes (Signed)
Patient was evaluated by nursing staff. Agree with assessment and plan.

## 2021-03-26 NOTE — Progress Notes (Signed)
New OB Intake  I connected with  Tami Crawford on 03/26/21 at 1124 by telephone with [facility] interpreter [place] Mor ID [id number] and verified that I am speaking with the correct person using two identifiers. Nurse is located at [facility] and pt is located at home. Approx. 50 minutes spent on telephone encounter.   I discussed the limitations, risks, security and privacy concerns of performing an evaluation and management service by telephone and the availability of in person appointments. I also discussed with the patient that there may be a patient responsible charge related to this service. The patient expressed understanding and agreed to proceed.  I explained I am completing New OB Intake today. We discussed her EDD of 10/13/21 that is based on early [place]. Pt is G3/P2002. I reviewed her allergies, medications, Medical/Surgical/OB history, and appropriate screenings. Did not complete PHQ-9 or GAD-7 or inform pt of [facility] services due to time constraints. Based on history, this is a low risk pregnancy.   Patient Active Problem List   Diagnosis Date Noted   Supervision of low-risk pregnancy, first trimester 03/26/2021   History of gestational hypertension 03/15/2013   Concerns addressed today N/V: Did not fill zofran rx, patient was given printed copy. Encouraged pt to fill. Also explained I will send phenergan prn. Headache: Patient taking Tylenol with improvement.  Fatigue: Encouraged pt that this is likely due to inability to eat and drink normally due to N/V. Pt to follow up with office if this does not resolve with N/V improvement.  Delivery Plans:  Plans to deliver at [facility].   MyChart/Babyscripts Patient does not want to use MyChart. Babyscripts not covered with patient due to language barrier.  Blood Pressure Cuff  Patient has applied for Medicaid, currently pending. Will follow up at new OB if patient needs cuff given or ordered.  Weight scale: Patient has applied for Medicaid, currently  pending. Weight scale ordered for patient to pick up form [facility].   Anatomy US Explained first scheduled [place] will be around 19 weeks. Anatomy US scheduled for 05/19/20 at 1 PM. Patient will need to be notified of appt.  Labs Discussed Johnsie Cancel genetic screening with patient. Would like both Panorama and Horizon drawn at new OB visit. Routine prenatal labs needed.  COVID Vaccine Patient has had COVID vaccine. Received Pfizer 05/22/20, 06/12/20.  Centering in Pregnancy Candidate?  No-- [place] speaking.  Mother/ Baby Dyad Candidate?    No-- 2 prior living children.  Social Determinants of Health Food Insecurity: Patient denies food insecurity. WIC Referral: patient desires referral.  Transportation: Patient denies transportation needs. Childcare: Discussed no children allowed at ultrasound appointments. Offered childcare services; patient declines childcare services at this time.  First visit review I reviewed new OB appt with pt. I explained she will have a provider visit with physical exam and OB labs, including PAP smear. Explained pt will be seen by Darrelyn Hillock, DO at first visit; encounter routed to appropriate provider. Explained that patient will be seen by pregnancy navigator following visit with provider.  Tami Howells, RN 03/26/2021  12:19 PM

## 2021-04-02 ENCOUNTER — Telehealth: Payer: Self-pay

## 2021-04-11 ENCOUNTER — Other Ambulatory Visit: Payer: Self-pay

## 2021-04-11 ENCOUNTER — Encounter (HOSPITAL_COMMUNITY): Payer: Self-pay | Admitting: Family Medicine

## 2021-04-11 ENCOUNTER — Inpatient Hospital Stay (HOSPITAL_COMMUNITY)
Admission: AD | Admit: 2021-04-11 | Discharge: 2021-04-11 | Disposition: A | Discharge status: Home / Self Care | Payer: Medicaid Other | Attending: Family Medicine | Admitting: Family Medicine

## 2021-04-11 DIAGNOSIS — O209 Hemorrhage in early pregnancy, unspecified: Secondary | ICD-10-CM

## 2021-04-11 DIAGNOSIS — Z3A13 13 weeks gestation of pregnancy: Secondary | ICD-10-CM | POA: Diagnosis not present

## 2021-04-11 LAB — URINALYSIS, ROUTINE W REFLEX MICROSCOPIC
Bilirubin Urine: NEGATIVE
Bilirubin Urine: NEGATIVE
Glucose, UA: 50 mg/dL — AB
Glucose, UA: NEGATIVE mg/dL
Hgb urine dipstick: NEGATIVE
Hgb urine dipstick: NEGATIVE
Ketones, ur: NEGATIVE mg/dL
Ketones, ur: NEGATIVE mg/dL
Leukocytes,Ua: NEGATIVE
Leukocytes,Ua: NEGATIVE
Nitrite: NEGATIVE
Nitrite: NEGATIVE
Protein, ur: NEGATIVE mg/dL
Protein, ur: NEGATIVE mg/dL
Specific Gravity, Urine: 1.013 (ref 1.005–1.030)
Specific Gravity, Urine: 1.016 (ref 1.005–1.030)
pH: 6 (ref 5.0–8.0)
pH: 7 (ref 5.0–8.0)

## 2021-04-11 NOTE — MAU Provider Note (Signed)
History     CSN: [id number]  Arrival date and time: 04/11/21 2013   Event Date/Time   First Provider Initiated Contact with Patient 04/11/21 2141      Chief Complaint  Patient presents with   Vaginal Bleeding   Abdominal Pain   Tami Crawford is a 35 y.o. G3P2002 at [redacted]w[redacted]d who receives care at [facility].  She presents today for Vaginal Bleeding and Abdominal Pain. Patient states she started having bleeding today at [place], but it has since stopped.  However, she states when it occurred "it filled up a diaper."  She reports it was red in color and without clots. She reports sexual activity last night. She denies vaginal discharge.     OB History     Gravida  3   Para  2   Term  2   Preterm  0   AB  0   Living  2      SAB  0   IAB  0   Ectopic  0   Multiple  0   Live Births  2           Past Medical History:  Diagnosis Date   Medical history non-contributory     Past Surgical History:  Procedure Laterality Date   PERINEAL LACERATION REPAIR N/A 04/28/2013   Procedure: SUTURE REPAIR PERINEAL LACERATION;  Surgeon: Thurnell Lose, MD;  Location: [facility];  Service: Gynecology;  Laterality: N/A;    History reviewed. No pertinent family history.  Social History   Tobacco Use   Smoking status: Never   Smokeless tobacco: Never  Vaping Use   Vaping Use: Never used  Substance Use Topics   Alcohol use: No   Drug use: No    Allergies: No Known Allergies  Medications Prior to Admission  Medication Sig Dispense Refill Last Dose   acetaminophen (TYLENOL) 325 MG tablet Take 650 mg by mouth every 6 (six) hours as needed.   04/11/2021   ondansetron (ZOFRAN ODT) 4 MG disintegrating tablet Take 1 tablet (4 mg total) by mouth every 8 (eight) hours as needed. 20 tablet 0 04/11/2021   Prenatal Vit-Fe Fumarate-FA (PRENATAL MULTIVITAMIN) TABS tablet Take 1 tablet by mouth daily at 12 noon.   04/11/2021   calcium carbonate (TUMS - DOSED IN MG ELEMENTAL CALCIUM) 500 MG  chewable tablet Chew 1 tablet by mouth daily. (Patient not taking: Reported on 03/26/2021)      dimenhyDRINATE (DRAMAMINE PO) Take by mouth.      promethazine (PHENERGAN) 25 MG tablet Take 1 tablet (25 mg total) by mouth every 6 (six) hours as needed for nausea or vomiting. 30 tablet 0     Review of Systems  Gastrointestinal:  Negative for abdominal pain, constipation, diarrhea, nausea and vomiting.  Genitourinary:  Positive for pelvic pain (Intermittent; "like a period.") and vaginal bleeding. Negative for difficulty urinating, dysuria and vaginal discharge.  Physical Exam   Blood pressure 115/84, pulse 85, temperature 98.3 F (36.8 C), temperature source Oral, resp. rate 17, height 5\' 7"  (1.702 m), weight 105.5 kg, last menstrual period 01/13/2021, SpO2 100 %, unknown if currently breastfeeding.  Physical Exam Vitals reviewed. Exam conducted with a chaperone present.  Constitutional:      Appearance: She is well-developed. She is obese.  HENT:     Head: Normocephalic and atraumatic.  Cardiovascular:     Rate and Rhythm: Normal rate.  Pulmonary:     Effort: Pulmonary effort is normal. No respiratory distress.  Breath sounds: Normal breath sounds.  Abdominal:     Palpations: Abdomen is soft.     Tenderness: There is no abdominal tenderness.  Genitourinary:    Vagina: No vaginal discharge or bleeding.     Cervix: No cervical motion tenderness.     Uterus: Enlarged.      Comments: Speculum Exam Completed: NEFG No apparent discharge or blood at introitus or in vault. Cervix: pink, no lesions, polyps or cysts.  Scant amt dark brown blood noted at os. No active bleeding noted. Bimanual exam: Uterus nontender, enlarged. Closed. No blood on exam glove. Skin:    General: Skin is warm and dry.  Neurological:     Mental Status: She is alert.    MAU Course  Procedures Results for orders placed or performed during the hospital encounter of 04/11/21 (from the past 24 hour(s))   Urinalysis, Routine w reflex microscopic Urine, Clean Catch     Status: Abnormal   Collection Time: 04/11/21  9:03 PM  Result Value Ref Range   Color, Urine YELLOW YELLOW   APPearance HAZY (A) CLEAR   Specific Gravity, Urine 1.016 1.005 - 1.030   pH 6.0 5.0 - 8.0   Glucose, UA NEGATIVE NEGATIVE mg/dL   Hgb urine dipstick NEGATIVE NEGATIVE   Bilirubin Urine NEGATIVE NEGATIVE   Ketones, ur NEGATIVE NEGATIVE mg/dL   Protein, ur NEGATIVE NEGATIVE mg/dL   Nitrite NEGATIVE NEGATIVE   Leukocytes,Ua NEGATIVE NEGATIVE   Patient informed that the ultrasound is considered a limited OB ultrasound and is not intended to be a complete ultrasound exam.  Patient also informed that the ultrasound is not being completed with the intent of assessing for fetal or placental anomalies or any pelvic abnormalities.  Explained that the purpose of today's ultrasound is to assess for  viability.  Patient acknowledges the purpose of the exam and the limitations of the study.   MDM Pelvic Exam Labs; UA BSUS Assessment and Plan  35 year old G3P2002 at 13.4 weeks Vaginal Bleeding  -POC Reviewed -Exam performed and findings discussed. -Reassured that no active bleeding noted or blood in vault. -Instructed to maintain pelvic rest for at least 72 hours after bleeding subsides. -Further instructed to abstain from sexual activity if bleeding noted after every or several interactions.  -Discussed limited BSUS for reassurance and patient agreeable. -BSUS completed. Findings c/w dates.  Good fetal HR noted.  Fetal Movement visualized. -Bleeding precautions given. -Patient without questions or concerns. -Encouraged to call primary office or return to MAU if symptoms worsen or with the onset of new symptoms. -Interpreter present, but used minimally at patient understands and speaks adequate English: [place] [phone]. -Discharged to home in stable condition.   [name] 04/11/2021, 9:42 PM

## 2021-04-11 NOTE — MAU Note (Signed)
..  Tami Crawford is a 35 y.o. at [redacted]w[redacted]d here in MAU reporting: stomach ache that began this morning and began having a lot of vaginal bleeding around 1930. She is wearing a pad and it was saturated when she changed it.  Last intercourse: last night Pain score: 4/10 Vitals:   04/11/21 2058  BP: 115/84  Pulse: 85  Resp: 17  Temp: 98.3 F (36.8 C)  SpO2: 100%     FHT: 143 Lab orders placed from triage: UA

## 2021-04-11 NOTE — MAU Note (Signed)
Gavin Pound, CNM performed bedside ultrasound.

## 2021-04-23 ENCOUNTER — Other Ambulatory Visit (HOSPITAL_COMMUNITY)
Admission: RE | Admit: 2021-04-23 | Discharge: 2021-04-23 | Disposition: A | Discharge status: Home / Self Care | Payer: Medicaid Other | Source: Ambulatory Visit | Attending: Family Medicine | Admitting: Family Medicine

## 2021-04-23 ENCOUNTER — Other Ambulatory Visit: Payer: Self-pay

## 2021-04-23 ENCOUNTER — Ambulatory Visit (INDEPENDENT_AMBULATORY_CARE_PROVIDER_SITE_OTHER): Payer: Medicaid Other | Admitting: Family Medicine

## 2021-04-23 ENCOUNTER — Encounter: Payer: Self-pay | Admitting: Family Medicine

## 2021-04-23 VITALS — BP 119/84 | HR 94 | Wt 228.2 lb

## 2021-04-23 DIAGNOSIS — Z1151 Encounter for screening for human papillomavirus (HPV): Secondary | ICD-10-CM | POA: Insufficient documentation

## 2021-04-23 DIAGNOSIS — Z124 Encounter for screening for malignant neoplasm of cervix: Secondary | ICD-10-CM

## 2021-04-23 DIAGNOSIS — Z8759 Personal history of other complications of pregnancy, childbirth and the puerperium: Secondary | ICD-10-CM

## 2021-04-23 DIAGNOSIS — Z3A15 15 weeks gestation of pregnancy: Secondary | ICD-10-CM

## 2021-04-23 DIAGNOSIS — Z3491 Encounter for supervision of normal pregnancy, unspecified, first trimester: Secondary | ICD-10-CM

## 2021-04-23 DIAGNOSIS — O209 Hemorrhage in early pregnancy, unspecified: Secondary | ICD-10-CM

## 2021-04-23 LAB — POCT URINALYSIS DIP (DEVICE)
Bilirubin Urine: NEGATIVE
Glucose, UA: NEGATIVE mg/dL
Ketones, ur: NEGATIVE mg/dL
Leukocytes,Ua: NEGATIVE
Nitrite: NEGATIVE
Protein, ur: NEGATIVE mg/dL
Specific Gravity, Urine: 1.02 (ref 1.005–1.030)
Urobilinogen, UA: 0.2 mg/dL (ref 0.0–1.0)
pH: 7 (ref 5.0–8.0)

## 2021-04-23 MED ORDER — BLOOD PRESSURE KIT DEVI: 1.0000 | 0 refills | Status: DC

## 2021-04-23 NOTE — Patient Instructions (Addendum)
Anatomy ultrasound is scheduled for 05/19/21 at 1:00 PM.   Please go to the MAU if have more bleeding that requires a pad or severe abdomen cramping.

## 2021-04-23 NOTE — Progress Notes (Signed)
History:   Tami Crawford is a 35 y.o. G3P2002 at 23w2dby early ultrasound (9 wk) being seen today for her first obstetrical visit.  Her obstetrical history is significant for group B strep colonizer. Patient does intend to breast feed. Pregnancy history fully reviewed.  Patient reports she overall is feeling well, nausea has improved from first trimester.   Reports some vaginal spotting earlier this week that has stopped. Seen in the MAU for same concern on 12/9.  Denies any abdominal pain.    No french interpreter was used for visit due to patient preference.    HISTORY: OB History  Gravida Para Term Preterm AB Living  '3 2 2 ' 0 0 2  SAB IAB Ectopic Multiple Live Births  0 0 0 0 2    # Outcome Date GA Lbr Len/2nd Weight Sex Delivery Anes PTL Lv  3 Current           2 Term 01/26/15 [id number]:16 / 00:29 7 lb 13.2 oz (3.549 kg) M Vag-Spont EPI  LIV     Name: SADOU-HAMANI,BOY Diandra     Apgar1: 9  Apgar5: 9  1 Term 03/16/13 4088w4d:47 / 01:54 7 lb 6.5 oz (3.359 kg) M Vag-Spont EPI, Local  LIV     Name: MOSSI,ISMAEL     Apgar1: 8  Apgar5: 9    Last pap smear was done several years ago.   Past Medical History:  Diagnosis Date   Medical history non-contributory    Past Surgical History:  Procedure Laterality Date   PERINEAL LACERATION REPAIR N/A 04/28/2013   Procedure: SUTURE REPAIR PERINEAL LACERATION;  Surgeon: EveThurnell LoseD;  [facility];  Service: Gynecology;  Laterality: N/A;   No family history on file. Social History   Tobacco Use   Smoking status: Never   Smokeless tobacco: Never  Vaping Use   Vaping Use: Never used  Substance Use Topics   Alcohol use: No   Drug use: No   No Known Allergies Current Outpatient Medications on File Prior to Visit  Medication Sig Dispense Refill   acetaminophen (TYLENOL) 325 MG tablet Take 650 mg by mouth every 6 (six) hours as needed.     calcium carbonate (TUMS - DOSED IN MG ELEMENTAL CALCIUM) 500 MG chewable tablet  Chew 1 tablet by mouth daily.     dimenhyDRINATE (DRAMAMINE PO) Take by mouth.     Prenatal Vit-Fe Fumarate-FA (PRENATAL MULTIVITAMIN) TABS tablet Take 1 tablet by mouth daily at 12 noon.     promethazine (PHENERGAN) 25 MG tablet Take 1 tablet (25 mg total) by mouth every 6 (six) hours as needed for nausea or vomiting. 30 tablet 0   ondansetron (ZOFRAN ODT) 4 MG disintegrating tablet Take 1 tablet (4 mg total) by mouth every 8 (eight) hours as needed. (Patient not taking: Reported on 04/23/2021) 20 tablet 0   No current facility-administered medications on file prior to visit.    Review of Systems Pertinent items noted in HPI and remainder of comprehensive ROS otherwise negative. Physical Exam:   Vitals:   04/23/21 1007  BP: 119/84  Pulse: 94  Weight: 228 lb 3.2 oz (103.5 kg)   Fetal Heart Rate (bpm): 145  Constitutional: Well-developed, well-nourished pregnant female in no acute distress.  HEENT: PERRLA Skin: normal color and turgor, no rash Cardiovascular: normal rate & rhythm Respiratory: normal effort GI: Abd soft, non-tender, gravid appropriate for gestational age MS: Extremities nontender, no edema, normal ROM Neurologic: Alert and oriented x  4.  GU: no CVA tenderness Pelvic: NEFG, physiologic discharge, cervix clean. Small amount of dried blood present at the cervical os. No blood elsewhere or pooling.  Pap/swabs collected  Assessment:    Pregnancy: [id number]   Plan:    1. Supervision of low-risk pregnancy, first trimester Doing well.  - Culture, OB Urine - Genetic Screening - CBC/D/Plt+RPR+Rh+ABO+RubIgG... - Hemoglobin A1c - Blood Pressure Monitoring (BLOOD PRESSURE KIT) DEVI; 1 Device by Does not apply route once a week.  Dispense: 1 each; Refill: 0 - Comprehensive metabolic panel - Protein / creatinine ratio, urine  2. [redacted] weeks gestation of pregnancy  3. Encounter for screening for cervical cancer - Cytology - PAP( [place])  4. Vaginal bleeding before  [redacted] weeks gestation Dried blood present at os, has had this intermittently since known pregnancy. Reassuring FHT today. Encouraged pelvic rest. Vaginal swabs collected today. MAU precautions discussed.   5. History of gestational hypertension BP normal today. Baseline pre-e labs collected.   Initial labs drawn. Continue prenatal vitamins. Problem list reviewed and updated. Genetic Screening discussed: ordered. Ultrasound discussed; fetal anatomic survey: ordered. Anticipatory guidance about prenatal visits given including labs, ultrasounds, and testing. Discussed usage of Babyscripts and virtual visits as additional source of managing and completing prenatal visits in midst of coronavirus and pandemic.   Encouraged to complete MyChart Registration for her ability to review results, send requests, and have questions addressed.  The nature of [place] for [facility] with multiple MDs and Advanced Practice Providers was explained to patient; also emphasized that residents, students are part of our team. Routine obstetric precautions reviewed. Encouraged to seek out care at office or emergency room [facility] MAU preferred) for urgent and/or emergent concerns.  Return in about 4 weeks (around 05/21/2021) for [place].     Darrelyn Hillock, DO Ob Fellow

## 2021-04-24 LAB — CBC/D/PLT+RPR+RH+ABO+RUBIGG...
Antibody Screen: NEGATIVE
Basophils Absolute: 0 10*3/uL (ref 0.0–0.2)
Basos: 1 %
EOS (ABSOLUTE): 0.1 10*3/uL (ref 0.0–0.4)
Eos: 2 %
HCV Ab: <0.1 s/co ratio (ref 0.0–0.9)
HIV Screen 4th Generation wRfx: NONREACTIVE
Hematocrit: 31.2 % — ABNORMAL LOW (ref 34.0–46.6)
Hemoglobin: 11.1 g/dL (ref 11.1–15.9)
Hepatitis B Surface Ag: NEGATIVE
Immature Grans (Abs): 0 10*3/uL (ref 0.0–0.1)
Immature Granulocytes: 1 %
Lymphocytes Absolute: 1.8 10*3/uL (ref 0.7–3.1)
Lymphs: 30 %
MCH: 28.5 pg (ref 26.6–33.0)
MCHC: 35.6 g/dL (ref 31.5–35.7)
MCV: 80 fL (ref 79–97)
Monocytes Absolute: 0.6 10*3/uL (ref 0.1–0.9)
Monocytes: 11 %
Neutrophils Absolute: 3.4 10*3/uL (ref 1.4–7.0)
Neutrophils: 55 %
RBC: 3.89 x10E6/uL (ref 3.77–5.28)
RDW: 12.6 % (ref 11.7–15.4)
RPR Ser Ql: NONREACTIVE
Rh Factor: POSITIVE
Rubella Antibodies, IGG: 15.1 index (ref >0.99)
WBC: 6 10*3/uL (ref 3.4–10.8)

## 2021-04-24 LAB — COMPREHENSIVE METABOLIC PANEL
ALT: 11 IU/L (ref 0–32)
AST: 14 IU/L (ref 0–40)
Albumin/Globulin Ratio: 1.9 (ref 1.2–2.2)
Albumin: 4 g/dL (ref 3.8–4.8)
Alkaline Phosphatase: 63 IU/L (ref 44–121)
BUN/Creatinine Ratio: 17 (ref 9–23)
BUN: 9 mg/dL (ref 6–20)
Bilirubin Total: <0.2 mg/dL (ref 0.0–1.2)
CO2: 17 mmol/L — ABNORMAL LOW (ref 20–29)
Calcium: 9.2 mg/dL (ref 8.7–10.2)
Chloride: 106 mmol/L (ref 96–106)
Creatinine, Ser: 0.54 mg/dL — ABNORMAL LOW (ref 0.57–1.00)
Globulin, Total: 2.1 g/dL (ref 1.5–4.5)
Glucose: 102 mg/dL — ABNORMAL HIGH (ref 70–99)
Potassium: 3.8 mmol/L (ref 3.5–5.2)
Sodium: 135 mmol/L (ref 134–144)
Total Protein: 6.1 g/dL (ref 6.0–8.5)
eGFR: 125 mL/min/{1.73_m2} (ref >59)

## 2021-04-24 LAB — PROTEIN / CREATININE RATIO, URINE
Creatinine, Urine: 181.4 mg/dL
Protein, Ur: 14.7 mg/dL
Protein/Creat Ratio: 81 mg/g creat (ref 0–200)

## 2021-04-24 LAB — HCV INTERPRETATION

## 2021-04-24 LAB — HEMOGLOBIN A1C
Est. average glucose Bld gHb Est-mCnc: 151 mg/dL
Hgb A1c MFr Bld: 6.9 % — ABNORMAL HIGH (ref 4.8–5.6)

## 2021-04-25 ENCOUNTER — Encounter: Payer: Self-pay | Admitting: Family Medicine

## 2021-04-25 LAB — URINE CULTURE, OB REFLEX

## 2021-04-25 LAB — CULTURE, OB URINE

## 2021-04-29 ENCOUNTER — Telehealth: Payer: Self-pay | Admitting: Lactation Services

## 2021-04-29 DIAGNOSIS — R7309 Other abnormal glucose: Secondary | ICD-10-CM

## 2021-04-29 LAB — CYTOLOGY - PAP
Chlamydia: NEGATIVE
Comment: NEGATIVE
Comment: NEGATIVE
Comment: NEGATIVE
Comment: NORMAL
Diagnosis: NEGATIVE
High risk HPV: NEGATIVE
Neisseria Gonorrhea: NEGATIVE
Trichomonas: NEGATIVE

## 2021-04-29 MED ORDER — ACCU-CHEK GUIDE VI STRP: ORAL_STRIP | 12 refills | Status: DC

## 2021-04-29 MED ORDER — ACCU-CHEK GUIDE W/DEVICE KIT: 1.0000 | PACK | Freq: Once | 0 refills | Status: AC

## 2021-04-29 MED ORDER — ACCU-CHEK SOFTCLIX LANCETS MISC: 12 refills | Status: DC

## 2021-04-29 NOTE — Telephone Encounter (Signed)
Called patient with assistance of [place] [employer] [place], [place] # [id number].   Patient informed that her A1C is elevated indicating that she is most likely Type 2 Diabetic. Reviewed she will be checked again after delivery to determine if she has Type 2 Diabetes or Gestational Diabetes.   Patient reports she does not like sugar so is unclear why she would have diabetes, explained it can be related to and Carbohydrates, not just sugar and how her body uses insulin and that pregnancy can effect it even more.   Sent in supplies for checking blood sugars. Reviewed it is recommended that she check her blood sugars fasting and 2 hours after the first bite of breakfast, lunch and dinner. Reviewed her Diabetes Education appt is October 10, and that the Pharmacist can show her how to use the Glucometer and if she begins checking her blood sugars please write down and bring to appt.   Appointment date, time and location given.   Patient asked if she needs to avoid any foods, reviewed she can eat normally but try to avoid simple sugars such as cake, candy, and soda, patient voiced understanding.   Patients questions answered and she is aware to call the office if she has further questions or concerns.

## 2021-04-29 NOTE — Telephone Encounter (Signed)
-----   Message from Patriciaann Clan, DO sent at 04/29/2021 12:06 PM EST ----- Can we please get scheduled with the diabetic nutrition coordinator and have supplies sent to the pharmacy?   Thank you, Dr Higinio Plan

## 2021-04-30 ENCOUNTER — Encounter: Payer: Self-pay | Admitting: Radiology

## 2021-05-04 NOTE — L&D Delivery Note (Signed)
OB/GYN Faculty Practice Delivery Note  Tami Crawford is a 36 y.o. T7D2202 s/p SVD at [redacted]w[redacted]d She was admitted for IOL for GDMA2 vs T2DM. She was also diagnosed with intrapartum GHTN.   ROM: 7h 247mith clear fluid GBS Status: Negative/-- (05/18 1643)  Labor Progress: Initial SVE: closed. She was given a few doses of cytotec and then had a foley bulb placed. After the foley bulb came out, AROM performed and pitocin started. She then progressed slowly to complete.    Delivery Date/Time: 09/29/21 2155 Delivery: Called to room and patient was complete and pushing. Head delivered ROA. One loose nuchal cord present. Shoulder and body delivered in usual fashion. Infant with spontaneous cry, placed on mother's abdomen, dried and stimulated. Cord clamped x 2 after 1-minute delay, and cut by myself. Cord blood drawn. Placenta delivered spontaneously with gentle cord traction. Fundus firm with massage and Pitocin. Labia, perineum, vagina, and cervix inspected inspected with 1st degree. Repair done in the usual fashion 3-0 monocryl.  Baby Weight: pending  Placenta: Sent to L&D Complications: None Lacerations: 1st degree EBL: 250 mL Analgesia: Epidural   Infant:  APGAR (1 MIN):  8 APGAR (5 MINS):  9

## 2021-05-12 ENCOUNTER — Other Ambulatory Visit: Payer: Self-pay

## 2021-05-12 DIAGNOSIS — Z3492 Encounter for supervision of normal pregnancy, unspecified, second trimester: Secondary | ICD-10-CM

## 2021-05-12 DIAGNOSIS — E119 Type 2 diabetes mellitus without complications: Secondary | ICD-10-CM | POA: Insufficient documentation

## 2021-05-13 ENCOUNTER — Ambulatory Visit: Payer: Medicaid Other | Admitting: Registered"

## 2021-05-13 ENCOUNTER — Other Ambulatory Visit: Payer: Self-pay

## 2021-05-13 ENCOUNTER — Encounter: Payer: Medicaid Other | Attending: Family Medicine | Admitting: Registered"

## 2021-05-13 DIAGNOSIS — E119 Type 2 diabetes mellitus without complications: Secondary | ICD-10-CM

## 2021-05-13 DIAGNOSIS — R7309 Other abnormal glucose: Secondary | ICD-10-CM | POA: Insufficient documentation

## 2021-05-13 NOTE — Progress Notes (Signed)
Patient was seen for Type 2 Diabetes in pregnancy self-management on 05/13/2021. Patient here with husband, they declined interpreter.   Start time 1520 and End time 1620   Estimated due date: 10/13/21; [redacted]w[redacted]d  Clinical: Medications: reviewed Medical History: reviewed Labs: OGTT n/a, A1c 6.9%   Dietary and Lifestyle History: Pt states she eats a variety of foods, snacks on almond cookies, chips, and drinks lemonade, soda, black tea with honey about 1x/week.  Pt's husband states she sleeps about 12 hrs a day, wakes up, eats and then goes back to sleep.  Physical Activity: ADLs Stress: not assessed Sleep: not assessed  24 hr Recall: not assessed  NUTRITION INTERVENTION  Nutrition education (E-1) on the following topics:   Initial Follow-up  [x]  []  Difference between Type 2 diabetes and Gestational Diabetes [x]  []  Why dietary management is important in controlling blood glucose [x]  []  Effects each nutrient has on blood glucose levels [x]  []  Simple carbohydrates vs complex carbohydrates [x]  []  Fluid intake [x]  []  Creating a balanced meal plan [x]  []  Carbohydrate counting  [x]  []  When to check blood glucose levels [x]  []  Proper blood glucose monitoring techniques [x]  []  Effect of stress and stress reduction techniques  [x]  []  Exercise effect on blood glucose levels, appropriate exercise during pregnancy [x]  []  Importance of limiting caffeine and abstaining from alcohol and smoking [x]  []  Medications used for blood sugar control during pregnancy [x]  []  Hypoglycemia and rule of 15 [x]  []  Postpartum self care  Patient brought meter to visit to learn out how use ~1 hr postprandial: 173 mg/dL 2 cups of rice and collard greens  Patient instructed to monitor glucose levels: FBS: 60 - ? 95 mg/dL (some clinics use 90 for cutoff) 1 hour: ? 140 mg/dL 2 hour: ? 120 mg/dL  Patient received handouts: Nutrition Diabetes and Pregnancy Carbohydrate Counting List A1c Chart  Patient  will be seen for follow-up as needed.

## 2021-05-19 ENCOUNTER — Other Ambulatory Visit: Payer: Self-pay | Admitting: *Deleted

## 2021-05-19 ENCOUNTER — Encounter: Payer: Self-pay | Admitting: Family Medicine

## 2021-05-19 ENCOUNTER — Ambulatory Visit: Payer: Medicaid Other | Admitting: *Deleted

## 2021-05-19 ENCOUNTER — Ambulatory Visit (INDEPENDENT_AMBULATORY_CARE_PROVIDER_SITE_OTHER): Payer: Medicaid Other | Admitting: Nurse Practitioner

## 2021-05-19 ENCOUNTER — Other Ambulatory Visit: Payer: Self-pay

## 2021-05-19 ENCOUNTER — Ambulatory Visit: Payer: Medicaid Other | Attending: Family Medicine

## 2021-05-19 VITALS — BP 113/76 | HR 84

## 2021-05-19 VITALS — BP 125/89 | HR 102 | Wt 235.2 lb

## 2021-05-19 DIAGNOSIS — O0990 Supervision of high risk pregnancy, unspecified, unspecified trimester: Secondary | ICD-10-CM | POA: Diagnosis present

## 2021-05-19 DIAGNOSIS — E119 Type 2 diabetes mellitus without complications: Secondary | ICD-10-CM

## 2021-05-19 DIAGNOSIS — Z3A19 19 weeks gestation of pregnancy: Secondary | ICD-10-CM | POA: Diagnosis not present

## 2021-05-19 DIAGNOSIS — O99212 Obesity complicating pregnancy, second trimester: Secondary | ICD-10-CM

## 2021-05-19 DIAGNOSIS — O09292 Supervision of pregnancy with other poor reproductive or obstetric history, second trimester: Secondary | ICD-10-CM | POA: Diagnosis not present

## 2021-05-19 DIAGNOSIS — O24112 Pre-existing diabetes mellitus, type 2, in pregnancy, second trimester: Secondary | ICD-10-CM

## 2021-05-19 DIAGNOSIS — O0992 Supervision of high risk pregnancy, unspecified, second trimester: Secondary | ICD-10-CM

## 2021-05-19 DIAGNOSIS — Z363 Encounter for antenatal screening for malformations: Secondary | ICD-10-CM | POA: Insufficient documentation

## 2021-05-19 DIAGNOSIS — Z3492 Encounter for supervision of normal pregnancy, unspecified, second trimester: Secondary | ICD-10-CM

## 2021-05-19 DIAGNOSIS — O132 Gestational [pregnancy-induced] hypertension without significant proteinuria, second trimester: Secondary | ICD-10-CM

## 2021-05-19 DIAGNOSIS — Z8759 Personal history of other complications of pregnancy, childbirth and the puerperium: Secondary | ICD-10-CM

## 2021-05-19 MED ORDER — METFORMIN HCL 500 MG PO TABS: 500.0000 mg | ORAL_TABLET | Freq: Two times a day (BID) | ORAL | 1 refills | Status: DC

## 2021-05-19 MED ORDER — ASPIRIN EC 81 MG PO TBEC: 81.0000 mg | DELAYED_RELEASE_TABLET | Freq: Every day | ORAL | 11 refills | Status: DC

## 2021-05-19 NOTE — Progress Notes (Signed)
° ° °  Subjective:  Tami Crawford is a 36 y.o. G3P2002 at [redacted]w[redacted]d being seen today for ongoing prenatal care.  She is currently monitored for the following issues for this high-risk pregnancy and has History of gestational hypertension; Supervision of high risk pregnancy, antepartum; and T2DM (type 2 diabetes mellitus) ([place]) on their problem list.  Patient reports backache.  Contractions: Not present. Vag. Bleeding: None.  Movement: Present. Denies leaking of fluid.   The following portions of the patient's history were reviewed and updated as appropriate: allergies, current medications, past family history, past medical history, past social history, past surgical history and problem list. Problem list updated.  Objective:   Vitals:   05/19/21 1114  BP: 125/89  Pulse: (!) 102  Weight: 235 lb 3.2 oz (106.7 kg)    Fetal Status: Fetal Heart Rate (bpm): 159   Movement: Present     General:  Alert, oriented and cooperative. Patient is in no acute distress.  Skin: Skin is warm and dry. No rash noted.   Cardiovascular: Normal heart rate noted  Respiratory: Normal respiratory effort, no problems with respiration noted  Abdomen: Soft, gravid, appropriate for gestational age. Pain/Pressure: Absent     Pelvic:  Cervical exam deferred        Extremities: Normal range of motion.  Edema: None  Mental Status: Normal mood and affect. Normal behavior. Normal judgment and thought content.   Urinalysis:      Assessment and Plan:  Pregnancy: G3P2002 at [redacted]w[redacted]d  1. Encounter for supervision of high risk pregnancy in second trimester, antepartum Here with interpreter today  2. Type 2 diabetes mellitus without complication, unspecified whether long term insulin use (HCC) Glucose log for one week; Fasting 92-109 with 5/6 over 95.   2 hr after breakfast 93-129 with 1/6 over 120 All reading for 2 hr after lunch under 120 2 hr after dinner 102-128 with 2 of 3 readings over 120 Reviewed with Dr. Ernestina Patches.    Will start metformin 500 mg PO BID.  Can move to Metformin 1000 mg BID if needed but may need insulin if glucose readings still above target ranges.  Will also need 2 hr glucola postpartum to fully determine whether she is type 2 diabetes or gestational diabetes as her A1C was elevated at 15 weeks. Prescribed low dose aspirin - and staff will call her to let her know of this RX at her pharmacy. Has ultrasound today.  3. History of gestational hypertension BP normal today  4. [redacted] weeks gestation of pregnancy   Preterm labor symptoms and general obstetric precautions including but not limited to vaginal bleeding, contractions, leaking of fluid and fetal movement were reviewed in detail with the patient. Please refer to After Visit Summary for other counseling recommendations.  Return in about 4 weeks (around 06/16/2021) for in person [facility].  Earlie Server, RN, MSN, NP-BC Nurse Practitioner, [facility] for [employer], [facility] 05/19/2021 12:12 PM

## 2021-05-19 NOTE — Progress Notes (Signed)
Pt has Glucose Paper log today.

## 2021-05-21 ENCOUNTER — Encounter: Payer: Medicaid Other | Admitting: Nurse Practitioner

## 2021-05-22 ENCOUNTER — Other Ambulatory Visit: Payer: Self-pay

## 2021-05-22 ENCOUNTER — Encounter: Payer: Medicaid Other | Admitting: Obstetrics & Gynecology

## 2021-06-04 ENCOUNTER — Telehealth: Payer: Self-pay | Admitting: *Deleted

## 2021-06-10 ENCOUNTER — Other Ambulatory Visit: Payer: Self-pay

## 2021-06-10 ENCOUNTER — Encounter: Payer: Medicaid Other | Attending: Family Medicine | Admitting: Registered"

## 2021-06-10 ENCOUNTER — Ambulatory Visit (INDEPENDENT_AMBULATORY_CARE_PROVIDER_SITE_OTHER): Payer: Medicaid Other | Admitting: Registered"

## 2021-06-10 DIAGNOSIS — R7309 Other abnormal glucose: Secondary | ICD-10-CM | POA: Insufficient documentation

## 2021-06-10 DIAGNOSIS — O24119 Pre-existing diabetes mellitus, type 2, in pregnancy, unspecified trimester: Secondary | ICD-10-CM

## 2021-06-10 HISTORY — DX: Pre-existing type 2 diabetes mellitus, in pregnancy, unspecified trimester: O24.119

## 2021-06-10 NOTE — Progress Notes (Signed)
Patient was seen for Type 2 Diabetes in pregnancy self-management on 06/10/2021.    Start time 1613 and End time 1640   Estimated due date: 10/13/21; [redacted]w[redacted]d  Clinical: Medications: metformin 500 bid. Pt denies GI side effects Medical History: reviewed Labs: OGTT n/a, A1c 6.9%   SMBG: Patient forgot log sheet. Pt states she just came to the appointment from work. Pt reports most values are WNL. Pt reports in last 7 days FBS above target 1x  and 2x/ after lunch   Dietary and Lifestyle History: Pt states she stopped drinking lemonade and soda. Pt states 2 times per week having rice. Pt states she is hungry sometimes with cutting back carbs.   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 Walks around at lunchtime. Pt states she exercises daily in the morning 10 min and night 30 min using youtube video for pregnancy.   Physical Activity: 7x/week exercise videos for ~40 min Stress: not assessed Sleep: 7-8 hrs   24 hr Recall:  B: eggs, and 2 pieces brown bread (96 mg/dL 2 hrs after) L: Macaroni and chicken (108 mg/dL) D: salad, chicken, potato (112 mg/dL) Snacks: none Beverages: just water  NUTRITION INTERVENTION  Nutrition education (E-1) on the following topics:   Initial Follow-up  [x]  []  Difference between Type 2 diabetes and Gestational Diabetes [x]  []  Why dietary management is important in controlling blood glucose [x]  []  Effects each nutrient has on blood glucose levels [x]  []  Simple carbohydrates vs complex carbohydrates [x]  []  Fluid intake [x]  [x]  Creating a balanced meal plan [x]  []  Carbohydrate counting  [x]  []  When to check blood glucose levels [x]  []  Proper blood glucose monitoring techniques [x]  []  Effect of stress and stress reduction techniques  [x]  [x]  Exercise effect on blood glucose levels, appropriate exercise during pregnancy [x]  []  Importance of limiting caffeine and abstaining from alcohol and smoking [x]  []  Medications used for blood sugar control during  pregnancy [x]  []  Hypoglycemia and rule of 15 [x]  [x]  Postpartum self care  Patient instructed to monitor glucose levels: FBS: 60 - ? 95 mg/dL (some clinics use 90 for cutoff) 1 hour: ? 140 mg/dL 2 hour: ? 120 mg/dL  Patient received handouts: none  Patient will be seen for follow-up as needed.

## 2021-06-16 ENCOUNTER — Other Ambulatory Visit: Payer: Self-pay

## 2021-06-16 ENCOUNTER — Ambulatory Visit: Payer: Medicaid Other | Attending: Obstetrics and Gynecology

## 2021-06-16 ENCOUNTER — Ambulatory Visit: Payer: Medicaid Other | Admitting: *Deleted

## 2021-06-16 ENCOUNTER — Encounter: Payer: Self-pay | Admitting: *Deleted

## 2021-06-16 ENCOUNTER — Encounter: Payer: Self-pay | Admitting: Obstetrics & Gynecology

## 2021-06-16 ENCOUNTER — Ambulatory Visit (INDEPENDENT_AMBULATORY_CARE_PROVIDER_SITE_OTHER): Payer: Medicaid Other | Admitting: Obstetrics & Gynecology

## 2021-06-16 VITALS — BP 109/65 | HR 106 | Wt 239.1 lb

## 2021-06-16 VITALS — BP 100/61 | HR 92

## 2021-06-16 DIAGNOSIS — O24119 Pre-existing diabetes mellitus, type 2, in pregnancy, unspecified trimester: Secondary | ICD-10-CM

## 2021-06-16 DIAGNOSIS — O0990 Supervision of high risk pregnancy, unspecified, unspecified trimester: Secondary | ICD-10-CM

## 2021-06-16 DIAGNOSIS — Z3A23 23 weeks gestation of pregnancy: Secondary | ICD-10-CM

## 2021-06-16 DIAGNOSIS — O132 Gestational [pregnancy-induced] hypertension without significant proteinuria, second trimester: Secondary | ICD-10-CM | POA: Insufficient documentation

## 2021-06-16 DIAGNOSIS — O99212 Obesity complicating pregnancy, second trimester: Secondary | ICD-10-CM | POA: Insufficient documentation

## 2021-06-16 DIAGNOSIS — Z8759 Personal history of other complications of pregnancy, childbirth and the puerperium: Secondary | ICD-10-CM

## 2021-06-16 DIAGNOSIS — O24112 Pre-existing diabetes mellitus, type 2, in pregnancy, second trimester: Secondary | ICD-10-CM | POA: Diagnosis present

## 2021-06-16 NOTE — Progress Notes (Signed)
PRENATAL VISIT NOTE  Subjective:  Tami Crawford is a 36 y.o. G3P2002 at [redacted]w[redacted]d being seen today for ongoing prenatal care.  She declined use of interpreter today. She is currently monitored for the following issues for this Crawford-risk pregnancy and has History of gestational hypertension; Supervision of Crawford risk pregnancy, antepartum; T2DM (type 2 diabetes mellitus) ([place]); and Pre-existing type 2 diabetes mellitus during pregnancy, antepartum on their problem list.  Patient reports no complaints.  Contractions: Not present. Vag. Bleeding: None.  Movement: Present. Denies leaking of fluid.   The following portions of the patient's history were reviewed and updated as appropriate: allergies, current medications, past family history, past medical history, past social history, past surgical history and problem list.   Objective:   Vitals:   06/16/21 1326  BP: 109/65  Pulse: (!) 106  Weight: 239 lb 1.6 oz (108.5 kg)    Fetal Status: Fetal Heart Rate (bpm): 164   Movement: Present     General:  Alert, oriented and cooperative. Patient is in no acute distress.  Skin: Skin is warm and dry. No rash noted.   Cardiovascular: Normal heart rate noted  Respiratory: Normal respiratory effort, no problems with respiration noted  Abdomen: Soft, gravid, appropriate for gestational age.  Pain/Pressure: Absent     Pelvic: Cervical exam deferred        Extremities: Normal range of motion.  Edema: None  Mental Status: Normal mood and affect. Normal behavior. Normal judgment and thought content.   Imaging: [place] MFM OB DETAIL +14 WK  Result Date: 05/19/2021 ----------------------------------------------------------------------  OBSTETRICS REPORT                       (Signed Final 05/19/2021 02:36 pm) ---------------------------------------------------------------------- Patient Info  ID #:       [id number]                          D.O.B.:  02-27-1986 (35 yrs)  Name:       Tami Crawford               Visit Date: 05/19/2021 12:49 pm ---------------------------------------------------------------------- Performed By  Attending:        Tama High MD        Ref. Address:     [address]                                                             [place], [place]  Performed By:     Marye Round Pharisien     Secondary Phy.:   [facility]                    RDMS  BEARD DO  Referred By:      [facility]          Location[facility]         [facility]                                [facility] at                                                             [employer] [facility]                                                             Women ---------------------------------------------------------------------- Orders  #  Description                           Code        Ordered By  1  [place] MFM OB DETAIL +14 [place]               76811.01    Darrelyn Hillock ----------------------------------------------------------------------  #  Order #                     Accession #                Episode #  1  [id number]                   [id number]                 [id number] ---------------------------------------------------------------------- Indications  Pre-existing diabetes, type 2, in pregnancy,   O24.112  second trimester  Obesity complicating pregnancy, second         O99.212  trimester (BMI 36.01)  Poor obstetric history: Previous               O09.299  preeclampsia / eclampsia/gestational HTN  LR NIPS/Negative Horizon  Encounter for antenatal screening for          Z36.3  malformations  [redacted] weeks gestation of pregnancy                Z3A.19 ---------------------------------------------------------------------- Fetal Evaluation  Num Of Fetuses:         1  Fetal Heart Rate(bpm):  160  Cardiac Activity:       Observed  Presentation:           Cephalic  Placenta:               Anterior  P. Cord  Insertion:      Visualized, central  Amniotic Fluid  AFI FV:      Within normal limits                              Largest Pocket(cm)  5.6 ---------------------------------------------------------------------- Biometry  BPD:      43.9  mm     G. Age:  19w 2d         63  %    CI:        76.18   %    70 - 86                                                          FL/HC:      19.3   %    16.1 - 18.3  HC:      159.4  mm     G. Age:  18w 5d         32  %    HC/AC:      1.08        1.09 - 1.39  AC:       148   mm     G. Age:  20w 1d         80  %    FL/BPD:     70.2   %  FL:       30.8  mm     G. Age:  19w 4d         64  %    FL/AC:      20.8   %    20 - 24  HUM:        29  mm     G. Age:  19w 3d         61  %  CER:      18.9  mm     G. Age:  18w 4d         15  %  NFT:       3.6  mm  LV:          5  mm  CM:        5.3  mm  Est. FW:     308  gm    0 lb 11 oz      85  % ---------------------------------------------------------------------- OB History  Blood Type:   O+  Gravidity:    3         Term:   2  Living:       2 ---------------------------------------------------------------------- Gestational Age  LMP:           18w 0d        Date:  01/13/21                 EDD:   10/20/21  U/S Today:     19w 3d                                        EDD:   10/10/21  Best:          19w 0d     Det. By:  Early Exam  (03/11/21)   EDD:   10/13/21 ---------------------------------------------------------------------- Anatomy  Cranium:               Appears normal         LVOT:  Appears normal  Cavum:                 Appears normal         Aortic Arch:            Not well visualized  Ventricles:            Appears normal         Ductal Arch:            Appears normal  Choroid Plexus:        Appears normal         Diaphragm:              Appears normal  Cerebellum:            Appears normal         Stomach:                Appears normal, left                                                                         sided  Posterior Fossa:       Appears normal         Abdomen:                Appears normal  Nuchal Fold:           Appears normal         Abdominal Wall:         Appears nml (cord                                                                        insert, abd wall)  Face:                  Profile appears        Cord Vessels:           Appears normal (3                         normal                                         vessel cord)  Lips:                  Not well visualized    Kidneys:                Appear normal  Palate:                Not well visualized    Bladder:                Appears normal  Thoracic:              Appears normal  Spine:                  Appears normal  Heart:                 Appears normal         Upper Extremities:      Appears normal                         (4CH, axis, and                         situs)  RVOT:                  Appears normal         Lower Extremities:      Appears normal  Other:  Fetus appears to be a female. VC, 3VV and 3VTV visualized.          Heels/feet and open hands/5th digits visualized. Nasal bone          visualized. Technically difficult due to fetal position. ---------------------------------------------------------------------- Cervix Uterus Adnexa  Cervix  Length:           4.53  cm.  Normal appearance by transabdominal scan.  Uterus  Normal shape and size.  Right Ovary  Not visualized.  Left Ovary  Not visualized.  Cul De Sac  No free fluid seen.  Adnexa  No adnexal mass visualized. ---------------------------------------------------------------------- Impression  G3 P2. Patient is here for fetal anatomy scan.  She has type  2 diabetes and takes metformin for control.  She reports that  diabetes is still not well controlled.  Most recent hemoglobin  A1c was 6.9%.  Patient gives history of vaginal bleeding early  in pregnancy.  On cell-free fetal DNA screening, the risks of fetal  aneuploidies are not increased .  Obstetric  history significant for 2 term vaginal deliveries.  We performed fetal anatomy scan. No makers of  aneuploidies or fetal structural defects are seen. Fetal  biometry is consistent with her previously-established dates.  Amniotic fluid is normal and good fetal activity is seen.  Patient understands the limitations of ultrasound in detecting  fetal anomalies.  As maternal obesity limits resolution of images, failure to  detect anomalies are more common .  I discussed the importance of good blood glucose control to  prevent adverse outcomes.  I explained and recommended  fetal echocardiography. ---------------------------------------------------------------------- Recommendations  -An appointment was made for her to return in 4 weeks for  fetal growth assessment and completion of fetal anatomy  (face).  -We have requested an appointment for fetal  echocardiography ([facility]) ----------------------------------------------------------------------                  Tami High, MD Electronically Signed Final Report   05/19/2021 02:36 pm ----------------------------------------------------------------------   Assessment and Plan:  Pregnancy: G3P2002 at [redacted]w[redacted]d 1. Pre-existing type 2 diabetes mellitus during pregnancy, antepartum Had fetal ECHO today, she reports she was told everything was fine, no report yet in [place]. Ophthalmology referral done. Did not bring log, reports good BS, fasting 90s, PP 108-120s. Continue Metformin 500 mg po bid. Continue scans as per MFM, follow up scan today. - Ambulatory referral to Ophthalmology  2. History of gestational hypertension Stable BP, continue ASA  3. [redacted] weeks gestation of pregnancy 4. Supervision of Crawford risk pregnancy, antepartum No other concerns. Preterm labor symptoms and general obstetric precautions including but not  limited to vaginal bleeding, contractions, leaking of fluid and fetal movement were reviewed in detail with the patient. Please refer to  After Visit Summary for other counseling recommendations.   Return in about 4 weeks (around 07/14/2021) for OFFICE OB VISIT (MD only).  Future Appointments  Date Time Provider [place]  06/16/2021  3:00 PM [facility] NURSE [facility]  06/16/2021  3:15 PM [facility] US2 [facility]    Verita Schneiders, MD

## 2021-06-17 ENCOUNTER — Other Ambulatory Visit: Payer: Self-pay | Admitting: *Deleted

## 2021-06-17 DIAGNOSIS — Z8759 Personal history of other complications of pregnancy, childbirth and the puerperium: Secondary | ICD-10-CM

## 2021-06-17 DIAGNOSIS — Z6836 Body mass index (BMI) 36.0-36.9, adult: Secondary | ICD-10-CM

## 2021-06-17 DIAGNOSIS — O24112 Pre-existing diabetes mellitus, type 2, in pregnancy, second trimester: Secondary | ICD-10-CM

## 2021-06-18 ENCOUNTER — Other Ambulatory Visit: Payer: Self-pay

## 2021-07-15 ENCOUNTER — Ambulatory Visit: Payer: Medicaid Other | Admitting: *Deleted

## 2021-07-15 ENCOUNTER — Ambulatory Visit: Payer: Medicaid Other | Attending: Obstetrics and Gynecology

## 2021-07-15 ENCOUNTER — Encounter: Payer: Self-pay | Admitting: *Deleted

## 2021-07-15 ENCOUNTER — Ambulatory Visit (INDEPENDENT_AMBULATORY_CARE_PROVIDER_SITE_OTHER): Payer: Medicaid Other | Admitting: Obstetrics and Gynecology

## 2021-07-15 ENCOUNTER — Other Ambulatory Visit: Payer: Self-pay | Admitting: *Deleted

## 2021-07-15 ENCOUNTER — Other Ambulatory Visit: Payer: Self-pay

## 2021-07-15 ENCOUNTER — Encounter: Payer: Self-pay | Admitting: Family Medicine

## 2021-07-15 VITALS — BP 106/62 | HR 96

## 2021-07-15 VITALS — BP 113/69 | HR 115 | Wt 241.0 lb

## 2021-07-15 DIAGNOSIS — O24112 Pre-existing diabetes mellitus, type 2, in pregnancy, second trimester: Secondary | ICD-10-CM | POA: Insufficient documentation

## 2021-07-15 DIAGNOSIS — O24119 Pre-existing diabetes mellitus, type 2, in pregnancy, unspecified trimester: Secondary | ICD-10-CM

## 2021-07-15 DIAGNOSIS — Z6836 Body mass index (BMI) 36.0-36.9, adult: Secondary | ICD-10-CM | POA: Insufficient documentation

## 2021-07-15 DIAGNOSIS — Z789 Other specified health status: Secondary | ICD-10-CM

## 2021-07-15 DIAGNOSIS — E119 Type 2 diabetes mellitus without complications: Secondary | ICD-10-CM

## 2021-07-15 DIAGNOSIS — Z8759 Personal history of other complications of pregnancy, childbirth and the puerperium: Secondary | ICD-10-CM

## 2021-07-15 DIAGNOSIS — O0990 Supervision of high risk pregnancy, unspecified, unspecified trimester: Secondary | ICD-10-CM

## 2021-07-15 DIAGNOSIS — Z3A27 27 weeks gestation of pregnancy: Secondary | ICD-10-CM

## 2021-07-15 DIAGNOSIS — O09292 Supervision of pregnancy with other poor reproductive or obstetric history, second trimester: Secondary | ICD-10-CM

## 2021-07-15 DIAGNOSIS — Z23 Encounter for immunization: Secondary | ICD-10-CM | POA: Diagnosis not present

## 2021-07-15 DIAGNOSIS — Z6838 Body mass index (BMI) 38.0-38.9, adult: Secondary | ICD-10-CM

## 2021-07-15 DIAGNOSIS — O24312 Unspecified pre-existing diabetes mellitus in pregnancy, second trimester: Secondary | ICD-10-CM

## 2021-07-15 DIAGNOSIS — Z758 Other problems related to medical facilities and other health care: Secondary | ICD-10-CM

## 2021-07-15 DIAGNOSIS — O99210 Obesity complicating pregnancy, unspecified trimester: Secondary | ICD-10-CM

## 2021-07-15 DIAGNOSIS — O99212 Obesity complicating pregnancy, second trimester: Secondary | ICD-10-CM

## 2021-07-15 DIAGNOSIS — O09299 Supervision of pregnancy with other poor reproductive or obstetric history, unspecified trimester: Secondary | ICD-10-CM

## 2021-07-15 HISTORY — DX: Other problems related to medical facilities and other health care: Z75.8

## 2021-07-15 MED ORDER — METFORMIN HCL 500 MG PO TABS: 500.0000 mg | ORAL_TABLET | Freq: Two times a day (BID) | ORAL | 1 refills | Status: DC

## 2021-07-15 MED ORDER — PROMETHAZINE HCL 25 MG PO TABS: 25.0000 mg | ORAL_TABLET | Freq: Four times a day (QID) | ORAL | 0 refills | Status: DC | PRN

## 2021-07-15 NOTE — Progress Notes (Signed)
Patient declined [place] interpreter. ? ?Today's concerns: ? ?Need refill on Metformin Rx ? ?Pain in pelvis and lower back. She stated that is "comes and goes"  ? ?Reports vomiting and would like Rx to help  ? ?Tdap administered into right deltoid without any complications  ? ?

## 2021-07-15 NOTE — Progress Notes (Signed)
? ?  PRENATAL VISIT NOTE ? ?Subjective:  ?Tami Crawford is a 36 y.o. G3P2002 at 21w1dbeing seen today for ongoing prenatal care.  She is currently monitored for the following issues for this high-risk pregnancy and has History of gestational hypertension; Supervision of high risk pregnancy, antepartum; T2DM (type 2 diabetes mellitus) ([place]; Pre-existing type 2 diabetes mellitus during pregnancy, antepartum; Obesity in pregnancy; BMI 38.0-38.9,adult; and Language barrier on their problem list. ? ?Patient reports no complaints.  Contractions: Not present. Vag. Bleeding: None.  Movement: Present. Denies leaking of fluid.  ? ?The following portions of the patient's history were reviewed and updated as appropriate: allergies, current medications, past family history, past medical history, past social history, past surgical history and problem list.  ? ?Objective:  ? ?Vitals:  ? 07/15/21 1111  ?BP: 113/69  ?Pulse: (!) 115  ?Weight: 241 lb (109.3 kg)  ? ? ?Fetal Status: Fetal Heart Rate (bpm): 151   Movement: Present    ? ?General:  Alert, oriented and cooperative. Patient is in no acute distress.  ?Skin: Skin is warm and dry. No rash noted.   ?Cardiovascular: Normal heart rate noted  ?Respiratory: Normal respiratory effort, no problems with respiration noted  ?Abdomen: Soft, gravid, appropriate for gestational age.  Pain/Pressure: Absent     ?Pelvic: Cervical exam deferred        ?Extremities: Normal range of motion.  Edema: None  ?Mental Status: Normal mood and affect. Normal behavior. Normal judgment and thought content.  ? ?Assessment and Plan:  ?Pregnancy: G3P2002 at 252w1d1. [redacted] weeks gestation of pregnancy ?Routine care. Birth control options d/w pt ?- Tdap vaccine greater than or equal to 7yo IM ?- CBC ?- HIV Antibody (routine testing w rflx) ?- RPR ? ?2. BMI 38.0-38.9,adult ? ?3. Obesity in pregnancy ? ?4. Language barrier ?Interpreter declined ? ?5. Supervision of high risk pregnancy, antepartum ? ?6.  Pre-existing type 2 diabetes mellitus during pregnancy, antepartum ?Normal CBGs on metformin 500 bid. Has growth u/s later today. D/w her re: starting ap testing at 32wks ? ?7. History of gestational hypertension ?Continue low dose asa ? ?Preterm labor symptoms and general obstetric precautions including but not limited to vaginal bleeding, contractions, leaking of fluid and fetal movement were reviewed in detail with the patient. ?Please refer to After Visit Summary for other counseling recommendations.  ? ?No follow-ups on file. ? ?Future Appointments  ?Date Time Provider [place]?07/15/2021 12:30 PM [facility] NURSE [facility]  ?07/15/2021 12:45 PM [facility] US4 [facility]  ?07/29/2021  3:15 PM [facility]namae Jude[facility]? ? ?ChAletha HalimMD ? ?

## 2021-07-16 LAB — CBC
Hematocrit: 35.5 % (ref 34.0–46.6)
Hemoglobin: 12.3 g/dL (ref 11.1–15.9)
MCH: 28.3 pg (ref 26.6–33.0)
MCHC: 34.6 g/dL (ref 31.5–35.7)
MCV: 82 fL (ref 79–97)
Platelets: 211 10*3/uL (ref 150–450)
RBC: 4.34 x10E6/uL (ref 3.77–5.28)
RDW: 13 % (ref 11.7–15.4)
WBC: 5.6 10*3/uL (ref 3.4–10.8)

## 2021-07-16 LAB — HIV ANTIBODY (ROUTINE TESTING W REFLEX): HIV Screen 4th Generation wRfx: NONREACTIVE

## 2021-07-16 LAB — RPR: RPR Ser Ql: NONREACTIVE

## 2021-07-29 ENCOUNTER — Ambulatory Visit (INDEPENDENT_AMBULATORY_CARE_PROVIDER_SITE_OTHER): Payer: Medicaid Other | Admitting: Family Medicine

## 2021-07-29 ENCOUNTER — Other Ambulatory Visit: Payer: Self-pay

## 2021-07-29 VITALS — BP 123/75 | HR 105 | Wt 244.0 lb

## 2021-07-29 DIAGNOSIS — O24119 Pre-existing diabetes mellitus, type 2, in pregnancy, unspecified trimester: Secondary | ICD-10-CM

## 2021-07-29 DIAGNOSIS — O0990 Supervision of high risk pregnancy, unspecified, unspecified trimester: Secondary | ICD-10-CM

## 2021-07-29 DIAGNOSIS — Z789 Other specified health status: Secondary | ICD-10-CM

## 2021-07-29 NOTE — Progress Notes (Signed)
? ?  PRENATAL VISIT NOTE ? ?Subjective:  ?Tami Crawford is a 36 y.o. G3P2002 at 62w1dbeing seen today for ongoing prenatal care.  She is currently monitored for the following issues for this high-risk pregnancy and has History of gestational hypertension; Supervision of high risk pregnancy, antepartum; T2DM (type 2 diabetes mellitus) ([place]; Pre-existing type 2 diabetes mellitus during pregnancy, antepartum; Obesity in pregnancy; BMI 38.0-38.9,adult; and Language barrier on their problem list. ? ?Patient reports no complaints.  Contractions: Not present. Vag. Bleeding: None.  Movement: Present. Denies leaking of fluid.  ? ?The following portions of the patient's history were reviewed and updated as appropriate: allergies, current medications, past family history, past medical history, past social history, past surgical history and problem list.  ? ?Objective:  ? ?Vitals:  ? 07/29/21 1523  ?BP: 123/75  ?Pulse: (!) 105  ?Weight: 244 lb (110.7 kg)  ? ? ?Fetal Status: Fetal Heart Rate (bpm): 150   Movement: Present    ? ?General:  Alert, oriented and cooperative. Patient is in no acute distress.  ?Skin: Skin is warm and dry. No rash noted.   ?Cardiovascular: Normal heart rate noted  ?Respiratory: Normal respiratory effort, no problems with respiration noted  ?Abdomen: Soft, gravid, appropriate for gestational age.  Pain/Pressure: Present     ?Pelvic: Cervical exam deferred        ?Extremities: Normal range of motion.  Edema: Trace  ?Mental Status: Normal mood and affect. Normal behavior. Normal judgment and thought content.  ? ?Assessment and Plan:  ?Pregnancy: [id number] 229w1d1. Pre-existing type 2 diabetes mellitus during pregnancy, antepartum ? ?Continue Metformin ?CBGs ok ?Has F/u for growth scheduled ?On ASA ? ?2. Supervision of high risk pregnancy, antepartum ?Continue prenatal care. ? ?3. Language barrier ?Speaks English well. ? ?Preterm labor symptoms and general obstetric precautions including but not  limited to vaginal bleeding, contractions, leaking of fluid and fetal movement were reviewed in detail with the patient. ?Please refer to After Visit Summary for other counseling recommendations.  ? ?Return in 2 weeks (on 08/12/2021). ? ?Future Appointments  ?Date Time Provider [place]?08/14/2021  3:55 PM NeCaren MacadamMD [facility]?08/19/2021  2:15 PM WMC-MFC NURSE [facility]-MFC [facility]  ?08/19/2021  2:30 PM WMC-MFC US3 WMC-MFCUS WMC  ? ? ?TaDonnamae JudeMD ? ?

## 2021-08-14 ENCOUNTER — Encounter: Payer: Self-pay | Admitting: Family Medicine

## 2021-08-14 ENCOUNTER — Ambulatory Visit (INDEPENDENT_AMBULATORY_CARE_PROVIDER_SITE_OTHER): Payer: Medicaid Other | Admitting: Family Medicine

## 2021-08-14 VITALS — BP 105/71 | HR 135 | Wt 241.8 lb

## 2021-08-14 DIAGNOSIS — Z789 Other specified health status: Secondary | ICD-10-CM

## 2021-08-14 DIAGNOSIS — O24119 Pre-existing diabetes mellitus, type 2, in pregnancy, unspecified trimester: Secondary | ICD-10-CM

## 2021-08-14 DIAGNOSIS — O0990 Supervision of high risk pregnancy, unspecified, unspecified trimester: Secondary | ICD-10-CM

## 2021-08-14 DIAGNOSIS — Z8759 Personal history of other complications of pregnancy, childbirth and the puerperium: Secondary | ICD-10-CM

## 2021-08-14 DIAGNOSIS — O99210 Obesity complicating pregnancy, unspecified trimester: Secondary | ICD-10-CM

## 2021-08-14 NOTE — Progress Notes (Signed)
? ?  PRENATAL VISIT NOTE ? ?Subjective:  ?Tami Crawford is a 36 y.o. G3P2002 at 90w3dbeing seen today for ongoing prenatal care.  She is currently monitored for the following issues for this high-risk pregnancy and has History of gestational hypertension; Supervision of high risk pregnancy, antepartum; T2DM (type 2 diabetes mellitus) ([place]; Pre-existing type 2 diabetes mellitus during pregnancy, antepartum; Obesity in pregnancy; BMI 38.0-38.9,adult; and Language barrier on their problem list. ? ?Patient reports no complaints.  Contractions: Not present. Vag. Bleeding: None.  Movement: Present. Denies leaking of fluid.  ? ?The following portions of the patient's history were reviewed and updated as appropriate: allergies, current medications, past family history, past medical history, past social history, past surgical history and problem list.  ? ?Objective:  ? ?Vitals:  ? 08/14/21 1620  ?BP: 105/71  ?Pulse: (!) 135  ?Weight: 241 lb 12.8 oz (109.7 kg)  ? ? ?Fetal Status: Fetal Heart Rate (bpm): 156   Movement: Present    ? ?General:  Alert, oriented and cooperative. Patient is in no acute distress.  ?Skin: Skin is warm and dry. No rash noted.   ?Cardiovascular: Normal heart rate noted  ?Respiratory: Normal respiratory effort, no problems with respiration noted  ?Abdomen: Soft, gravid, appropriate for gestational age.  Pain/Pressure: Present     ?Pelvic: Cervical exam deferred        ?Extremities: Normal range of motion.  Edema: Trace  ?Mental Status: Normal mood and affect. Normal behavior. Normal judgment and thought content.  ? ?Assessment and Plan:  ?Pregnancy: [id number] 341w3d1. Pre-existing type 2 diabetes mellitus during pregnancy, antepartum ?Fastings 70-80s PP 90s-120. No log today  ?FH is appropriate  ?Continue metformin ?Antenatal testing starting next week, needs weekly BPP ? ?2. Supervision of high risk pregnancy, antepartum ?Up to date ?IOl at 39 wk ? ?3. Obesity in pregnancy ?TWG=11 lb 12.8 oz  (5.352 kg)  ? ?4. Language barrier ?[place].  ? ?5. History of gestational hypertension ?BP WNL ? ?Preterm labor symptoms and general obstetric precautions including but not limited to vaginal bleeding, contractions, leaking of fluid and fetal movement were reviewed in detail with the patient. ?Please refer to After Visit Summary for other counseling recommendations.  ? ?Return in about 2 weeks (around 08/28/2021) for Routine prenatal care. ? ?Future Appointments  ?Date Time Provider [place]?08/19/2021  2:15 PM WMC-MFC NURSE WMC-MFC [facility]  ?08/19/2021  2:30 PM WMC-MFC US3 WMC-MFCUS WMC  ?09/01/2021  4:15 PM ErChancy MilroyMD [facility]? ? ?KiCaren MacadamMD ?

## 2021-08-18 ENCOUNTER — Telehealth: Payer: Self-pay | Admitting: *Deleted

## 2021-08-18 NOTE — Telephone Encounter (Signed)
Called pt with [facility] interpreter # [phone] and she did not answer. Message was left stating that I am calling regarding her appointments in this office. We will call her back another time. Pt needs to be informed that Dr. Ernestina Patches requested weekly NST/BPP starting next week. This has been scheduled on 4/27 @ 10:15 am and 5/4 @ 3:15 pm. She will see the doctor on both days as well. The appointment which was scheduled on 5/1 has been cancelled. Also she needs to keep the ultrasound appointment on 4/18 @ 2:15pm in [facility] as scheduled.  ?

## 2021-08-19 ENCOUNTER — Ambulatory Visit: Payer: Medicaid Other | Admitting: *Deleted

## 2021-08-19 ENCOUNTER — Ambulatory Visit: Payer: Medicaid Other | Attending: Maternal & Fetal Medicine

## 2021-08-19 VITALS — BP 117/68 | HR 101

## 2021-08-19 DIAGNOSIS — O24313 Unspecified pre-existing diabetes mellitus in pregnancy, third trimester: Secondary | ICD-10-CM | POA: Diagnosis not present

## 2021-08-19 DIAGNOSIS — O99212 Obesity complicating pregnancy, second trimester: Secondary | ICD-10-CM | POA: Diagnosis present

## 2021-08-19 DIAGNOSIS — E669 Obesity, unspecified: Secondary | ICD-10-CM

## 2021-08-19 DIAGNOSIS — O09293 Supervision of pregnancy with other poor reproductive or obstetric history, third trimester: Secondary | ICD-10-CM

## 2021-08-19 DIAGNOSIS — O99213 Obesity complicating pregnancy, third trimester: Secondary | ICD-10-CM | POA: Diagnosis not present

## 2021-08-19 DIAGNOSIS — Z3A32 32 weeks gestation of pregnancy: Secondary | ICD-10-CM

## 2021-08-19 DIAGNOSIS — O24312 Unspecified pre-existing diabetes mellitus in pregnancy, second trimester: Secondary | ICD-10-CM | POA: Diagnosis present

## 2021-08-19 DIAGNOSIS — O09292 Supervision of pregnancy with other poor reproductive or obstetric history, second trimester: Secondary | ICD-10-CM | POA: Insufficient documentation

## 2021-08-19 DIAGNOSIS — O0990 Supervision of high risk pregnancy, unspecified, unspecified trimester: Secondary | ICD-10-CM | POA: Insufficient documentation

## 2021-08-19 NOTE — Telephone Encounter (Signed)
Called patient with assistance of [place] [employer] [place] # [phone].  ? ?Patient did not answer. LM for her to call the office at [phone] at her earliest convenience.  ? ?Called husbands number [phone] and he did not answer and voicemail not set up so not able to leave a message.  ? ?Will send letter to patient.  ? ? ?

## 2021-08-20 ENCOUNTER — Other Ambulatory Visit: Payer: Self-pay | Admitting: *Deleted

## 2021-08-20 DIAGNOSIS — O24113 Pre-existing diabetes mellitus, type 2, in pregnancy, third trimester: Secondary | ICD-10-CM

## 2021-08-28 ENCOUNTER — Ambulatory Visit (INDEPENDENT_AMBULATORY_CARE_PROVIDER_SITE_OTHER): Payer: Medicaid Other

## 2021-08-28 ENCOUNTER — Ambulatory Visit: Payer: Medicaid Other | Admitting: *Deleted

## 2021-08-28 VITALS — BP 115/61 | HR 102 | Wt 246.9 lb

## 2021-08-28 DIAGNOSIS — O24119 Pre-existing diabetes mellitus, type 2, in pregnancy, unspecified trimester: Secondary | ICD-10-CM

## 2021-08-28 NOTE — Progress Notes (Signed)

## 2021-08-30 NOTE — Progress Notes (Signed)
?  Patient seen and assessed by nursing staff.  Agree with documentation and plan. ? ?NST:  Baseline: 150 bpm, Variability: Good {> 6 bpm), Accelerations: Reactive, and Decelerations: Absent ? ?

## 2021-09-01 ENCOUNTER — Encounter: Payer: Medicaid Other | Admitting: Obstetrics and Gynecology

## 2021-09-04 ENCOUNTER — Ambulatory Visit (INDEPENDENT_AMBULATORY_CARE_PROVIDER_SITE_OTHER): Payer: Medicaid Other

## 2021-09-04 ENCOUNTER — Other Ambulatory Visit: Payer: Medicaid Other

## 2021-09-04 ENCOUNTER — Ambulatory Visit (INDEPENDENT_AMBULATORY_CARE_PROVIDER_SITE_OTHER): Payer: Medicaid Other | Admitting: General Practice

## 2021-09-04 ENCOUNTER — Ambulatory Visit (INDEPENDENT_AMBULATORY_CARE_PROVIDER_SITE_OTHER): Payer: Medicaid Other | Admitting: Family Medicine

## 2021-09-04 VITALS — BP 107/69 | HR 118 | Wt 246.0 lb

## 2021-09-04 DIAGNOSIS — O24119 Pre-existing diabetes mellitus, type 2, in pregnancy, unspecified trimester: Secondary | ICD-10-CM

## 2021-09-04 DIAGNOSIS — Z8759 Personal history of other complications of pregnancy, childbirth and the puerperium: Secondary | ICD-10-CM

## 2021-09-04 DIAGNOSIS — O0990 Supervision of high risk pregnancy, unspecified, unspecified trimester: Secondary | ICD-10-CM

## 2021-09-04 MED ORDER — METFORMIN HCL 1000 MG PO TABS: 500.0000 mg | ORAL_TABLET | Freq: Two times a day (BID) | ORAL | 2 refills | Status: DC

## 2021-09-04 NOTE — Progress Notes (Signed)
? ?  PRENATAL VISIT NOTE ? ?Subjective:  ?Tami Crawford is a 36 y.o. G3P2002 at 73w3dbeing seen today for ongoing prenatal care.  She is currently monitored for the following issues for this high-risk pregnancy and has History of gestational hypertension; Supervision of high risk pregnancy, antepartum; T2DM (type 2 diabetes mellitus) (HD[place]; Pre-existing type 2 diabetes mellitus during pregnancy, antepartum; Obesity in pregnancy; BMI 38.0-38.9,adult; and Language barrier on their problem list. ? ?Patient reports fatigue.  Contractions: Irritability. Vag. Bleeding: None.  Movement: Present. Denies leaking of fluid.  ? ?The following portions of the patient's history were reviewed and updated as appropriate: allergies, current medications, past family history, past medical history, past social history, past surgical history and problem list.  ? ?Objective:  ? ?Vitals:  ? 09/04/21 1433  ?BP: 107/69  ?Pulse: (!) 118  ?Weight: 246 lb (111.6 kg)  ? ? ?Fetal Status: Fetal Heart Rate (bpm): NST   Movement: Present    ? ?General:  Alert, oriented and cooperative. Patient is in no acute distress.  ?Skin: Skin is warm and dry. No rash noted.   ?Cardiovascular: Normal heart rate noted  ?Respiratory: Normal respiratory effort, no problems with respiration noted  ?Abdomen: Soft, gravid, appropriate for gestational age.  Pain/Pressure: Absent     ?Pelvic: Cervical exam deferred        ?Extremities: Normal range of motion.  Edema: Trace  ?Mental Status: Normal mood and affect. Normal behavior. Normal judgment and thought content.  ?NST:  Baseline: 150 bpm, Variability: Good {> 6 bpm), Accelerations: Reactive, and Decelerations: Absent ? ?Assessment and Plan:  ?Pregnancy: [id number] 348w3d1. Supervision of high risk pregnancy, antepartum ?Continue prenatal care. ? ?2. Pre-existing type 2 diabetes mellitus during pregnancy, antepartum ? ? ?CBGs are increasing--will increase metformin to '1000mg'$  bid ?Last u/s for growth was at  85% ?- metFORMIN (GLUCOPHAGE) 1000 MG tablet; Take 0.5 tablets (500 mg total) by mouth 2 (two) times daily with a meal.  Dispense: 60 tablet; Refill: 2 ? ?3. History of gestational hypertension ?BP is WNL ?On ASA ? ?Preterm labor symptoms and general obstetric precautions including but not limited to vaginal bleeding, contractions, leaking of fluid and fetal movement were reviewed in detail with the patient. ?Please refer to After Visit Summary for other counseling recommendations.  ? ?Return in 1 week (on 09/11/2021) for ob f/u, HRC, OB visit and BPP. ? ?Future Appointments  ?Date Time Provider [place]?09/04/2021  4:15 PM PrDonnamae JudeMD [facility]?09/11/2021 11:15 AM WMC-WOCA NST WMC-CWH WMC  ?09/16/2021  3:30 PM WMC-MFC NURSE WMC-MFC WMC  ?09/16/2021  3:45 PM WMC-MFC US1 WMC-MFCUS WMC  ?09/18/2021  2:15 PM PiAletha HalimMD [facility]?09/18/2021  3:15 PM WMC-WOCA NST WMC-CWH WMNorth [place]?09/25/2021  1:15 PM PrDonnamae JudeMD [facility]Uams [facility] CenterMD[facility]?09/25/2021  2:15 PM WMC-WOCA NST WMSt. Elizabeth Community HospitalMParis? ? ?TaDonnamae JudeMD ? ?

## 2021-09-04 NOTE — Progress Notes (Signed)
Pt informed that the ultrasound is considered a limited OB ultrasound and is not intended to be a complete ultrasound exam.  Patient also informed that the ultrasound is not being completed with the intent of assessing for fetal or placental anomalies or any pelvic abnormalities.  Explained that the purpose of today?s ultrasound is to assess for  BPP, presentation, and AFI.  Patient acknowledges the purpose of the exam and the limitations of the study.    ? ?Koren Bound RN BSN ?09/04/21 ? ?

## 2021-09-04 NOTE — Patient Instructions (Signed)
Vitamin B Complex for energy ?

## 2021-09-10 ENCOUNTER — Other Ambulatory Visit: Payer: Self-pay | Admitting: Obstetrics and Gynecology

## 2021-09-11 ENCOUNTER — Ambulatory Visit: Payer: Medicaid Other | Admitting: *Deleted

## 2021-09-11 ENCOUNTER — Ambulatory Visit (INDEPENDENT_AMBULATORY_CARE_PROVIDER_SITE_OTHER): Payer: Medicaid Other

## 2021-09-11 VITALS — BP 110/61 | HR 102

## 2021-09-11 DIAGNOSIS — O24119 Pre-existing diabetes mellitus, type 2, in pregnancy, unspecified trimester: Secondary | ICD-10-CM

## 2021-09-11 NOTE — Progress Notes (Signed)

## 2021-09-14 NOTE — Progress Notes (Signed)
?  Patient seen and assessed by nursing staff.  Agree with documentation and plan. ? ?NST:  Baseline: 145 bpm, Variability: Good {> 6 bpm), Accelerations: Reactive, and Decelerations: Absent ? ?

## 2021-09-16 ENCOUNTER — Ambulatory Visit: Payer: Medicaid Other | Attending: Obstetrics

## 2021-09-16 ENCOUNTER — Ambulatory Visit: Payer: Medicaid Other | Admitting: *Deleted

## 2021-09-16 VITALS — BP 106/67 | HR 108

## 2021-09-16 DIAGNOSIS — O24113 Pre-existing diabetes mellitus, type 2, in pregnancy, third trimester: Secondary | ICD-10-CM | POA: Diagnosis present

## 2021-09-16 DIAGNOSIS — O24119 Pre-existing diabetes mellitus, type 2, in pregnancy, unspecified trimester: Secondary | ICD-10-CM | POA: Diagnosis present

## 2021-09-16 DIAGNOSIS — E669 Obesity, unspecified: Secondary | ICD-10-CM

## 2021-09-16 DIAGNOSIS — O0990 Supervision of high risk pregnancy, unspecified, unspecified trimester: Secondary | ICD-10-CM

## 2021-09-16 DIAGNOSIS — E119 Type 2 diabetes mellitus without complications: Secondary | ICD-10-CM | POA: Diagnosis not present

## 2021-09-16 DIAGNOSIS — O99213 Obesity complicating pregnancy, third trimester: Secondary | ICD-10-CM

## 2021-09-16 DIAGNOSIS — O09293 Supervision of pregnancy with other poor reproductive or obstetric history, third trimester: Secondary | ICD-10-CM

## 2021-09-16 DIAGNOSIS — Z3A36 36 weeks gestation of pregnancy: Secondary | ICD-10-CM

## 2021-09-18 ENCOUNTER — Ambulatory Visit (INDEPENDENT_AMBULATORY_CARE_PROVIDER_SITE_OTHER): Payer: Medicaid Other | Admitting: Obstetrics and Gynecology

## 2021-09-18 ENCOUNTER — Other Ambulatory Visit: Payer: Medicaid Other

## 2021-09-18 ENCOUNTER — Other Ambulatory Visit (HOSPITAL_COMMUNITY)
Admission: RE | Admit: 2021-09-18 | Discharge: 2021-09-18 | Disposition: A | Discharge status: Home / Self Care | Payer: Medicaid Other | Source: Ambulatory Visit | Attending: Obstetrics and Gynecology | Admitting: Obstetrics and Gynecology

## 2021-09-18 VITALS — BP 124/81 | HR 100 | Wt 247.0 lb

## 2021-09-18 DIAGNOSIS — Z8759 Personal history of other complications of pregnancy, childbirth and the puerperium: Secondary | ICD-10-CM

## 2021-09-18 DIAGNOSIS — Z3A36 36 weeks gestation of pregnancy: Secondary | ICD-10-CM | POA: Insufficient documentation

## 2021-09-18 DIAGNOSIS — O24119 Pre-existing diabetes mellitus, type 2, in pregnancy, unspecified trimester: Secondary | ICD-10-CM

## 2021-09-18 DIAGNOSIS — O0990 Supervision of high risk pregnancy, unspecified, unspecified trimester: Secondary | ICD-10-CM | POA: Diagnosis present

## 2021-09-18 DIAGNOSIS — Z789 Other specified health status: Secondary | ICD-10-CM

## 2021-09-18 DIAGNOSIS — O99210 Obesity complicating pregnancy, unspecified trimester: Secondary | ICD-10-CM

## 2021-09-18 DIAGNOSIS — Z6838 Body mass index (BMI) 38.0-38.9, adult: Secondary | ICD-10-CM

## 2021-09-18 MED ORDER — METFORMIN HCL 1000 MG PO TABS: 1000.0000 mg | ORAL_TABLET | Freq: Two times a day (BID) | ORAL | 2 refills | Status: DC

## 2021-09-18 NOTE — Patient Instructions (Signed)
Your induction is on Sunday, May 28th  The hospital will call you starting at 6 in the morning and you have one hour to arrive at the hospital for your induction

## 2021-09-18 NOTE — Progress Notes (Signed)
    PRENATAL VISIT NOTE  Subjective:  Tami Crawford is a 36 y.o. G3P2002 at 69w3dbeing seen today for ongoing prenatal care.  She is currently monitored for the following issues for this high-risk pregnancy and has History of gestational hypertension; Supervision of high risk pregnancy, antepartum; T2DM (type 2 diabetes mellitus) ([place]; Pre-existing type 2 diabetes mellitus during pregnancy, antepartum; Obesity in pregnancy; BMI 38.0-38.9,adult; and Language barrier on their problem list.  Patient reports no complaints.  Contractions: Irritability. Vag. Bleeding: None.  Movement: Present. Denies leaking of fluid.   The following portions of the patient's history were reviewed and updated as appropriate: allergies, current medications, past family history, past medical history, past social history, past surgical history and problem list.   Objective:   Vitals:   09/18/21 1431  BP: 124/81  Pulse: 100  Weight: 247 lb (112 kg)    Fetal Status: Fetal Heart Rate (bpm): 157   Movement: Present  Presentation: Vertex  General:  Alert, oriented and cooperative. Patient is in no acute distress.  Skin: Skin is warm and dry. No rash noted.   Cardiovascular: Normal heart rate noted  Respiratory: Normal respiratory effort, no problems with respiration noted  Abdomen: Soft, gravid, appropriate for gestational age.  Pain/Pressure: Present     Pelvic: Cervical exam performed in the presence of a chaperone Dilation: 1 Effacement (%): Thick Station: Ballotable  Extremities: Normal range of motion.  Edema: Trace  Mental Status: Normal mood and affect. Normal behavior. Normal judgment and thought content.   Assessment and Plan:  Pregnancy: G3P2002 at [redacted]w[redacted]d. [redacted] weeks gestation of pregnancy - GC/Chlamydia probe amp ([place])[facility] Culture, beta strep (group b only)  2. Pre-existing type 2 diabetes mellitus during pregnancy, antepartum See media tab for CBG log. She is only on  metformin and was increased to 1000 bid at her last visit on 5/4; she states she did not increase to the new dose until yesterday. I told her that based on her CBGs that I don't feel that oral meds will get her CBGs to the normal range so I recommend IOL at 37-38wks (closer to 38wks given her CBGs are not terribly elevated). I d/w her re: before 39wk delivery with potential for need for extra care but I feel it's best given her situation. She is amenable to this and was set up for IOL in AM on 5/28 Continue with qwk testing 5/16 u/s: 87%, 3260gm, ac >99%, bpp 8/8 ceph, afi 9.6 Fetal echo wnl  3. Supervision of high risk pregnancy, antepartum  4. History of gestational hypertension Continue low dose asa  5. Obesity in pregnancy  6. BMI 38.0-38.9,adult  7. Language barrier Interpreter used  Preterm labor symptoms and general obstetric precautions including but not limited to vaginal bleeding, contractions, leaking of fluid and fetal movement were reviewed in detail with the patient. Please refer to After Visit Summary for other counseling recommendations.   No follow-ups on file.  Future Appointments  Date Time Provider De[place]5/25/2023  1:15 PM PrDonnamae JudeMD WMWolf [facility] Associates PaMKend[facility]5/25/2023  2:15 PM WMNew [facility] Surgical Center LLCST WMN[facility] HospitalMAdv[facility]5/28/2023  6:45 AM MC-LD SCHED ROOM MC-INDC None    ChAletha HalimMD

## 2021-09-19 LAB — GC/CHLAMYDIA PROBE AMP (~~LOC~~) NOT AT ARMC
Chlamydia: NEGATIVE
Comment: NEGATIVE
Comment: NORMAL
Neisseria Gonorrhea: NEGATIVE

## 2021-09-22 LAB — CULTURE, BETA STREP (GROUP B ONLY): Strep Gp B Culture: NEGATIVE

## 2021-09-24 ENCOUNTER — Telehealth (HOSPITAL_COMMUNITY): Payer: Self-pay | Admitting: *Deleted

## 2021-09-24 ENCOUNTER — Encounter (HOSPITAL_COMMUNITY): Payer: Self-pay

## 2021-09-24 NOTE — Telephone Encounter (Signed)
Preadmission screen  Interpreter number [phone]

## 2021-09-25 ENCOUNTER — Ambulatory Visit (INDEPENDENT_AMBULATORY_CARE_PROVIDER_SITE_OTHER): Payer: Medicaid Other | Admitting: Family Medicine

## 2021-09-25 ENCOUNTER — Ambulatory Visit (INDEPENDENT_AMBULATORY_CARE_PROVIDER_SITE_OTHER): Payer: Medicaid Other

## 2021-09-25 ENCOUNTER — Ambulatory Visit: Payer: Medicaid Other | Admitting: *Deleted

## 2021-09-25 ENCOUNTER — Other Ambulatory Visit: Payer: Self-pay | Admitting: Advanced Practice Midwife

## 2021-09-25 VITALS — BP 125/86 | HR 101 | Wt 256.8 lb

## 2021-09-25 DIAGNOSIS — Z8759 Personal history of other complications of pregnancy, childbirth and the puerperium: Secondary | ICD-10-CM

## 2021-09-25 DIAGNOSIS — Z3A37 37 weeks gestation of pregnancy: Secondary | ICD-10-CM

## 2021-09-25 DIAGNOSIS — O0990 Supervision of high risk pregnancy, unspecified, unspecified trimester: Secondary | ICD-10-CM

## 2021-09-25 DIAGNOSIS — O24119 Pre-existing diabetes mellitus, type 2, in pregnancy, unspecified trimester: Secondary | ICD-10-CM | POA: Diagnosis not present

## 2021-09-25 DIAGNOSIS — O99210 Obesity complicating pregnancy, unspecified trimester: Secondary | ICD-10-CM

## 2021-09-25 NOTE — Progress Notes (Signed)
   PRENATAL VISIT NOTE  Subjective:  Tami Crawford is a 36 y.o. G3P2002 at 61w3dbeing seen today for ongoing prenatal care.  She is currently monitored for the following issues for this high-risk pregnancy and has History of gestational hypertension; Supervision of high risk pregnancy, antepartum; T2DM (type 2 diabetes mellitus) ([place]; Pre-existing type 2 diabetes mellitus during pregnancy, antepartum; Obesity in pregnancy; BMI 38.0-38.9,adult; and Language barrier on their problem list.  Patient reports no complaints.  Contractions: Irritability. Vag. Bleeding: None.  Movement: Present. Denies leaking of fluid.   The following portions of the patient's history were reviewed and updated as appropriate: allergies, current medications, past family history, past medical history, past social history, past surgical history and problem list.   Objective:   Vitals:   09/25/21 1320  BP: 125/86  Pulse: (!) 101  Weight: 256 lb 12.8 oz (116.5 kg)    Fetal Status: Fetal Heart Rate (bpm): 158 Fundal Height: 41 cm Movement: Present  Presentation: Vertex  General:  Alert, oriented and cooperative. Patient is in no acute distress.  Skin: Skin is warm and dry. No rash noted.   Cardiovascular: Normal heart rate noted  Respiratory: Normal respiratory effort, no problems with respiration noted  Abdomen: Soft, gravid, appropriate for gestational age.  Pain/Pressure: Present     Pelvic: Cervical exam deferred        Extremities: Normal range of motion.  Edema: Trace  Mental Status: Normal mood and affect. Normal behavior. Normal judgment and thought content.   Assessment and Plan:  Pregnancy: G3P2002 at 350w3d. Supervision of high risk pregnancy, antepartum Continue care.   2. Pre-existing type 2 diabetes mellitus during pregnancy, antepartum CBGs, 100 fasting Last pm--141 In testing Last growth 3260 87%--pelvis proven to 7 lb 13 oz IOL at 38 wks NST:  Baseline: 150 bpm, Variability: Good  {> 6 bpm), Accelerations: Reactive, and Decelerations: Absent  3. History of gestational hypertension BP is ok today  Preterm labor symptoms and general obstetric precautions including but not limited to vaginal bleeding, contractions, leaking of fluid and fetal movement were reviewed in detail with the patient. Please refer to After Visit Summary for other counseling recommendations.   No follow-ups on file.  Future Appointments  Date Time Provider DeSouth [place]5/28/2023  6:45 AM MC-LD SCHED ROOM MC-INDC None    TaDonnamae JudeMD

## 2021-09-28 ENCOUNTER — Encounter (HOSPITAL_COMMUNITY): Payer: Self-pay | Admitting: Obstetrics and Gynecology

## 2021-09-28 ENCOUNTER — Inpatient Hospital Stay (HOSPITAL_COMMUNITY)
Admission: AD | Admit: 2021-09-28 | Discharge: 2021-10-01 | DRG: 807 | Disposition: A | Discharge status: Home / Self Care | Payer: Medicaid Other | Attending: Family Medicine | Admitting: Family Medicine

## 2021-09-28 ENCOUNTER — Other Ambulatory Visit: Payer: Self-pay

## 2021-09-28 ENCOUNTER — Inpatient Hospital Stay (HOSPITAL_COMMUNITY): Payer: Medicaid Other

## 2021-09-28 ENCOUNTER — Inpatient Hospital Stay (HOSPITAL_COMMUNITY): Admission: AD | Admit: 2021-09-28 | Payer: Medicaid Other | Source: Home / Self Care | Admitting: Family Medicine

## 2021-09-28 DIAGNOSIS — Z3A37 37 weeks gestation of pregnancy: Secondary | ICD-10-CM | POA: Diagnosis not present

## 2021-09-28 DIAGNOSIS — O139 Gestational [pregnancy-induced] hypertension without significant proteinuria, unspecified trimester: Secondary | ICD-10-CM | POA: Diagnosis not present

## 2021-09-28 DIAGNOSIS — Z7982 Long term (current) use of aspirin: Secondary | ICD-10-CM

## 2021-09-28 DIAGNOSIS — O134 Gestational [pregnancy-induced] hypertension without significant proteinuria, complicating childbirth: Secondary | ICD-10-CM | POA: Diagnosis present

## 2021-09-28 DIAGNOSIS — O24425 Gestational diabetes mellitus in childbirth, controlled by oral hypoglycemic drugs: Principal | ICD-10-CM | POA: Diagnosis present

## 2021-09-28 DIAGNOSIS — Z3A36 36 weeks gestation of pregnancy: Secondary | ICD-10-CM

## 2021-09-28 DIAGNOSIS — E119 Type 2 diabetes mellitus without complications: Secondary | ICD-10-CM

## 2021-09-28 DIAGNOSIS — O24419 Gestational diabetes mellitus in pregnancy, unspecified control: Secondary | ICD-10-CM

## 2021-09-28 DIAGNOSIS — O165 Unspecified maternal hypertension, complicating the puerperium: Secondary | ICD-10-CM

## 2021-09-28 DIAGNOSIS — O0990 Supervision of high risk pregnancy, unspecified, unspecified trimester: Principal | ICD-10-CM

## 2021-09-28 LAB — TYPE AND SCREEN
ABO/RH(D): O POS
Antibody Screen: NEGATIVE

## 2021-09-28 LAB — CBC
HCT: 33.9 % — ABNORMAL LOW (ref 36.0–46.0)
Hemoglobin: 11 g/dL — ABNORMAL LOW (ref 12.0–15.0)
MCH: 27.6 pg (ref 26.0–34.0)
MCHC: 32.4 g/dL (ref 30.0–36.0)
MCV: 85 fL (ref 80.0–100.0)
Platelets: 133 10*3/uL — ABNORMAL LOW (ref 150–400)
RBC: 3.99 MIL/uL (ref 3.87–5.11)
RDW: 14.6 % (ref 11.5–15.5)
WBC: 6.2 10*3/uL (ref 4.0–10.5)
nRBC: 0 % (ref 0.0–0.2)

## 2021-09-28 LAB — GLUCOSE, CAPILLARY
Glucose-Capillary: 187 mg/dL — ABNORMAL HIGH (ref 70–99)
Glucose-Capillary: 78 mg/dL (ref 70–99)
Glucose-Capillary: 94 mg/dL (ref 70–99)

## 2021-09-28 MED ORDER — OXYTOCIN BOLUS FROM INFUSION
333.0000 mL | Freq: Once | INTRAVENOUS | Status: AC
  Administered 2021-09-29: 333 mL via INTRAVENOUS

## 2021-09-28 MED ORDER — FENTANYL CITRATE (PF) 100 MCG/2ML IJ SOLN: 50.0000 ug | INTRAMUSCULAR | Status: DC | PRN

## 2021-09-28 MED ORDER — LIDOCAINE HCL (PF) 1 % IJ SOLN: 30.0000 mL | INTRAMUSCULAR | Status: DC | PRN

## 2021-09-28 MED ORDER — SOD CITRATE-CITRIC ACID 500-334 MG/5ML PO SOLN: 30.0000 mL | ORAL | Status: DC | PRN

## 2021-09-28 MED ORDER — ONDANSETRON HCL 4 MG/2ML IJ SOLN
4.0000 mg | Freq: Four times a day (QID) | INTRAMUSCULAR | Status: DC | PRN
Start: 2021-09-28 — End: 2021-09-29
  Administered 2021-09-28 – 2021-09-29 (×2): 4 mg via INTRAVENOUS
  Filled 2021-09-28 (×2): qty 2

## 2021-09-28 MED ORDER — TERBUTALINE SULFATE 1 MG/ML IJ SOLN: 0.2500 mg | Freq: Once | INTRAMUSCULAR | Status: DC | PRN

## 2021-09-28 MED ORDER — LACTATED RINGERS IV SOLN: INTRAVENOUS | Status: DC

## 2021-09-28 MED ORDER — MISOPROSTOL 25 MCG QUARTER TABLET
25.0000 ug | ORAL_TABLET | ORAL | Status: DC | PRN
  Administered 2021-09-28 (×2): 25 ug via VAGINAL
  Filled 2021-09-28 (×3): qty 1

## 2021-09-28 MED ORDER — LACTATED RINGERS IV SOLN
500.0000 mL | INTRAVENOUS | Status: DC | PRN
  Administered 2021-09-29: 500 mL via INTRAVENOUS

## 2021-09-28 MED ORDER — OXYTOCIN-SODIUM CHLORIDE 30-0.9 UT/500ML-% IV SOLN
2.5000 [IU]/h | INTRAVENOUS | Status: DC
  Administered 2021-09-29: 2.5 [IU]/h via INTRAVENOUS
  Filled 2021-09-28: qty 500

## 2021-09-28 MED ORDER — ACETAMINOPHEN 325 MG PO TABS: 650.0000 mg | ORAL_TABLET | ORAL | Status: DC | PRN

## 2021-09-28 NOTE — H&P (Addendum)
Tami Crawford is a 36 y.o. G76P2002 female at 32w6dby 9wk u/s, presenting for IOL d/t A2DM.   Reports active fetal movement, contractions: none, vaginal bleeding: none, membranes: intact.  [facility]   Most recent u/s 5/16 at 36.1wk, EFW 3260g/87%/AC >99%, AFI 9.61cm  This pregnancy complicated by: AQ3/FHLdx @ 15wks, A1C 6.9  Prenatal History/Complications:  1st: term IOL for GHTN, 3rd degree lac, repaired in OR 171m later d/t breakdown 2nd: term uncomplicated SVB  Past Medical History: Past Medical History:  Diagnosis Date   Medical history non-contributory     Past Surgical History: Past Surgical History:  Procedure Laterality Date   PERINEAL LACERATION REPAIR N/A 04/28/2013   Procedure: SUTURE REPAIR PERINEAL LACERATION;  Surgeon: EvThurnell LoseMD;  [facility];  Service: Gynecology;  Laterality: N/A;    Obstetrical History: OB History     Gravida  3   Para  2   Term  2   Preterm  0   AB  0   Living  2      SAB  0   IAB  0   Ectopic  0   Multiple  0   Live Births  2           Social History: Social History   Socioeconomic History   Marital status: Married    Spouse name: Not on file   Number of children: Not on file   Years of education: Not on file   Highest education level: Not on file  Occupational History   Not on file  Tobacco Use   Smoking status: Never   Smokeless tobacco: Never  Vaping Use   Vaping Use: Never used  Substance and Sexual Activity   Alcohol use: No   Drug use: No   Sexual activity: Yes  Other Topics Concern   Not on file  Social History Narrative   Not on file   Social Determinants of Health   Financial Resource Strain: Not on file  Food Insecurity: No Food Insecurity   Worried About Running Out of Food in the Last Year: Never true   Ran Out of Food in the Last Year: Never true  Transportation Needs: No Transportation Needs   Lack of Transportation (Medical): No    Lack of Transportation (Non-Medical): No  Physical Activity: Not on file  Stress: Not on file  Social Connections: Not on file    Family History: Family History  Problem Relation Age of Onset   Asthma Neg Hx    Cancer Neg Hx    Diabetes Neg Hx    Hypertension Neg Hx    Heart disease Neg Hx    Stroke Neg Hx     Allergies: No Known Allergies  Medications Prior to Admission  Medication Sig Dispense Refill Last Dose   acetaminophen (TYLENOL) 325 MG tablet Take 650 mg by mouth every 6 (six) hours as needed.   09/28/2021   aspirin EC 81 MG tablet Take 1 tablet (81 mg total) by mouth daily. Swallow whole. 30 tablet 11 09/28/2021   metFORMIN (GLUCOPHAGE) 1000 MG tablet Take 1 tablet (1,000 mg total) by mouth 2 (two) times daily with a meal. 60 tablet 2 09/28/2021   Prenatal Vit-Fe Fumarate-FA (PRENATAL MULTIVITAMIN) TABS tablet Take 1 tablet by mouth daily at 12 noon.   09/28/2021   Accu-Chek Softclix Lancets lancets To check blood sugars 4 times a day, fasting and 2 hours after first bite  of breakfast, lunch and supper/dinner. 100 each 12    Blood Glucose Monitoring Suppl (ACCU-CHEK GUIDE) w/Device KIT See admin instructions.      Blood Pressure Monitoring (BLOOD PRESSURE KIT) DEVI 1 Device by Does not apply route once a week. 1 each 0    calcium carbonate (TUMS - DOSED IN MG ELEMENTAL CALCIUM) 500 MG chewable tablet Chew 1 tablet by mouth daily.      glucose blood (ACCU-CHEK GUIDE) test strip To check blood sugars 4 times a day, fasting and 2 hours after the first bite of breakfast, lunch and dinner. 100 each 12     Review of Systems  Pertinent pos/neg as indicated in HPI  Blood pressure 133/79, pulse (!) 110, temperature 98.6 F (37 C), temperature source Oral, resp. rate 16, height 5' 7.5" (1.715 m), weight 116.1 kg, last menstrual period 01/13/2021, unknown if currently breastfeeding. General appearance: alert, cooperative, and no distress Lungs: clear to auscultation  bilaterally Heart: regular rate and rhythm Abdomen: gravid, soft, non-tender Extremities: tr edema   Fetal monitoring: FHR: 160 bpm, variability: moderate,  Accelerations: Present,  decelerations:  Absent Uterine activity: irregular Dilation: Fingertip Effacement (%): Thick Station: Ballotable Exam by:: Mary Martinique Johnson, RNC-OB Presentation: cephalic confirmed by informal TA u/s   Prenatal labs: ABO, Rh: --/--/PENDING (05/28 1420) Antibody: PENDING (05/28 1420) Rubella: 15.10 (12/21 1127) RPR: Non Reactive (03/14 1131)  HBsAg: Negative (12/21 1127)  HIV: Non Reactive (03/14 1131)  GBS: Negative/-- (05/18 1643)  2hr GTT: A1C 6.9 @ 15wks  Prenatal Transfer Tool  Maternal Diabetes: Yes, A2/BDM dx @ 15wks Genetic Screening: Normal Maternal Ultrasounds/Referrals: Normal Fetal Ultrasounds or other Referrals:  None Maternal Substance Abuse:  No Significant Maternal Medications:  Meds include: Other: metformin Significant Maternal Lab Results: Group B Strep negative  Results for orders placed or performed during the hospital encounter of 09/28/21 (from the past 24 hour(s))  Type and screen   Collection Time: 09/28/21  2:20 PM  Result Value Ref Range   ABO/RH(D) PENDING    Antibody Screen PENDING    Sample Expiration      10/01/2021,2359 Performed at [facility], [place] [address], [place], [place] [address]   CBC   Collection Time: 09/28/21  2:30 PM  Result Value Ref Range   WBC 6.2 4.0 - 10.5 K/uL   RBC 3.99 3.87 - 5.11 MIL/uL   Hemoglobin 11.0 (L) 12.0 - 15.0 g/dL   HCT 33.9 (L) 36.0 - 46.0 %   MCV 85.0 80.0 - 100.0 fL   MCH 27.6 26.0 - 34.0 pg   MCHC 32.4 30.0 - 36.0 g/dL   RDW 14.6 11.5 - 15.5 %   Platelets 133 (L) 150 - 400 K/uL   nRBC 0.0 0.0 - 0.2 %  Glucose, capillary   Collection Time: 09/28/21  2:34 PM  Result Value Ref Range   Glucose-Capillary 187 (H) 70 - 99 mg/dL     Assessment:  76w6dSIUP  G3P2002  IOL d/t A2/BDM, EFW 87% @  36.1wk  Cat 1 FHR  GBS Negative/-- (05/18 1643)  Plan:  Admit to L&D  IV pain meds/epidural prn active labor  Cytotec, foley bulb when able  CBGs q 4hr (latent), q2hr (active)  Anticipate NSVB   Plans to breast & bottlefeed  Contraception: undecided, discussed  Circumcision: yes  KRoma SchanzCNM, WHNP-BC 09/28/2021, 3:10 PM

## 2021-09-28 NOTE — Progress Notes (Addendum)
Tami Crawford is a 36 y.o. G3P2002 at 14w6dby LMP admitted for induction of labor due to A2/B DM..Marland Kitchen Subjective: Pt feeling contractions, coping well, resting in bed.   Objective: BP (!) 149/91   Pulse 88   Temp 98.4 F (36.9 C) (Oral)   Resp 15   Ht 5' 7.5" (1.715 m)   Wt 116.1 kg   LMP 01/13/2021 (Approximate)   BMI 39.49 kg/m  I/O last 3 completed shifts: In: 31.2 [I.V.:31.2] Out: -  No intake/output data recorded.  FHT:  FHR: 135 bpm, variability: moderate,  accelerations:  Present,  decelerations:  Absent UC:   regular, every 2-3 minutes SVE (at 2015):   Dilation: 1 Effacement (%): 20 Station: BNVR IncExam by:: Mary JMartiniqueJohnson, RNC-OB  Labs: Lab Results  Component Value Date   WBC 6.2 09/28/2021   HGB 11.0 (L) 09/28/2021   HCT 33.9 (L) 09/28/2021   MCV 85.0 09/28/2021   PLT 133 (L) 09/28/2021    Assessment / Plan: Induction of labor due to gestational diabetes S/P Cytotec x 2  Labor: Progressing normally and discussed with pt option to place foley balloon now, or since she is contracting and uncomfortable, rechecking at 0015 when next Cytotec is due and placing foley balloon if still needed. Discussed how use of foley balloon with Cytotec has been shown to shorten the length of labor and reduce chance of cesarean.  Pt prefers to wait for next exam and is open to foley at that time.   Preeclampsia:   n/a. Single elevated BP at 1956, will watch.  Fetal Wellbeing:  Category I Pain Control:  Labor support without medications I/D:   GBS neg Anticipated MOD:  NSVD  LFatima Blank5/28/2023, 10:15 PM

## 2021-09-29 ENCOUNTER — Inpatient Hospital Stay (HOSPITAL_COMMUNITY): Payer: Medicaid Other | Admitting: Anesthesiology

## 2021-09-29 ENCOUNTER — Encounter (HOSPITAL_COMMUNITY): Payer: Self-pay | Admitting: Obstetrics and Gynecology

## 2021-09-29 DIAGNOSIS — Z3A37 37 weeks gestation of pregnancy: Secondary | ICD-10-CM

## 2021-09-29 DIAGNOSIS — O134 Gestational [pregnancy-induced] hypertension without significant proteinuria, complicating childbirth: Secondary | ICD-10-CM

## 2021-09-29 DIAGNOSIS — O24425 Gestational diabetes mellitus in childbirth, controlled by oral hypoglycemic drugs: Secondary | ICD-10-CM

## 2021-09-29 DIAGNOSIS — O139 Gestational [pregnancy-induced] hypertension without significant proteinuria, unspecified trimester: Secondary | ICD-10-CM | POA: Diagnosis not present

## 2021-09-29 LAB — COMPREHENSIVE METABOLIC PANEL
ALT: 16 U/L (ref 0–44)
AST: 21 U/L (ref 15–41)
Albumin: 2.5 g/dL — ABNORMAL LOW (ref 3.5–5.0)
Alkaline Phosphatase: 94 U/L (ref 38–126)
Anion gap: 8 (ref 5–15)
BUN: <5 mg/dL — ABNORMAL LOW (ref 6–20)
CO2: 21 mmol/L — ABNORMAL LOW (ref 22–32)
Calcium: 8.5 mg/dL — ABNORMAL LOW (ref 8.9–10.3)
Chloride: 109 mmol/L (ref 98–111)
Creatinine, Ser: 0.58 mg/dL (ref 0.44–1.00)
GFR, Estimated: >60 mL/min (ref >60)
Glucose, Bld: 91 mg/dL (ref 70–99)
Potassium: 3.3 mmol/L — ABNORMAL LOW (ref 3.5–5.1)
Sodium: 138 mmol/L (ref 135–145)
Total Bilirubin: 0.6 mg/dL (ref 0.3–1.2)
Total Protein: 5.5 g/dL — ABNORMAL LOW (ref 6.5–8.1)

## 2021-09-29 LAB — GLUCOSE, CAPILLARY
Glucose-Capillary: 92 mg/dL (ref 70–99)
Glucose-Capillary: 93 mg/dL (ref 70–99)
Glucose-Capillary: 133 mg/dL — ABNORMAL HIGH (ref 70–99)
Glucose-Capillary: 76 mg/dL (ref 70–99)
Glucose-Capillary: 109 mg/dL — ABNORMAL HIGH (ref 70–99)
Glucose-Capillary: 98 mg/dL (ref 70–99)
Glucose-Capillary: 141 mg/dL — ABNORMAL HIGH (ref 70–99)

## 2021-09-29 LAB — RPR: RPR Ser Ql: NONREACTIVE

## 2021-09-29 MED ORDER — ONDANSETRON HCL 4 MG/2ML IJ SOLN: 4.0000 mg | INTRAMUSCULAR | Status: DC | PRN

## 2021-09-29 MED ORDER — FENTANYL CITRATE (PF) 100 MCG/2ML IJ SOLN
100.0000 ug | INTRAMUSCULAR | Status: DC | PRN
  Administered 2021-09-29 (×6): 100 ug via INTRAVENOUS
  Filled 2021-09-29 (×6): qty 2

## 2021-09-29 MED ORDER — TETANUS-DIPHTH-ACELL PERTUSSIS 5-2.5-18.5 LF-MCG/0.5 IM SUSY: 0.5000 mL | PREFILLED_SYRINGE | Freq: Once | INTRAMUSCULAR | Status: DC

## 2021-09-29 MED ORDER — ZOLPIDEM TARTRATE 5 MG PO TABS: 5.0000 mg | ORAL_TABLET | Freq: Every evening | ORAL | Status: DC | PRN

## 2021-09-29 MED ORDER — ONDANSETRON HCL 4 MG PO TABS: 4.0000 mg | ORAL_TABLET | ORAL | Status: DC | PRN

## 2021-09-29 MED ORDER — EPHEDRINE 5 MG/ML INJ: 10.0000 mg | INTRAVENOUS | Status: DC | PRN

## 2021-09-29 MED ORDER — PRENATAL MULTIVITAMIN CH
1.0000 | ORAL_TABLET | Freq: Every day | ORAL | Status: DC
  Administered 2021-09-30 – 2021-10-01 (×2): 1 via ORAL
  Filled 2021-09-29 (×2): qty 1

## 2021-09-29 MED ORDER — OXYCODONE HCL 5 MG PO TABS
10.0000 mg | ORAL_TABLET | ORAL | Status: DC | PRN
  Administered 2021-09-30: 10 mg via ORAL
  Filled 2021-09-29: qty 2

## 2021-09-29 MED ORDER — PHENYLEPHRINE 80 MCG/ML (10ML) SYRINGE FOR IV PUSH (FOR BLOOD PRESSURE SUPPORT): 80.0000 ug | PREFILLED_SYRINGE | INTRAVENOUS | Status: DC | PRN

## 2021-09-29 MED ORDER — WITCH HAZEL-GLYCERIN EX PADS: 1.0000 "application " | MEDICATED_PAD | CUTANEOUS | Status: DC | PRN

## 2021-09-29 MED ORDER — LIDOCAINE HCL (PF) 1 % IJ SOLN
INTRAMUSCULAR | Status: DC | PRN
  Administered 2021-09-29: 6 mL via EPIDURAL
  Administered 2021-09-29: 4 mL via EPIDURAL

## 2021-09-29 MED ORDER — ACETAMINOPHEN 325 MG PO TABS
650.0000 mg | ORAL_TABLET | ORAL | Status: DC | PRN
  Administered 2021-09-30 – 2021-10-01 (×3): 650 mg via ORAL
  Filled 2021-09-29 (×3): qty 2

## 2021-09-29 MED ORDER — OXYCODONE HCL 5 MG PO TABS: 5.0000 mg | ORAL_TABLET | ORAL | Status: DC | PRN

## 2021-09-29 MED ORDER — DIBUCAINE (PERIANAL) 1 % EX OINT: 1.0000 "application " | TOPICAL_OINTMENT | CUTANEOUS | Status: DC | PRN

## 2021-09-29 MED ORDER — OXYTOCIN-SODIUM CHLORIDE 30-0.9 UT/500ML-% IV SOLN
1.0000 m[IU]/min | INTRAVENOUS | Status: DC
  Administered 2021-09-29: 2 m[IU]/min via INTRAVENOUS
  Filled 2021-09-29: qty 500

## 2021-09-29 MED ORDER — DIPHENHYDRAMINE HCL 50 MG/ML IJ SOLN: 12.5000 mg | INTRAMUSCULAR | Status: DC | PRN

## 2021-09-29 MED ORDER — FUROSEMIDE 20 MG PO TABS
20.0000 mg | ORAL_TABLET | Freq: Every day | ORAL | Status: DC
  Administered 2021-09-30 – 2021-10-01 (×2): 20 mg via ORAL
  Filled 2021-09-29 (×2): qty 1

## 2021-09-29 MED ORDER — DIPHENHYDRAMINE HCL 25 MG PO CAPS: 25.0000 mg | ORAL_CAPSULE | Freq: Four times a day (QID) | ORAL | Status: DC | PRN

## 2021-09-29 MED ORDER — SENNOSIDES-DOCUSATE SODIUM 8.6-50 MG PO TABS
2.0000 | ORAL_TABLET | Freq: Every day | ORAL | Status: DC
Start: 2021-09-30 — End: 2021-10-01
  Administered 2021-09-30 – 2021-10-01 (×2): 2 via ORAL
  Filled 2021-09-29 (×2): qty 2

## 2021-09-29 MED ORDER — SIMETHICONE 80 MG PO CHEW
80.0000 mg | CHEWABLE_TABLET | ORAL | Status: DC | PRN
  Administered 2021-09-30: 80 mg via ORAL
  Filled 2021-09-29: qty 1

## 2021-09-29 MED ORDER — BENZOCAINE-MENTHOL 20-0.5 % EX AERO
1.0000 "application " | INHALATION_SPRAY | CUTANEOUS | Status: DC | PRN
  Administered 2021-09-30: 1 via TOPICAL
  Filled 2021-09-29: qty 56

## 2021-09-29 MED ORDER — LACTATED RINGERS IV SOLN
500.0000 mL | Freq: Once | INTRAVENOUS | Status: AC
  Administered 2021-09-29: 500 mL via INTRAVENOUS

## 2021-09-29 MED ORDER — IBUPROFEN 600 MG PO TABS
600.0000 mg | ORAL_TABLET | Freq: Four times a day (QID) | ORAL | Status: DC
Start: 2021-09-30 — End: 2021-10-01
  Administered 2021-09-30 – 2021-10-01 (×7): 600 mg via ORAL
  Filled 2021-09-29 (×7): qty 1

## 2021-09-29 MED ORDER — COCONUT OIL OIL: 1.0000 "application " | TOPICAL_OIL | Status: DC | PRN

## 2021-09-29 MED ORDER — FENTANYL-BUPIVACAINE-NACL 0.5-0.125-0.9 MG/250ML-% EP SOLN
12.0000 mL/h | EPIDURAL | Status: DC | PRN
  Administered 2021-09-29: 12 mL/h via EPIDURAL
  Filled 2021-09-29: qty 250

## 2021-09-29 MED ORDER — TERBUTALINE SULFATE 1 MG/ML IJ SOLN: 0.2500 mg | Freq: Once | INTRAMUSCULAR | Status: DC | PRN

## 2021-09-29 NOTE — Progress Notes (Signed)
Tami Crawford is a 36 y.o. G3P2002 at 20w0dadmitted for induction of labor due to Gestational diabetes.  Subjective: Pt feeling stronger contractions, but pain improved since foley balloon came out.  She is coping well with IV pain medication.  Objective: BP 115/65   Pulse 74   Temp 98.7 F (37.1 C) (Oral)   Resp 18   Ht 5' 7.5" (1.715 m)   Wt 116.1 kg   LMP 01/13/2021 (Approximate)   BMI 39.49 kg/m  I/O last 3 completed shifts: In: 31.2 [I.V.:31.2] Out: -  No intake/output data recorded.  FHT:  FHR: 135 bpm, variability: moderate,  accelerations:  Present,  decelerations:  Absent UC:   irregular, every 1-5 minutes SVE:   Dilation: 5 Effacement (%): 50 Station: -3 Exam by:: LLattie Haw CNM  Labs: Lab Results  Component Value Date   WBC 6.2 09/28/2021   HGB 11.0 (L) 09/28/2021   HCT 33.9 (L) 09/28/2021   MCV 85.0 09/28/2021   PLT 133 (L) 09/28/2021    Assessment / Plan: Induction of labor due to diabetes S/P Cytotec x 3 S/P Foley balloon, out at 0340  Labor: Progressing normally and Discussed options with pt including AROM, Pitocin, or expectant management. Exam very difficult for pt with posterior cervix so pt declines AROM at this time.  Pt agrees to Pitocin per protocol for stronger, more regular contractions.  Preeclampsia:   n/a Fetal Wellbeing:  Category I Pain Control:  IV pain meds I/D:   GBS neg Anticipated MOD:  NSVD  LFatima Blank5/29/2023, 4:57 AM

## 2021-09-29 NOTE — Progress Notes (Signed)
Patient Vitals for the past 4 hrs:  BP Temp Temp src Pulse Resp  09/29/21 1831 127/78 -- -- 69 20  09/29/21 1801 129/87 98.1 F (36.7 C) Oral 86 18  09/29/21 1731 (!) 144/88 -- -- 71 (!) 28  09/29/21 1727 136/87 -- -- 89 --  09/29/21 1721 (!) 145/80 -- -- 71 18  09/29/21 1716 133/80 -- -- 67 18  09/29/21 1711 132/88 -- -- 79 18  09/29/21 1706 130/85 -- -- 86 18  09/29/21 1701 (!) 134/97 -- -- 95 (!) 28  09/29/21 1656 138/81 -- -- 90 (!) 28  09/29/21 1649 (!) 152/100 -- -- 92 (!) 28   Cx now C/C/0 station.  FHR Cat 1.  Slow but steady progress.

## 2021-09-29 NOTE — Discharge Summary (Signed)
Postpartum Discharge Summary  Date of Service updated***     Patient Name: Tami Crawford DOB: 1986-02-18 MRN: [id number]  Date of admission: 09/28/2021 Delivery date:09/29/2021  Delivering provider: Radene Gunning  Date of discharge: 09/29/2021  Admitting diagnosis: GDM, class A2 [O24.419] Intrauterine pregnancy: [redacted]w[redacted]d    Secondary diagnosis:  Principal Problem:   Vaginal delivery Active Problems:   GDM, class A2   Gestational hypertension  Additional problems: ***    Discharge diagnosis: Term Pregnancy Delivered, Gestational Hypertension, GDM A2, and Type 2 DM                                              Post partum procedures:{Postpartum procedures:23558} Augmentation: AROM, Pitocin, Cytotec, and IP Foley Complications: None  Hospital course: Induction of Labor With Vaginal Delivery   36y.o. yo G3P2002 at 385w0das admitted to the hospital 09/28/2021 for induction of labor.  Indication for induction:  A2DM vs T2DM .   Patient had an uncomplicated labor course as follows: She was started on cytotec until a foley bulb could be placed. Once the foley bulb was out she was started on Pitocin. Once the head came down, AROM was performed. She then progressed slowly to completely dilated and felt pressure and and urge to push.   Membrane Rupture Time/Date: 2:27 PM ,09/29/2021   Delivery Method:Vaginal, Spontaneous  Episiotomy: None  Lacerations:   1st degree  Details of delivery can be found in separate delivery note.  Patient had a routine postpartum course. In terms of her CBG on PPD 1 it was ***.  Her blood pressures during her admission were ***.  She was started on lasix on PPD1. Patient is discharged home 09/29/21.  Newborn Data: Birth date:09/29/2021  Birth time:9:55 PM  Gender:Female  Living status:Living  Apgars: ,  Weight:   Magnesium Sulfate received: No BMZ received: No Rhophylac:N/A MMR:N/A T-DaP:Given prenatally Flu: N/A Transfusion:{Transfusion  received:30440034}  Physical exam  Vitals:   09/29/21 2000 09/29/21 2030 09/29/21 2100 09/29/21 2215  BP: 135/82 122/81 (!) 132/95 118/76  Pulse: 79 77 95 95  Resp: '18 18 18 18  ' Temp:      TempSrc:      Weight:      Height:       General: {Exam; general:21111117} Lochia: {Desc; appropriate/inappropriate:30686::"appropriate"} Uterine Fundus: {Desc; firm/soft:30687} Incision: {Exam; incision:21111123} DVT Evaluation: {Exam; dvt:2111122} Labs: Lab Results  Component Value Date   WBC 6.2 09/28/2021   HGB 11.0 (L) 09/28/2021   HCT 33.9 (L) 09/28/2021   MCV 85.0 09/28/2021   PLT 133 (L) 09/28/2021      Latest Ref Rng & Units 09/29/2021    7:56 PM  CMP  Glucose 70 - 99 mg/dL 91    BUN 6 - 20 mg/dL <5    Creatinine 0.44 - 1.00 mg/dL 0.58    Sodium 135 - 145 mmol/L 138    Potassium 3.5 - 5.1 mmol/L 3.3    Chloride 98 - 111 mmol/L 109    CO2 22 - 32 mmol/L 21    Calcium 8.9 - 10.3 mg/dL 8.5    Total Protein 6.5 - 8.1 g/dL 5.5    Total Bilirubin 0.3 - 1.2 mg/dL 0.6    Alkaline Phos 38 - 126 U/L 94    AST 15 - 41 U/L 21    ALT 0 - 44  U/L 16     Edinburgh Score:     View : No data to display.           After visit meds:  Allergies as of 09/29/2021   No Known Allergies   Med Rec must be completed prior to using this [facility]***        Discharge home in stable condition Infant Feeding: Bottle and Breast Infant Disposition:{CHL IP OB HOME WITH KSYSDB:33448} Discharge instruction: per After Visit Summary and Postpartum booklet. Activity: Advance as tolerated. Pelvic rest for 6 weeks.  Diet: carb modified diet and low salt diet Future Appointments:No future appointments. Follow up Visit:  [place] [facility] Follow up in 1 week(s).   Specialty: Obstetrics and Gynecology Why: For blood pressure check Contact information: [place]  [address] [phone]               Message sent 5/29 to [facility] for appointment by Dr. Damita Dunnings.   Please schedule this patient for a In person postpartum visit in 4 weeks with the following provider: Any provider. She will also be scheduled for a 1 week blood pressure check.  Additional Postpartum F/U:2 hour GTT  Low risk pregnancy complicated by: GDM Delivery mode:  Vaginal, Spontaneous  Anticipated Birth Control:  {Birth Control:23956}   09/29/2021 Radene Gunning, MD

## 2021-09-29 NOTE — Progress Notes (Signed)
Tami Crawford is a 36 y.o. G3P2002 at 41w0dadmitted for induction of labor due to A2/B DM.  Subjective: Pt feeling contractions, coping well.    Objective: BP 140/68   Pulse 79   Temp 98.6 F (37 C) (Oral)   Resp 16   Ht 5' 7.5" (1.715 m)   Wt 116.1 kg   LMP 01/13/2021 (Approximate)   BMI 39.49 kg/m  I/O last 3 completed shifts: In: 31.2 [I.V.:31.2] Out: -  No intake/output data recorded.  FHT:  FHR: 135 bpm, variability: moderate,  accelerations:  Present,  decelerations:  Absent UC:   regular, every 1-2 minutes SVE:   Dilation: 1 Effacement (%): 20 Station: Ballotable Exam by:: Tami Crawford, CNM Foley balloon placed with some difficulty due to posterior cervix, filled to 60 ml by RN.  Pt tolerated well.   Labs: Lab Results  Component Value Date   WBC 6.2 09/28/2021   HGB 11.0 (L) 09/28/2021   HCT 33.9 (L) 09/28/2021   MCV 85.0 09/28/2021   PLT 133 (L) 09/28/2021    Assessment / Plan: Induction of labor due to diabetes  Labor: Progressing normally Preeclampsia:   n/a Fetal Wellbeing:  Category I Pain Control:  Labor support without medications I/D:   GBS neg Anticipated MOD:  NSVD  Tami Crawford 09/29/2021, 1:29 AM

## 2021-09-29 NOTE — Anesthesia Procedure Notes (Signed)
Epidural Patient location during procedure: OB Start time: 09/29/2021 4:42 PM End time: 09/29/2021 4:55 PM  Staffing Anesthesiologist: Lidia Collum, MD Performed: anesthesiologist   Preanesthetic Checklist Completed: patient identified, IV checked, risks and benefits discussed, monitors and equipment checked, pre-op evaluation and timeout performed  Epidural Patient position: sitting Prep: DuraPrep Patient monitoring: heart rate, continuous pulse ox and blood pressure Approach: midline Location: L3-L4 Injection technique: LOR air  Needle:  Needle type: Tuohy  Needle gauge: 17 G Needle length: 9 cm Needle insertion depth: 7 cm Catheter type: closed end flexible Catheter size: 19 Gauge Catheter at skin depth: 12 cm Test dose: negative  Assessment Events: blood not aspirated, injection not painful, no injection resistance, no paresthesia and negative IV test  Additional Notes Reason for block:procedure for pain

## 2021-09-29 NOTE — Progress Notes (Signed)
Patient Vitals for the past 4 hrs:  BP Pulse Resp  09/29/21 1801 129/87 86 --  09/29/21 1731 (!) 144/88 71 (!) 28  09/29/21 1727 136/87 89 --  09/29/21 1721 (!) 145/80 71 18  09/29/21 1716 133/80 67 18  09/29/21 1711 132/88 79 18  09/29/21 1706 130/85 86 18  09/29/21 1701 (!) 134/97 95 (!) 28  09/29/21 1656 138/81 90 (!) 28  09/29/21 1649 (!) 152/100 92 (!) 28  09/29/21 1410 116/64 74 12   Now meets criteria for GHTN.  Comfortable w/epidural.  Cx is now 9/100/-2 station.  Repositioned to help facilitate decent.  FHR Cat 1, some early decels.  Ctx q 2 minutes.

## 2021-09-29 NOTE — Progress Notes (Signed)
Patient Vitals for the past 4 hrs:  BP Temp Temp src Pulse Resp  09/29/21 0953 (!) 142/76 -- -- 92 --  09/29/21 0901 135/84 -- -- 81 16  09/29/21 0823 122/69 -- -- 73 --  09/29/21 0723 (!) 147/77 98.4 F (36.9 C) Oral 74 16  09/29/21 0701 123/70 -- -- 79 --  09/29/21 0630 115/77 -- -- 79 --   Ctx are mild, q 2 minutes  FHR Cat 1. Pitocin at 10 mu/min.  Cx 5/thick/ballottable. Too high to comfortably AROM.  Will increase pitocin to try to improve strength of ctx.

## 2021-09-29 NOTE — Progress Notes (Signed)
Tami Crawford is a 36 y.o. G3P2002 at 80w0dby LMP admitted for induction of labor due to A2DM.  Subjective: Pt feeling intermittent pelvic pressure with contractions.  S/O in room for support.  Objective: BP 122/81   Pulse 77   Temp 98.6 F (37 C) (Oral)   Resp 18   Ht 5' 7.5" (1.715 m)   Wt 116.1 kg   LMP 01/13/2021 (Approximate)   BMI 39.49 kg/m  I/O last 3 completed shifts: In: 31.2 [I.V.:31.2] Out: -  No intake/output data recorded.  FHT:  FHR: 135 bpm, variability: moderate,  accelerations:  Present,  decelerations:  Present occasional variables UC:   regular, every 2 minutes SVE at 1930:  Dilation: 10 Effacement (%): 80, 90 Station: 0 Exam by:: FMylo RedDishmon  Labs: Lab Results  Component Value Date   WBC 6.2 09/28/2021   HGB 11.0 (L) 09/28/2021   HCT 33.9 (L) 09/28/2021   MCV 85.0 09/28/2021   PLT 133 (L) 09/28/2021    Assessment / Plan: Induction of labor due to gestational diabetes,  progressing well on pitocin  Labor: Progressing normally and discussed option of pushing now vs laboring down.  Plan to labor down for 326m to 1 hour or until pressure is more uncomfortable or FHR tracing changes.   Preeclampsia:  labs stable Fetal Wellbeing:  Category II Pain Control:  Epidural I/D:   GBS neg Anticipated MOD:  NSVD  LiFatima Blank/29/2023, 8:53 PM

## 2021-09-29 NOTE — Anesthesia Preprocedure Evaluation (Signed)
Anesthesia Evaluation  Patient identified by MRN, date of birth, ID band Patient awake    Reviewed: Allergy & Precautions, H&P , NPO status , Patient's Chart, lab work & pertinent test results  History of Anesthesia Complications Negative for: history of anesthetic complications  Airway Mallampati: II  TM Distance: >3 FB     Dental   Pulmonary neg pulmonary ROS,    Pulmonary exam normal        Cardiovascular negative cardio ROS   Rhythm:regular Rate:Normal     Neuro/Psych negative neurological ROS  negative psych ROS   GI/Hepatic negative GI ROS, Neg liver ROS,   Endo/Other  diabetes, Type 2Morbid obesity  Renal/GU      Musculoskeletal   Abdominal   Peds  Hematology negative hematology ROS (+)   Anesthesia Other Findings   Reproductive/Obstetrics (+) Pregnancy                             Anesthesia Physical Anesthesia Plan  ASA: 3  Anesthesia Plan: Epidural   Post-op Pain Management:    Induction:   PONV Risk Score and Plan:   Airway Management Planned:   Additional Equipment:   Intra-op Plan:   Post-operative Plan:   Informed Consent: I have reviewed the patients History and Physical, chart, labs and discussed the procedure including the risks, benefits and alternatives for the proposed anesthesia with the patient or authorized representative who has indicated his/her understanding and acceptance.       Plan Discussed with:   Anesthesia Plan Comments:         Anesthesia Quick Evaluation

## 2021-09-29 NOTE — Progress Notes (Signed)
Patient Vitals for the past 4 hrs:  BP Temp Temp src Pulse Resp  09/29/21 1410 116/64 -- -- 74 12  09/29/21 1330 -- 98.8 F (37.1 C) Oral -- --  09/29/21 1259 127/76 -- -- 74 12  09/29/21 1234 -- -- -- -- 16  09/29/21 1213 128/71 -- -- 89 16  09/29/21 1131 128/69 -- -- 72 16  09/29/21 1038 (!) 149/85 -- -- 87 16   Ctx are still very mild. Pitocin at 23m/min.  FHR Cat 1.  No change in cx.  AROM by Dr. PKennon Rounds  Continue to monitor.

## 2021-09-29 NOTE — Progress Notes (Signed)
Patient Vitals for the past 4 hrs:  BP Pulse Resp  09/29/21 1213 128/71 89 16  09/29/21 1131 128/69 72 16  09/29/21 1038 (!) 149/85 87 16  09/29/21 0953 (!) 142/76 92 --  09/29/21 0901 135/84 81 16   Sleeping some. Requested IV pain med prior to vaginal exam. FHR Cat 1.  Ctx not tracing well. FHR Cat 1.  Pitocin at 16 mu/min. No change in cx, still very thick.  Continue to increase pitocin/ consider cytotec if no further change and unable to AROM in a few hours.

## 2021-09-30 DIAGNOSIS — O24419 Gestational diabetes mellitus in pregnancy, unspecified control: Secondary | ICD-10-CM

## 2021-09-30 DIAGNOSIS — O139 Gestational [pregnancy-induced] hypertension without significant proteinuria, unspecified trimester: Secondary | ICD-10-CM

## 2021-09-30 LAB — GLUCOSE, CAPILLARY: Glucose-Capillary: 93 mg/dL (ref 70–99)

## 2021-09-30 NOTE — Progress Notes (Signed)
Interpreter # [phone] used for morning assessment, infant assessment, and ordering patient's breakfast

## 2021-09-30 NOTE — Lactation Note (Signed)
Lactation Consultation Note Mom declines Lactation services.  Patient Name: Tami Crawford [id number] Date: 09/30/2021   Age:36 y.o.  Maternal Data    Feeding    LATCH Score                    Lactation Tools Discussed/Used    Interventions    Discharge    Consult Status Consult Status: Complete    Tami Crawford 09/30/2021, 1:34 AM

## 2021-09-30 NOTE — Anesthesia Postprocedure Evaluation (Signed)
Anesthesia Post Note  Patient: Tami Crawford  Procedure(s) Performed: AN AD HOC LABOR EPIDURAL     Patient location during evaluation: Mother Baby Anesthesia Type: Epidural Level of consciousness: awake, awake and alert and oriented Pain management: pain level controlled Vital Signs Assessment: post-procedure vital signs reviewed and stable Respiratory status: spontaneous breathing, nonlabored ventilation and respiratory function stable Cardiovascular status: stable Postop Assessment: no headache, patient able to bend at knees, no apparent nausea or vomiting, adequate PO intake and able to ambulate Anesthetic complications: no   No notable events documented.  Last Vitals:  Vitals:   09/30/21 0048 09/30/21 0500  BP: 123/68 129/71  Pulse: 92 86  Resp: 18 18  Temp: 37.2 C 37.1 C  SpO2: 99% 100%    Last Pain:  Vitals:   09/30/21 0632  TempSrc:   PainSc: Asleep   Pain Goal:                   Faylene Allerton

## 2021-09-30 NOTE — Lactation Note (Signed)
This note was copied from a baby's chart. Lactation Consultation Note Mom declines Lactation services.  Patient Name: Tami Crawford [id number] Date: 09/30/2021   Age:34 hours  Maternal Data    Feeding    LATCH Score                    Lactation Tools Discussed/Used    Interventions    Discharge    Consult Status Consult Status: Complete    Tami Crawford G 09/30/2021, 1:35 AM

## 2021-09-30 NOTE — Progress Notes (Addendum)
POSTPARTUM PROGRESS NOTE  Post Partum Day one  Subjective:  Tami Crawford is a 36 y.o. [id number] s/p vaginal delivery at [redacted]w[redacted]d  No acute events overnight.  Pt denies problems with ambulating, voiding or po intake.  She denies nausea or vomiting.  Pain is moderately controlled.  Lochia Moderate. She describes it as heavier than a normal period, no clots.   Objective: Blood pressure 129/79, pulse 89, temperature 97.7 F (36.5 C), temperature source Oral, resp. rate 18, height 5' 7.5" (1.715 m), weight 116.1 kg, last menstrual period 01/13/2021, SpO2 100 %, unknown if currently breastfeeding.  Physical Exam:  General: alert, cooperative and no distress Chest: no respiratory distress Heart: regular rate Abdomen: soft, nontender,  Uterine Fundus: firm, appropriately tender DVT Evaluation: No calf swelling or tenderness Extremities: trace lower leg edema, nonpitting.  Skin: warm, dry; Recent Labs    09/28/21 1430  HGB 11.0*  HCT 33.9*   FCBG: 93 Assessment/Plan: Tami Slatteryis a 36y.o. GD9I3382s/p vaginal delivery at 3100w0d PPD#1 - Doing well Contraception: POP Feeding: breast and bottle Dispo: Plan for discharge tomorrow. GHTN: stable, on lasix A2/BDM: stable, no meds  LOS: 2 days   Tami Crawford, Tami ShutterCNM 09/30/2021, 11:04 AM    Midwife attestation I have seen and examined this patient and agree with above documentation in the student's note.   Post Partum Day 1  Tami Crawford a [age].o. [id number]/p SVD.  Pt denies problems with ambulating, voiding or po intake. Pain is well controlled. Method of Feeding: breast and formula  PE:  Gen: well appearing Heart: reg rate Lungs: normal WOB Fundus firm Ext: soft, no pain, no edema  Assessment: S/p SVD PPD #1 gHTN A2/B DM   Plan for discharge: tomorrow POPs for contraception  MeJulianne HandlerCNM 11:17 AM

## 2021-10-01 ENCOUNTER — Other Ambulatory Visit (HOSPITAL_COMMUNITY): Payer: Self-pay

## 2021-10-01 MED ORDER — IBUPROFEN 600 MG PO TABS
600.0000 mg | ORAL_TABLET | Freq: Four times a day (QID) | ORAL | 0 refills | Status: DC
Start: 2021-10-01 — End: 2022-04-18
  Filled 2021-10-01: qty 30, 8d supply, fill #0

## 2021-10-01 MED ORDER — FUROSEMIDE 20 MG PO TABS
20.0000 mg | ORAL_TABLET | Freq: Every day | ORAL | 0 refills | Status: DC
  Filled 2021-10-01: qty 5, 5d supply, fill #0

## 2021-10-01 NOTE — Progress Notes (Signed)
Patient and husband both decline an interpreter this morning. Breakfast ordered. Timoteo Ace, RN

## 2021-10-02 ENCOUNTER — Other Ambulatory Visit: Payer: Medicaid Other

## 2021-10-06 ENCOUNTER — Ambulatory Visit (INDEPENDENT_AMBULATORY_CARE_PROVIDER_SITE_OTHER): Payer: Medicaid Other | Admitting: *Deleted

## 2021-10-06 VITALS — BP 135/89 | HR 70 | Ht 68.0 in | Wt 232.0 lb

## 2021-10-06 DIAGNOSIS — Z0130 Encounter for examination of blood pressure without abnormal findings: Secondary | ICD-10-CM

## 2021-10-06 NOTE — Progress Notes (Signed)
Here for nurse visit for blood pressure check s/p vaginal delivery 09/29/21 after IOL for GDM A2 vs Type 2 DM and GHTN. WAs d/c on Metformin. BP today 135/89. Reviewed signs/ symptoms pre-eclampsia, when to go to hospital. Reviewed patient postpartum appointment with her and that she is also scheduled for 2 hour GTT. Reviewed chart with Dr. Ilda Basset. Per Dr. Ilda Basset instructed patient to stop taking Metformin, continue checking blood glucose 4 times per day and if above 175 to call us as she may need to restart metformin before postpartum appointment. If blood sugars not above 175 will proceed with 2 hour glucose test at postpartum appointment to confirm whether or not she has Type 2 DM. She voices understandings and repeated back to nurse plan of care. Staci Acosta

## 2021-11-07 ENCOUNTER — Other Ambulatory Visit: Payer: Self-pay | Admitting: *Deleted

## 2021-11-07 DIAGNOSIS — O24429 Gestational diabetes mellitus in childbirth, unspecified control: Secondary | ICD-10-CM

## 2021-11-10 ENCOUNTER — Other Ambulatory Visit: Payer: Self-pay

## 2021-11-10 ENCOUNTER — Ambulatory Visit (INDEPENDENT_AMBULATORY_CARE_PROVIDER_SITE_OTHER): Payer: Medicaid Other | Admitting: Advanced Practice Midwife

## 2021-11-10 VITALS — BP 106/74 | HR 86 | Wt 217.0 lb

## 2021-11-10 DIAGNOSIS — O24415 Gestational diabetes mellitus in pregnancy, controlled by oral hypoglycemic drugs: Secondary | ICD-10-CM

## 2021-11-10 DIAGNOSIS — Z30011 Encounter for initial prescription of contraceptive pills: Secondary | ICD-10-CM

## 2021-11-10 DIAGNOSIS — N643 Galactorrhea not associated with childbirth: Secondary | ICD-10-CM

## 2021-11-10 MED ORDER — NORETHINDRONE 0.35 MG PO TABS: 1.0000 | ORAL_TABLET | Freq: Every day | ORAL | 12 refills | Status: DC

## 2021-11-10 NOTE — Progress Notes (Signed)
[place] Partum Visit Note  Tami Crawford is a 36 y.o. G38P3003 female who presents for a postpartum visit. She is 6 weeks postpartum following a normal spontaneous vaginal delivery.  I have fully reviewed the prenatal and intrapartum course. The delivery was at 70w0dgestational weeks.  Anesthesia: epidural. Postpartum course has been uncomplicated. Baby is doing well. Baby is feeding by both breast and bottle - Gerber Gentle  (mostly breast milk). Bleeding no bleeding. Bowel function is normal. Bladder function is normal. Patient is not sexually active. Contraception method is planning OCP (estrogen/progesterone). Postpartum depression screening: negative.  Reports breasts feel very full and sometime hurt. Baby is latching and feeding well. Is not away from baby for prolonged periods of time. Denies fever, redness, masses, bleeding. Wants to know if she should be pumping.    The pregnancy intention screening data noted above was reviewed. Potential methods of contraception were discussed. The patient elected to proceed with No data recorded.    Health Maintenance Due  Topic Date Due   COVID-19 Vaccine (1) Never done   FOOT EXAM  Never done   OPHTHALMOLOGY EXAM  Never done   URINE MICROALBUMIN  Never done   HEMOGLOBIN A1C  10/22/2021    The following portions of the patient's history were reviewed and updated as appropriate: allergies, current medications, past family history, past medical history, past social history, past surgical history, and problem list.  Review of Systems Pertinent items noted in HPI and remainder of comprehensive ROS otherwise negative.  Objective:  LMP 01/13/2021 (Approximate)    General:  alert, cooperative, appears stated age, no distress, and mildly obese   Breasts:  declined  Lungs: Nml rate and effort  Heart:  regular rate   Abdomen: normal findings: no organomegaly and soft, non-tender   Wound NA  GU exam:  not indicated       Assessment:    1. Oral contraception initiation  - norethindrone (MICRONOR) 0.35 MG tablet; Take 1 tablet (0.35 mg total) by mouth daily.  Dispense: 28 tablet; Refill: 12  2. Suspect Milk overproduction - Only recommend minimal pumping if baby can't latch well or if needed if separated from baby for more than 3 hours. Cautioned against pumping large amount as it may worsen overproduction.  - Ambulatory referral to Lactation  3. Postpartum care and examination - F/U Gyn exam in 1 year  4. Gestational diabetes mellitus (GDM) in third trimester controlled on oral hypoglycemic drug - 2 hour GTT scheduled   Plan:   Essential components of care per ACOG recommendations:  1.  Mood and well being: Patient with negative depression screening today. Reviewed local resources for support.  - Patient tobacco use? No.   - hx of drug use? No.    2. Infant care and feeding:  -Patient currently breastmilk feeding? Yes. Reviewed importance of draining breast regularly to support lactation.  -Social determinants of health (SDOH) reviewed in EPIC. No concerns. The following needs were identified None  3. Sexuality, contraception and birth spacing - Patient does not want a pregnancy in the next year.  Desired family size is unknown children.  - Reviewed reproductive life planning. Reviewed contraceptive methods based on pt preferences and effectiveness.  Patient desired Oral Contraceptive today.   - Discussed birth spacing of 18 months  4. Sleep and fatigue -Encouraged family/partner/community support of 4 hrs of uninterrupted sleep to help with mood and fatigue  5. Physical Recovery  - Discussed patients delivery and complications. She  describes her labor as good. - Patient had a Vaginal, no problems at delivery. Patient had a 1st degree laceration. Perineal healing reviewed. Patient expressed understanding - Patient has urinary incontinence? No. - Patient is safe to resume physical and sexual activity  6.   Health Maintenance - HM due items addressed Yes - Last pap smear  Diagnosis  Date Value Ref Range Status  04/23/2021   Final   - Negative for intraepithelial lesion or malignancy (NILM)   Pap smear not done at today's visit.  -Breast Cancer screening indicated? No.   7. Chronic Disease/Pregnancy Condition follow up: Gestational Diabetes  - PCP follow up  [place], [place] 11/10/2021 6:17 PM

## 2021-11-17 ENCOUNTER — Other Ambulatory Visit: Payer: Medicaid Other

## 2021-11-17 ENCOUNTER — Other Ambulatory Visit: Payer: Self-pay

## 2021-11-17 DIAGNOSIS — O24429 Gestational diabetes mellitus in childbirth, unspecified control: Secondary | ICD-10-CM

## 2021-11-18 ENCOUNTER — Encounter: Payer: Self-pay | Admitting: General Practice

## 2021-11-18 LAB — GLUCOSE TOLERANCE, 2 HOURS
Glucose, 2 hour: 139 mg/dL (ref 70–139)
Glucose, GTT - Fasting: 91 mg/dL (ref 70–99)

## 2021-11-18 NOTE — Progress Notes (Unsigned)
Patient came by office requesting test results from yesterday. Informed patient of normal results. Patient verbalized understanding.

## 2021-11-21 ENCOUNTER — Encounter: Payer: Self-pay | Admitting: Radiology

## 2022-02-11 ENCOUNTER — Other Ambulatory Visit: Payer: Self-pay

## 2022-02-11 ENCOUNTER — Ambulatory Visit (INDEPENDENT_AMBULATORY_CARE_PROVIDER_SITE_OTHER): Payer: Medicaid Other | Admitting: Obstetrics and Gynecology

## 2022-02-11 ENCOUNTER — Encounter: Payer: Self-pay | Admitting: Obstetrics and Gynecology

## 2022-02-11 VITALS — BP 100/77 | HR 80 | Ht 68.0 in | Wt 222.0 lb

## 2022-02-11 DIAGNOSIS — K5909 Other constipation: Secondary | ICD-10-CM | POA: Diagnosis not present

## 2022-02-11 DIAGNOSIS — N911 Secondary amenorrhea: Secondary | ICD-10-CM

## 2022-02-11 LAB — POCT PREGNANCY, URINE: Preg Test, Ur: NEGATIVE

## 2022-02-11 MED ORDER — POLYETHYLENE GLYCOL 3350 17 G PO PACK: 17.0000 g | PACK | Freq: Every day | ORAL | 0 refills | Status: AC | PRN

## 2022-02-11 NOTE — Progress Notes (Signed)
Subjective:  CC: amenorrhea    Patient ID: Tami Crawford, female    DOB: 1986/01/18, 36 y.o.   MRN: [id number]  She has not had had a menses since her delivery. Initially on the mini-pill and stopped about 3 weeks ago due to not having a menses and not sure if it was due to the pill. Last unprotected IC last week. Has been breast and bottle/formula feeding. Started using formula about 2 weeks before returnign to work so that baby could take a bottle/formual when she was t work. When in [place] had problems with him taking breast and so started formula then as well. 9/1 went back to work and given. Her menses are typically once a month, no pain and 4-5 days when not pregnant and no IUD (had an IUD x5 years after 2nd last to baby)   She has also been having intermittent abdominal pain, cramping pain and when touching the belly it will cause th apin. When taking ibuprofen, she has some relief. Constipation present, 3-4 days without a BM. Lower abdominal pain resolves with BM   Still takign PNV, vit d3 and baby ASA Has also been checking her BS at home with FS whenever she feels like her energy is low, sugars typically normal when she checks them   Has taken a pregnancy test 2-3 weeks ago and it was negative. Denies vision changes, has had Has that started during pregnancy.  Denis chest pain and SOB. No nausea or vomiting. Denies fever or chills.     Review of Systems  Constitutional:  Negative for chills and fever.  Respiratory:  Negative for shortness of breath.   Cardiovascular:  Negative for chest pain.  Gastrointestinal:  Negative for nausea and vomiting.  Neurological:  Negative for dizziness and light-headedness.  All other systems reviewed and are negative.      Objective:  BP 100/77   Pulse 80   Ht '5\' 8"'$  (1.727 m)   Wt 222 lb (100.7 kg)   BMI 33.75 kg/m   Physical Exam Vitals and nursing note reviewed.  Constitutional:      Appearance: Normal appearance.  HENT:      Head: Normocephalic and atraumatic.  Cardiovascular:     Rate and Rhythm: Normal rate and regular rhythm.  Pulmonary:     Effort: Pulmonary effort is normal.     Breath sounds: Normal breath sounds.  Abdominal:     General: Bowel sounds are normal.     Palpations: Abdomen is soft.     Tenderness: There is abdominal tenderness.     Comments: Mild lower abdominal tenderness without guarding or rebound  Skin:    General: Skin is warm and dry.  Neurological:     General: No focal deficit present.     Mental Status: She is alert.  Psychiatric:        Mood and Affect: Mood normal.        Behavior: Behavior normal.        Thought Content: Thought content normal.        Judgment: Judgment normal.       Assessment & Plan:  1. Secondary amenorrhea Will obtain labs today. Suspect likely secondary to breast feeding with minipill causing amenorrhea. A1c to also assess glycemic state.  - HgB N5A - TSH - Follicle stimulating hormone  2. Other constipation Likely cause of abdominal pain. Discussed medication and diet changes to allow for regular BM.  - polyethylene glycol (MIRALAX / GLYCOLAX) 17 g  packet; Take 17 g by mouth daily as needed.  Dispense: 14 each; Refill: 0  No follow-ups on file.  02/11/22 Tami Crawford

## 2022-02-11 NOTE — Patient Instructions (Signed)
Food high in fiber to help with constipation  Drinking lots of water

## 2022-02-12 LAB — TSH: TSH: 1.14 u[IU]/mL (ref 0.450–4.500)

## 2022-02-12 LAB — FOLLICLE STIMULATING HORMONE: FSH: 8.7 m[IU]/mL

## 2022-02-12 LAB — HEMOGLOBIN A1C
Est. average glucose Bld gHb Est-mCnc: 131 mg/dL
Hgb A1c MFr Bld: 6.2 % — ABNORMAL HIGH (ref 4.8–5.6)

## 2022-02-13 ENCOUNTER — Other Ambulatory Visit: Payer: Self-pay | Admitting: Obstetrics and Gynecology

## 2022-02-13 DIAGNOSIS — R7303 Prediabetes: Secondary | ICD-10-CM

## 2022-02-16 ENCOUNTER — Telehealth: Payer: Self-pay | Admitting: *Deleted

## 2022-02-16 NOTE — Telephone Encounter (Signed)
Pt left message stating she would like someone to call her and explain her test results.

## 2022-02-17 NOTE — Telephone Encounter (Signed)
Called pt with [facility] interpreter ID [phone]. VM left stating I am calling to review results. Pt may call back if desired. Call back number given.

## 2022-03-10 ENCOUNTER — Ambulatory Visit: Payer: Medicaid Other | Admitting: Physician Assistant

## 2022-03-10 VITALS — BP 116/93 | Ht 68.0 in | Wt 226.0 lb

## 2022-03-10 DIAGNOSIS — G44209 Tension-type headache, unspecified, not intractable: Secondary | ICD-10-CM | POA: Diagnosis not present

## 2022-03-10 DIAGNOSIS — M25561 Pain in right knee: Secondary | ICD-10-CM

## 2022-03-10 NOTE — Progress Notes (Signed)
New Patient Office Visit  Subjective    Patient ID: Tami Crawford, female    DOB: Jul 13, 1985  Age: 36 y.o. MRN: [id number]  CC:  Chief Complaint  Patient presents with  . Knee Pain    2 weeks   . Headache    HPI Tami Crawford   Right knee with pain for past 2 weeks - swelling ; no injury / trauma  Night time swelling time worse  Tried tylenol with a little relief - heating pads   Headache for the past month  sometimes dizzy - present all the time  - tylenol with headache  Water - 4- 6 bottles of water Breastfeeding  Sleep is good  depsite baby   [facility]      Outpatient Encounter Medications as of 03/10/2022  Medication Sig  . Accu-Chek Softclix Lancets lancets To check blood sugars 4 times a day, fasting and 2 hours after first bite of breakfast, lunch and supper/dinner. (Patient not taking: Reported on 03/10/2022)  . acetaminophen (TYLENOL) 325 MG tablet Take 650 mg by mouth every 6 (six) hours as needed. (Patient not taking: Reported on 03/10/2022)  . Blood Glucose Monitoring Suppl (ACCU-CHEK GUIDE) w/Device KIT See admin instructions. (Patient not taking: Reported on 03/10/2022)  . Blood Pressure Monitoring (BLOOD PRESSURE KIT) DEVI 1 Device by Does not apply route once a week. (Patient not taking: Reported on 03/10/2022)  . calcium carbonate (TUMS - DOSED IN MG ELEMENTAL CALCIUM) 500 MG chewable tablet Chew 1 tablet by mouth daily. (Patient not taking: Reported on 03/10/2022)  . furosemide (LASIX) 20 MG tablet Take 1 tablet (20 mg total) by mouth daily. (Patient not taking: Reported on 11/10/2021)  . glucose blood (ACCU-CHEK GUIDE) test strip To check blood sugars 4 times a day, fasting and 2 hours after the first bite of breakfast, lunch and dinner. (Patient not taking: Reported on 03/10/2022)  . ibuprofen (ADVIL) 600 MG tablet Take 1 tablet (600 mg total) by mouth every 6 (six) hours. (Patient not taking: Reported on 03/10/2022)  . metFORMIN (GLUCOPHAGE)  1000 MG tablet Take 1 tablet (1,000 mg total) by mouth 2 (two) times daily with a meal. (Patient not taking: Reported on 11/10/2021)  . norethindrone (MICRONOR) 0.35 MG tablet Take 1 tablet (0.35 mg total) by mouth daily. (Patient not taking: Reported on 02/11/2022)  . polyethylene glycol (MIRALAX / GLYCOLAX) 17 g packet Take 17 g by mouth daily as needed. (Patient not taking: Reported on 03/10/2022)  . Prenatal Vit-Fe Fumarate-FA (PRENATAL MULTIVITAMIN) TABS tablet Take 1 tablet by mouth daily at 12 noon. (Patient not taking: Reported on 03/10/2022)   No facility-administered encounter medications on file as of 03/10/2022.    Past Medical History:  Diagnosis Date  . Medical history non-contributory     Past Surgical History:  Procedure Laterality Date  . PERINEAL LACERATION REPAIR N/A 04/28/2013   Procedure: SUTURE REPAIR PERINEAL LACERATION;  Surgeon: Thurnell Lose, MD;  Location: [facility];  Service: Gynecology;  Laterality: N/A;    Family History  Problem Relation Age of Onset  . Asthma Neg Hx   . Cancer Neg Hx   . Diabetes Neg Hx   . Hypertension Neg Hx   . Heart disease Neg Hx   . Stroke Neg Hx     Social History   Socioeconomic History  . Marital status: Married    Spouse name: Not on file  . Number of children: Not on file  . Years of education: Not  on file  . Highest education level: Not on file  Occupational History  . Not on file  Tobacco Use  . Smoking status: Never  . Smokeless tobacco: Never  Vaping Use  . Vaping Use: Never used  Substance and Sexual Activity  . Alcohol use: No  . Drug use: No  . Sexual activity: Yes  Other Topics Concern  . Not on file  Social History Narrative  . Not on file   Social Determinants of Health   Financial Resource Strain: Not on file  Food Insecurity: No Food Insecurity (05/19/2021)   Hunger Vital Sign   . Worried About Charity fundraiser in the Last Year: Never true   . Ran Out of Food in the Last Year: Never true   Transportation Needs: No Transportation Needs (05/19/2021)   PRAPARE - Transportation   . Lack of Transportation (Medical): No   . Lack of Transportation (Non-Medical): No  Physical Activity: Not on file  Stress: Not on file  Social Connections: Not on file  Intimate Partner Violence: Not on file    ROS      Objective    Ht _0  (1.727 m)   Wt 226 lb (102.5 kg)   BMI 34.36 kg/m   Physical Exam  {Labs (Optional):23779}    Assessment & Plan:   Problem List Items Addressed This Visit   None   No follow-ups on file.   Loraine Grip Mayers, PA-C

## 2022-03-11 ENCOUNTER — Encounter: Payer: Self-pay | Admitting: Physician Assistant

## 2022-03-11 NOTE — Patient Instructions (Addendum)
Acute Knee Pain, Adult La douleur aigu au genou se traduit par Tami Crawford. Elle peut tre due  une lsion, un gonflement ou une irritation des muscles et des tissus qui soutiennent le genou. La douleur peut avoir t provoque par : [employer]. Une blessure au genou due  un mouvement de torsion. Un coup au genou. Une infection. La douleur aigu au genou peut disparatre d'elle-mme [employer] et du repos. Si ce n'est pas le cas, votre prestataire de soins de sant pourra prescrire des examens pour dterminer la cause de Engineer, technical sales. Ces examens pourraient comprendre : Des examens d'imagerie, comme une radiographie, une IRM, une tomodensitomtrie ou [place]. Une arthrocentse. Lors de Transport planner, du liquide est prlev du genou et analys. Une arthroscopie. Lors de Transport planner, un tube clair est insr dans le genou et [employer] est projete sur un cran de tlvision. Une biopsie. Lors de Transport planner, un chantillon de tissu est prlev et examin au microscope. [place] les instructions suivantes  domicile : Si vous portez une genouillre ou une orthse :  Portez la genouillre ou l'orthse conformment aux indications de votre prestataire de soins de sant. Retirez-la uniquement lorsque votre prestataire de soins de sant vous y autorise. Desserrez-la si vous ressentez un fourmillement dans vos orteils, ou bien s'ils s'engourdissent ou deviennent froids et bleus. Gardez-la propre. Si la genouillre ou l'orthse n'est pas tanche : Ne la laissez pas se mouiller. Enveloppez-la d'une protection tanche lorsque vous prenez un bain ou une douche. Activits Laissez votre genou au repos. Ne faites rien Materials engineer ou IT sales professional. vitez les activits ou les exercices  impact lev, comme la course, le saut  la corde, ou les sauts sur place Tami Crawford. Avec l'aide d'une kinsithrapeute, laborez un programme d'exercices adapt, conformment Sports administrator de votre prestataire de soins de sant. Faites les exercices tel que recommand par votre kinsithrapeute. Prise en charge de Engineer, technical sales, de la rigidit et des gonflements  Si le prestataire de soins de sant vous l'a recommand, appliquez de la [place] Crawford genou concern. Pour ce faire : Si vous portez une genouillre ou une orthse amovible, retirez-la en suivant les indications de votre prestataire de soins de sant. Mettez de la [employer] un sac en plastique. Placez une serviette entre votre peau et le sac. Laissez la glace en place pendant 20 minutes, 2  3 fois par jour. Retirez la glace si votre peau devient rouge vif. Cela est trs important. Si vous ne ressentez Management consultant, la chaleur ou le froid, vous pourriez endommager votre peau plus facilement. Si votre prestataire de soins de sant vous le recommande, utilisez un bandage lastique pour exercer une pression (compression) sur votre genou bless. Cela pourra aider  Physiological scientist, soutenir votre genou et soulager la gne. Surlevez Tami Crawford) votre genou au-dessus du niveau du cour lorsque vous tes assis(e) ou allong(e). Dormez Insurance claims handler genou. Instructions gnrales Prenez vos mdicaments en vente libre et sur ordonnance en suivant scrupuleusement les instructions de votre prestataire de soins de sant. N'utilisez pas de [employer] tabac ou de la nicotine, tels que les cigarettes, les cigarettes lectroniques et Musician. Si vous avez besoin d'aide pour arrter de fumer, demandez conseil  votre prestataire de soins de sant. Si vous tes en surpoids, votre prestataire de soins de sant et un ditticien pourront vous Producer, television/film/video un objectif de perte de poids raliste  et sain. L'excs de [employer] exercer une pression sur votre genou. Prtez attention  tout changement dans vos symptmes. Rendez-vous  toutes vos visites de suivi. C'est important. Prenez  contact avec un prestataire de soins de sant si : Votre douleur au genou persiste, volue ou s'aggrave. Vous avez de la fivre ainsi que des Valero Crawford genou. Votre genou est chaud au toucher ou devient rouge. Votre genou se tord ou se bloque. Demandez immdiatement de l'aide si : Votre genou enfle et que l'enflure s'aggrave. Vous ne pouvez plus bouger votre genou. Vous ressentez une douleur intense dans votre genou qui n'est pas soulage par [employer]. Rsum La douleur aigu au genou peut tre cause par [employer], une blessure, une infection, une lsion, un gonflement ou une irritation des tissus qui soutiennent le genou. Votre prestataire de soins de sant pourra vous faire passer des examens pour dterminer la cause de Engineer, technical sales. Prtez attention  tout changement dans vos symptmes. Soulagez votre Pharmacist, community votre genou au repos, en prenant des mdicaments, en rduisant votre activit au minimum et en appliquant de la glace. Consultez immdiatement si votre genou enfle, si vous ne parvenez Quarry manager ou si vous ressentez une douleur intense qui n'est pas soulage par les mdicaments. Ces conseils et renseignements ne sauraient se substituer  l'avis mdical de votre prestataire de soins de sant. Par consquent, il est primordial de parler de toutes vos proccupations avec votre prestataire de soins de sant. Document Revised: 11/21/2019 Document Reviewed: 11/21/2019 Elsevier Patient Education  [place] (mal de tte) sans cause apparente General Headache Without Cause Une cphale se traduit par [employer] ou une gne au niveau de la tte ou du cou. Il existe plusieurs types de cphales dont les causes peuvent tre varies. Voici quelques types courants : Cphales de tension. Migraines. Cphales en grappe. Cphales chroniques quotidiennes. Parfois, la cause prcise d'une cphale peut rester inconnue. Tami Crawford les instructions suivantes   domicile : Tami Crawford vos symptmes pour Advertising account planner. Informez-en votre prestataire de soins de sant. Suivez les conseils suivants pour Health visitor votre tat : Gestion de la douleur     Prenez vos mdicaments en vente libre et sur ordonnance en suivant scrupuleusement les instructions de votre prestataire de soins de sant. Le traitement pourra comprendre des mdicaments pour soulager la douleur,  Publishing rights manager ou  appliquer sur la peau. Allongez-vous au calme dans l'obscurit lorsque vous souffrez de maux de tte. Si les lumires vives vous incommodent ou aggravent vos maux de tte, tamisez votre clairage. Si votre prestataire de soins de sant le recommande, Nurse, adult de la glace sur votre tte et votre cou : Nurse, adult de la glace dans un sac en plastique. Placez une serviette entre votre peau et le sac. Laissez la glace sur votre peau pendant 20 minutes, 2  3 fois par [employer]. Retirez la glace si votre peau devient rouge vif. Cela est trs important. Si vous ne ressentez Management consultant, la chaleur ou le froid, vous pourriez endommager votre peau plus facilement. Si votre prestataire de soins de sant vous le recommande, appliquez de la [employer] zone [place]. Utilisez la source de Conservation Crawford, nature vous recommande votre prestataire de soins de sant, par exemple une compresse  chaleur humide ou un coussin chauffant. Placez une serviette entre votre peau et la source de Librarian, academic. Laissez la source de chaleur en place pendant 20  30 minutes. Retirez la source de chaleur si votre peau  devient rouge vif. Cela est particulirement important si vous ne ressentez Management consultant, la chaleur ou le froid. Vous risqueriez de vous brler. Alimentation et boissons Mange[place]  heures rgulires. Si vous consommez de l'alcool : Tami Crawford consommation  : 1 verre par jour pour Tami Crawford femmes qui ne sont pas enceintes. 2 verres par Tami Crawford. Sachez quelle quantit d'alcool est  contenue dans un verre. Aux .-U., un verre correspond  une bouteille de bire (355 ml [12 onces]),  un verre de vin (148 ml [5 onces]) ou  un verre  liqueur d'alcool fort (44 ml [1,5 once]). Arrtez de consommer de la cafine ou rduisez-en la quantit. Buvez des quantits suffisantes de liquides pour garder votre urine jaune ple. Instructions gnrales  Tami Crawford un journal de vos cphales afin de dterminer ce qui les dclenche. Par exemple, notez : Ce que vous mangez et [employer]. Votre temps de sommeil. Tout changement dans votre alimentation ou dans vos mdicaments. Essayez les massages et autres techniques de relaxation. Rduisez votre stress. Tenez-vous droit(e) lorsque vous tes assis(e) et ne tendez pas vos muscles. N'utilisez pas de produits contenant de la nicotine ou du tabac. Il s'agit notamment des cigarettes, du tabac  mcher et des vapoteuses, comme les cigarettes lectroniques. Si vous avez besoin d'aide pour arrter de fumer, demandez conseil  votre prestataire de soins de sant. Faites de Chief Executive Crawford de votre prestataire de soins de sant. Dormez  heures rgulires. Dormez 7  9 heures chaque nuit ou le temps recommand par votre prestataire de soins de sant. Rendez-vous  toutes les visites de suivi. C'est important. Prenez contact avec un prestataire de soins de sant si : Les mdicaments ne soulagent pas vos symptmes. Vous avez une cphale qui est diffrente de vos maux de tte habituels. Vous avez des nauses ou vous vomissez. Vous avez de la fivre. Demandez immdiatement de l'aide si : Votre cphale : Devient rapidement intense. S'aggrave aprs que vous avez pratiqu une activit physique modre  intense. Vous prsentez l'un de ces symptmes : Des vomissements rpts. Une douleur ou une raideur au niveau du cou. Des changements au niveau de votre vision. [employer] un oil ou une [place]. Des problmes  d'locution. Une faiblesse ou une perte de contrle au niveau des muscles. Une perte de l'quilibre ou de la coordination. Vous vous sentez faible ou vous vanouissez. Vous tes plong(e) dans un tat de confusion. Vous avez une crise. Ces symptmes peuvent tre le signe d'un problme grave et Statistician situation Engineer, site. N'attendez pas de voir si les symptmes disparaissent. Vous devez consulter immdiatement un mdecin. Appelez les Administrator, Civil Service locaux (911 aux [place]). Ne prenez Chief Technology Crawford, faites-vous accompagner  l'hpital. Rsum Une cphale se traduit par [employer] Airlines ou une gne au niveau de la tte ou du cou. Il existe plusieurs types de cphales dont les causes peuvent tre varies. Dans certains cas, la cause peut rester inconnue. Tami Crawford un journal de vos cphales afin de dterminer ce qui les dclenche. Surveillez vos symptmes pour Advertising account planner. Informez-en votre prestataire de soins de sant. Contactez un prestataire de soins de sant si vous avez un mal de tte diffrent de vos maux de tte habituels ou si vos symptmes ne sont pas soulags par les mdicaments. Consultez immdiatement si votre mal de tte devient intense, si vous vomissez, si vous avez une perte de vision, si vous perdez l'quilibre ou si vous avez une crise. Ces conseils et renseignements  ne sauraient se substituer  l'avis mdical de votre prestataire de soins de sant. Par consquent, il est primordial de parler de toutes vos proccupations avec votre prestataire de soins de sant. Document Revised: 10/10/2020 Document Reviewed: 10/10/2020 Elsevier Patient Education  [place] Patient Education  [place].

## 2022-03-12 ENCOUNTER — Ambulatory Visit: Payer: Self-pay | Admitting: *Deleted

## 2022-03-12 ENCOUNTER — Telehealth: Payer: Self-pay

## 2022-03-12 NOTE — Telephone Encounter (Signed)
Reason for Disposition  Knee swelling is a chronic symptom (recurrent or ongoing AND present > 4 weeks)  Answer Assessment - Initial Assessment Questions 1. LOCATION: "Where is the swelling located?"  (e.g., left, right, both knees)     R knee 2. ONSET: "When did the swelling start?" "Does it come and go, or is it there all the time?"     Over 1 week, comes and goes 3. SWELLING: "How bad is the swelling?" Or, "How large is it?" (e.g., mild, moderate, severe; size of localized swelling)    - NONE: No joint swelling.   - LOCALIZED: Localized; small area of puffy or swollen skin (e.g., insect bite, skin irritation).   - MILD: Joint looks or feels mildly swollen or puffy.   - MODERATE: Swollen; interferes with normal activities (e.g., work or school); can't move joint normally (bend and straighten completely); may be limping.   - SEVERE: Very swollen; can't move swollen joint at all; limping a lot or unable to walk.     moderate 4. PAIN: "Is there any pain?" If Yes, ask: "How bad is it?" (Scale 1-10; or mild, moderate, severe)   - NONE (0): no pain.   - MILD (1-3): doesn't interfere with normal activities.    - MODERATE (4-7): interferes with normal activities (e.g., work or school) or awakens from sleep, limping.    - SEVERE (8-10): excruciating pain, unable to do any normal activities, unable to walk.      10- 3 days 5. SETTING: "Has there been any recent work, exercise or other activity that involved that part of the body?"      no 6. AGGRAVATING FACTORS: "What makes the knee swelling worse?" (e.g., walking, climbing stairs, running)     3 days -stays swollen 7. ASSOCIATED SYMPTOMS: "Is there any pain or redness?"     no 8. OTHER SYMPTOMS: "Do you have any other symptoms?" (e.g., chest pain, difficulty breathing, fever, calf pain)     no  Protocols used: Knee Swelling-A-AH

## 2022-03-12 NOTE — Telephone Encounter (Signed)
  Chief Complaint: knee pain Symptoms: R knee swelling and pain Frequency: chronic- but worse over the last week Pertinent Negatives: Patient denies  chest pain, difficulty breathing, fever, calf pain  Disposition: '[]'$ ED /'[]'$ Urgent Care (no appt availability in office) / '[]'$ Appointment(In office/virtual)/ '[]'$  [facility]/ '[]'$ Home Care/ '[]'$ Refused Recommended Disposition /'[]'$ [facility]/ '[x]'$  Follow-up with PCP Additional Notes: Patient was seen this week at mobile unit- x ray was ordered- patient was not aware- advised patient to go to radiology for x -ray of her knee- that way she will get possible reason for the swelling and pain.Patient will get x -ray done so she can have proper follow up.

## 2022-03-12 NOTE — Telephone Encounter (Signed)
Pt spoke to NT yesterday advised to go to the mobile unit. Pt went to the ED. Called while at the ed that there was no room reserved. When the patient called the mobile unit was close.

## 2022-03-13 ENCOUNTER — Ambulatory Visit (HOSPITAL_COMMUNITY)
Admission: RE | Admit: 2022-03-13 | Discharge: 2022-03-13 | Disposition: A | Discharge status: Home / Self Care | Payer: Medicaid Other | Source: Ambulatory Visit | Attending: Physician Assistant | Admitting: Physician Assistant

## 2022-03-13 DIAGNOSIS — M25561 Pain in right knee: Secondary | ICD-10-CM | POA: Diagnosis present

## 2022-03-13 NOTE — Telephone Encounter (Signed)
Pt called back, advised of needing to go to [facility] to Radiology for XR. Pt verbalized understanding and asked if I could send her address. Advised pt I would send Mychart message with address for [facility] [address] entrance and for pt to go before 5pm today.

## 2022-03-13 NOTE — Telephone Encounter (Signed)
Patient called, left VM to return the call to [facility] [phone]. Patient's husband called, he says she's at work, asked him to have her call to speak to a nurse at [facility] [phone]. When she returns the call, she will need to be advised to go to the radiology department at the hospital to have her knee xray, the order is in the computer already under imaging.

## 2022-03-13 NOTE — Telephone Encounter (Signed)
Pt called asking what should she do.  Knee pain.  She thought she had ana ppt at the hospital..  She called and spoke with someone and they told her to call back today.

## 2022-03-16 NOTE — Addendum Note (Signed)
Addended by: Kennieth Rad on: 03/16/2022 02:19 PM   Modules accepted: Orders

## 2022-03-20 ENCOUNTER — Other Ambulatory Visit (HOSPITAL_BASED_OUTPATIENT_CLINIC_OR_DEPARTMENT_OTHER): Payer: Self-pay

## 2022-03-20 ENCOUNTER — Ambulatory Visit (INDEPENDENT_AMBULATORY_CARE_PROVIDER_SITE_OTHER): Payer: Medicaid Other | Admitting: Orthopaedic Surgery

## 2022-03-20 DIAGNOSIS — M7631 Iliotibial band syndrome, right leg: Secondary | ICD-10-CM | POA: Diagnosis not present

## 2022-03-20 MED ORDER — DICLOFENAC SODIUM 1 % EX GEL
4.0000 g | Freq: Four times a day (QID) | CUTANEOUS | 0 refills | Status: DC
  Filled 2022-03-20: qty 100, 7d supply, fill #0

## 2022-03-20 NOTE — Progress Notes (Signed)
Chief Complaint: Right knee pain     History of Present Illness:    Tami Crawford is a 36 y.o. female presents with atraumatic right knee pain and tenderness about the lateral aspect of the knee.  She is experiencing swelling and pain prickly with prolonged standing.  She has been taking ibuprofen as well as Tylenol without significant relief.  She states that the knee is stiff in the morning.  This awakens her at night.    Surgical History:   None  PMH/PSH/Family History/Social History/Meds/Allergies:    Past Medical History:  Diagnosis Date   Medical history non-contributory    Past Surgical History:  Procedure Laterality Date   PERINEAL LACERATION REPAIR N/A 04/28/2013   Procedure: SUTURE REPAIR PERINEAL LACERATION;  Surgeon: Thurnell Lose, MD;  Location: [facility];  Service: Gynecology;  Laterality: N/A;   Social History   Socioeconomic History   Marital status: Married    Spouse name: Not on file   Number of children: Not on file   Years of education: Not on file   Highest education level: Not on file  Occupational History   Not on file  Tobacco Use   Smoking status: Never   Smokeless tobacco: Never  Vaping Use   Vaping Use: Never used  Substance and Sexual Activity   Alcohol use: No   Drug use: No   Sexual activity: Yes  Other Topics Concern   Not on file  Social History Narrative   Not on file   Social Determinants of Health   Financial Resource Strain: Not on file  Food Insecurity: No Food Insecurity (05/19/2021)   Hunger Vital Sign    Worried About Running Out of Food in the Last Year: Never true    Ran Out of Food in the Last Year: Never true  Transportation Needs: No Transportation Needs (05/19/2021)   PRAPARE - Hydrologist (Medical): No    Lack of Transportation (Non-Medical): No  Physical Activity: Not on file  Stress: Not on file  Social Connections: Not on file   Family  History  Problem Relation Age of Onset   Asthma Neg Hx    Cancer Neg Hx    Diabetes Neg Hx    Hypertension Neg Hx    Heart disease Neg Hx    Stroke Neg Hx    No Known Allergies Current Outpatient Medications  Medication Sig Dispense Refill   diclofenac Sodium (VOLTAREN) 1 % GEL Apply 4 grams topically 4 (four) times daily. 100 g 0   Accu-Chek Softclix Lancets lancets To check blood sugars 4 times a day, fasting and 2 hours after first bite of breakfast, lunch and supper/dinner. (Patient not taking: Reported on 03/10/2022) 100 each 12   acetaminophen (TYLENOL) 325 MG tablet Take 650 mg by mouth every 6 (six) hours as needed. (Patient not taking: Reported on 03/10/2022)     Blood Glucose Monitoring Suppl (ACCU-CHEK GUIDE) w/Device KIT See admin instructions. (Patient not taking: Reported on 03/10/2022)     Blood Pressure Monitoring (BLOOD PRESSURE KIT) DEVI 1 Device by Does not apply route once a week. (Patient not taking: Reported on 03/10/2022) 1 each 0   calcium carbonate (TUMS - DOSED IN MG ELEMENTAL CALCIUM) 500 MG chewable tablet Chew 1 tablet by mouth daily. (Patient not  taking: Reported on 03/10/2022)     furosemide (LASIX) 20 MG tablet Take 1 tablet (20 mg total) by mouth daily. (Patient not taking: Reported on 11/10/2021) 5 tablet 0   glucose blood (ACCU-CHEK GUIDE) test strip To check blood sugars 4 times a day, fasting and 2 hours after the first bite of breakfast, lunch and dinner. (Patient not taking: Reported on 03/10/2022) 100 each 12   ibuprofen (ADVIL) 600 MG tablet Take 1 tablet (600 mg total) by mouth every 6 (six) hours. (Patient not taking: Reported on 03/10/2022) 30 tablet 0   metFORMIN (GLUCOPHAGE) 1000 MG tablet Take 1 tablet (1,000 mg total) by mouth 2 (two) times daily with a meal. (Patient not taking: Reported on 11/10/2021) 60 tablet 2   norethindrone (MICRONOR) 0.35 MG tablet Take 1 tablet (0.35 mg total) by mouth daily. (Patient not taking: Reported on 02/11/2022) 28 tablet  12   Prenatal Vit-Fe Fumarate-FA (PRENATAL MULTIVITAMIN) TABS tablet Take 1 tablet by mouth daily at 12 noon. (Patient not taking: Reported on 03/10/2022)     No current facility-administered medications for this visit.   No results found.  Review of Systems:   A ROS was performed including pertinent positives and negatives as documented in the HPI.  Physical Exam :   Constitutional: NAD and appears stated age Neurological: Alert and oriented Psych: Appropriate affect and cooperative currently breastfeeding.   Comprehensive Musculoskeletal Exam:    Tenderness palpation about the lateral IT band.  Negative Lachman, negative posterior drawer, no laxity with varus valgus stress.  No joint line tenderness.  Imaging:   Xray (4 views right knee): Normal    I personally reviewed and interpreted the radiographs.   Assessment:   36 y.o. female with lateral joint line tenderness consistent with a lateral IT band syndrome.  To that effect I recommended ultrasound-guided injection of the knee.  Today's visit she would like to defer.  As result I would like to prescribe her an anti-inflammatory topical that she can use over the knee.  I will plan to see her back on an as-needed basis.  Plan :    -Return to clinic as needed     I personally saw and evaluated the patient, and participated in the management and treatment plan.  Vanetta Mulders, MD Attending Physician, Orthopedic Surgery  This document was dictated using Dragon voice recognition software. A reasonable attempt at proof reading has been made to minimize errors.

## 2022-03-31 ENCOUNTER — Ambulatory Visit: Payer: Medicaid Other | Admitting: Physician Assistant

## 2022-03-31 ENCOUNTER — Encounter: Payer: Self-pay | Admitting: Physician Assistant

## 2022-03-31 VITALS — BP 115/83 | HR 75 | Ht 68.0 in | Wt 228.0 lb

## 2022-03-31 DIAGNOSIS — H1013 Acute atopic conjunctivitis, bilateral: Secondary | ICD-10-CM | POA: Diagnosis not present

## 2022-03-31 NOTE — Progress Notes (Unsigned)
   Established Patient Office Visit  Subjective   Patient ID: Tami Crawford, female    DOB: 1986/02/27  Age: 36 y.o. MRN: [id number]  Chief Complaint  Patient presents with   Irriation of eyes    Patient states this happens in the morning, She has been using eye drops for instant relief     Wtery and white in corners of eyes - very red For a past couple  Allergy eye drop Itchy eyes  Lumify redness reliever      {History (Optional):23778}  ROS    Objective:     BP 115/83 (BP Location: Left Arm, Patient Position: Sitting, Cuff Size: Large)   Pulse 75   Ht '5\' 8"'$  (1.727 m)   Wt 228 lb (103.4 kg)   SpO2 99%   BMI 34.67 kg/m  {Vitals History (Optional):23777}  Physical Exam   No results found for any visits on 03/31/22.  {Labs (Optional):23779}  The ASCVD Risk score (Arnett DK, et al., 2019) failed to calculate for the following reasons:   The 2019 ASCVD risk score is only valid for ages 9 to 76    Assessment & Plan:   Problem List Items Addressed This Visit   None   No follow-ups on file.    Loraine Grip Mayers, PA-C

## 2022-03-31 NOTE — Patient Instructions (Addendum)
I encourage you to use Pataday eye drops as directed.  You will purchase these OTC.  I encourage you to schedule an eye exam as well  Once you have stopped breastfeeding , I encourage you to use Zyrtec once daily.  Please let us know if there is anything else we can do for you    Kennieth Rad, PA-C Physician Assistant [facility] [email or web address]  Allergies, Adult An allergy is a condition in which the body's defense system (immune system) comes in contact with an allergen and reacts to it. An allergen is anything that causes an allergic reaction. Allergens cause the immune system to make proteins for fighting infections (antibodies). These antibodies cause cells to release chemicals called histamines that set off the symptoms of an allergic reaction. Allergies often affect the nasal passages (allergic rhinitis), eyes (allergic conjunctivitis), skin (atopic dermatitis), and stomach. Allergies can be mild, moderate, or severe. They cannot spread from person to person. Allergies can develop at any age and may be outgrown. What are the causes? This condition is caused by allergens. Common allergens include: Outdoor allergens, such as pollen, car fumes, and mold. Indoor allergens, such as dust, smoke, mold, and pet dander. Other allergens, such as foods, medicines, scents, insect bites or stings, and other skin irritants. What increases the risk? You are more likely to develop this condition if you have: Family members with allergies. Family members who have any condition that may be caused by allergens, such as asthma. This may make you more likely to have other allergies. What are the signs or symptoms? Symptoms of this condition depend on the severity of the allergy. Mild to moderate symptoms Runny nose, stuffy nose (nasal congestion), or sneezing. Itchy mouth, ears, or throat. A feeling of mucus dripping down the back of your  throat (postnasal drip). Sore throat. Itchy, red, watery, or puffy eyes. Skin rash, or itchy, red, swollen areas of skin (hives). Stomach cramps or bloating. Severe symptoms Severe allergies to food, medicine, or insect bites may cause anaphylaxis, which can be life-threatening. Symptoms include: A red (flushed) face. Wheezing or coughing. Swollen lips, tongue, or mouth. Tight or swollen throat. Chest pain or tightness, or rapid heartbeat. Trouble breathing or shortness of breath. Pain in the abdomen, vomiting, or diarrhea. Dizziness or fainting. How is this diagnosed? This condition is diagnosed based on your symptoms, your family and medical history, and a physical exam. You may also have tests, including: Skin tests to see how your skin reacts to allergens that may be causing your symptoms. Tests include: Skin prick test. For this test, an allergen is introduced to your body through a small opening in the skin. Intradermal skin test. For this test, a small amount of allergen is injected under the first layer of your skin. Patch test. For this test, a small amount of allergen is placed on your skin. The area is covered and then checked after a few days. Blood tests. A challenge test. For this test, you will eat or breathe in a small amount of allergen to see if you have an allergic reaction. You may also be asked to: Keep a food diary. This is a record of all the foods, drinks, and symptoms you have in a day. Try an elimination diet. To do this: Remove certain foods from your diet. Add those foods back one by one to find out if any foods cause an allergic reaction. How is this treated?     Treatment for allergies  depends on your symptoms. Treatment may include: Cold, wet cloths (cold compresses) to soothe itching and swelling. Eye drops or nasal sprays. Nasal irrigation to help clear your mucus or keep the nasal passages moist. A humidifier to add moisture to the air. Skin  creams to treat rashes or itching. Oral antihistamines or other medicines to block the reaction or to treat inflammation. Diet changes to remove foods that cause allergies. Being exposed again and again to tiny amounts of allergens to help you build a defense against it (tolerance). This is called immunotherapy. Examples include: Allergy shot. You receive an injection that contains an allergen. Sublingual immunotherapy. You take a small dose of allergen under your tongue. Emergency injection for anaphylaxis. You give yourself a shot using a syringe (auto-injector) that contains the amount of medicine you need. Your health care provider will teach you how to give yourself an injection. Follow these instructions at home: Medicines  Take or apply over-the-counter and prescription medicines only as told by your health care provider. Always carry your auto-injector pen if you are at risk of anaphylaxis. Give yourself an injection as told by your health care provider. Eating and drinking Follow instructions from your health care provider about eating or drinking restrictions. Drink enough fluid to keep your urine pale yellow. General instructions Wear a medical alert bracelet or necklace to let others know that you have had anaphylaxis before. Avoid known allergens whenever possible. Keep all follow-up visits as told by your health care provider. This is important. Contact a health care provider if: Your symptoms do not get better with treatment. Get help right away if: You have symptoms of anaphylaxis. These include: Swollen mouth, tongue, or throat. Pain or tightness in your chest. Trouble breathing or shortness of breath. Dizziness or fainting. Severe abdominal pain, vomiting, or diarrhea. These symptoms may represent a serious problem that is an emergency. Do not wait to see if the symptoms will go away. Get medical help right away. Call your local emergency services (911 in the [place] Do  not drive yourself to the hospital. Summary Take or apply over-the-counter and prescription medicines only as told by your health care provider. Avoid known allergens when possible. Always carry your auto-injector pen if you are at risk of anaphylaxis. Give yourself an injection as told by your health care provider. Wear a medical alert bracelet or necklace to let others know that you have had anaphylaxis before. Anaphylaxis is a life-threatening emergency. Get help right away. This information is not intended to replace advice given to you by your health care provider. Make sure you discuss any questions you have with your health care provider. Document Revised: 12/18/2019 Document Reviewed: 03/01/2019 Elsevier Patient Education  [employer].

## 2022-04-01 ENCOUNTER — Encounter: Payer: Self-pay | Admitting: Physician Assistant

## 2022-04-14 ENCOUNTER — Other Ambulatory Visit (HOSPITAL_BASED_OUTPATIENT_CLINIC_OR_DEPARTMENT_OTHER): Payer: Self-pay

## 2022-04-14 ENCOUNTER — Other Ambulatory Visit (HOSPITAL_BASED_OUTPATIENT_CLINIC_OR_DEPARTMENT_OTHER): Payer: Self-pay | Admitting: Orthopaedic Surgery

## 2022-04-15 ENCOUNTER — Other Ambulatory Visit (HOSPITAL_BASED_OUTPATIENT_CLINIC_OR_DEPARTMENT_OTHER): Payer: Self-pay

## 2022-04-15 MED ORDER — DICLOFENAC SODIUM 1 % EX GEL
4.0000 g | Freq: Four times a day (QID) | CUTANEOUS | 0 refills | Status: DC
  Filled 2022-04-15: qty 100, 7d supply, fill #0

## 2022-04-18 ENCOUNTER — Inpatient Hospital Stay (HOSPITAL_COMMUNITY): Payer: Medicaid Other

## 2022-04-18 ENCOUNTER — Other Ambulatory Visit: Payer: Self-pay

## 2022-04-18 ENCOUNTER — Inpatient Hospital Stay (HOSPITAL_COMMUNITY)
Admission: EM | Admit: 2022-04-18 | Discharge: 2022-04-18 | Disposition: A | Discharge status: Home / Self Care | Payer: Medicaid Other | Attending: Obstetrics & Gynecology | Admitting: Obstetrics & Gynecology

## 2022-04-18 ENCOUNTER — Encounter (HOSPITAL_COMMUNITY): Payer: Self-pay

## 2022-04-18 DIAGNOSIS — R1032 Left lower quadrant pain: Secondary | ICD-10-CM | POA: Insufficient documentation

## 2022-04-18 DIAGNOSIS — O00202 Left ovarian pregnancy without intrauterine pregnancy: Secondary | ICD-10-CM | POA: Insufficient documentation

## 2022-04-18 DIAGNOSIS — Z3A01 Less than 8 weeks gestation of pregnancy: Secondary | ICD-10-CM | POA: Insufficient documentation

## 2022-04-18 DIAGNOSIS — O009 Unspecified ectopic pregnancy without intrauterine pregnancy: Secondary | ICD-10-CM

## 2022-04-18 DIAGNOSIS — O26891 Other specified pregnancy related conditions, first trimester: Secondary | ICD-10-CM | POA: Diagnosis not present

## 2022-04-18 HISTORY — DX: Unspecified ectopic pregnancy without intrauterine pregnancy: O00.90

## 2022-04-18 HISTORY — DX: Headache, unspecified: R51.9

## 2022-04-18 HISTORY — DX: Gestational diabetes mellitus in pregnancy, unspecified control: O24.419

## 2022-04-18 LAB — CBC WITH DIFFERENTIAL/PLATELET
Abs Immature Granulocytes: 0.03 10*3/uL (ref 0.00–0.07)
Basophils Absolute: 0 10*3/uL (ref 0.0–0.1)
Basophils Relative: 1 %
Eosinophils Absolute: 0.2 10*3/uL (ref 0.0–0.5)
Eosinophils Relative: 2 %
HCT: 37.4 % (ref 36.0–46.0)
Hemoglobin: 12.8 g/dL (ref 12.0–15.0)
Immature Granulocytes: 0 %
Lymphocytes Relative: 43 %
Lymphs Abs: 3.5 10*3/uL (ref 0.7–4.0)
MCH: 28.6 pg (ref 26.0–34.0)
MCHC: 34.2 g/dL (ref 30.0–36.0)
MCV: 83.7 fL (ref 80.0–100.0)
Monocytes Absolute: 0.9 10*3/uL (ref 0.1–1.0)
Monocytes Relative: 11 %
Neutro Abs: 3.4 10*3/uL (ref 1.7–7.7)
Neutrophils Relative %: 43 %
Platelets: 232 10*3/uL (ref 150–400)
RBC: 4.47 MIL/uL (ref 3.87–5.11)
RDW: 13 % (ref 11.5–15.5)
WBC: 8 10*3/uL (ref 4.0–10.5)
nRBC: 0 % (ref 0.0–0.2)

## 2022-04-18 LAB — WET PREP, GENITAL
Clue Cells Wet Prep HPF POC: NONE SEEN
Sperm: NONE SEEN
Trich, Wet Prep: NONE SEEN
WBC, Wet Prep HPF POC: <10 (ref <10)
Yeast Wet Prep HPF POC: NONE SEEN

## 2022-04-18 LAB — URINALYSIS, ROUTINE W REFLEX MICROSCOPIC
Bacteria, UA: NONE SEEN
Bilirubin Urine: NEGATIVE
Glucose, UA: NEGATIVE mg/dL
Ketones, ur: NEGATIVE mg/dL
Leukocytes,Ua: NEGATIVE
Nitrite: NEGATIVE
Protein, ur: NEGATIVE mg/dL
Specific Gravity, Urine: 1.019 (ref 1.005–1.030)
pH: 7 (ref 5.0–8.0)

## 2022-04-18 LAB — COMPREHENSIVE METABOLIC PANEL
ALT: 18 U/L (ref 0–44)
AST: 21 U/L (ref 15–41)
Albumin: 3.6 g/dL (ref 3.5–5.0)
Alkaline Phosphatase: 80 U/L (ref 38–126)
Anion gap: 8 (ref 5–15)
BUN: 11 mg/dL (ref 6–20)
CO2: 23 mmol/L (ref 22–32)
Calcium: 8.9 mg/dL (ref 8.9–10.3)
Chloride: 105 mmol/L (ref 98–111)
Creatinine, Ser: 0.63 mg/dL (ref 0.44–1.00)
GFR, Estimated: >60 mL/min (ref >60)
Glucose, Bld: 106 mg/dL — ABNORMAL HIGH (ref 70–99)
Potassium: 3.6 mmol/L (ref 3.5–5.1)
Sodium: 136 mmol/L (ref 135–145)
Total Bilirubin: 0.7 mg/dL (ref 0.3–1.2)
Total Protein: 6.8 g/dL (ref 6.5–8.1)

## 2022-04-18 LAB — HCG, QUANTITATIVE, PREGNANCY: hCG, Beta Chain, Quant, S: 4457 m[IU]/mL — ABNORMAL HIGH (ref <5)

## 2022-04-18 LAB — TYPE AND SCREEN
ABO/RH(D): O POS
Antibody Screen: NEGATIVE

## 2022-04-18 LAB — I-STAT BETA HCG BLOOD, ED (MC, WL, AP ONLY): I-stat hCG, quantitative: >2000 m[IU]/mL — ABNORMAL HIGH (ref <5)

## 2022-04-18 MED ORDER — METHOTREXATE FOR ECTOPIC PREGNANCY
50.0000 mg/m2 | Freq: Once | INTRAMUSCULAR | Status: AC
Start: 2022-04-18 — End: 2022-04-18
  Administered 2022-04-18: 110 mg via INTRAMUSCULAR
  Filled 2022-04-18: qty 4.4

## 2022-04-18 MED ORDER — HYDROCODONE-ACETAMINOPHEN 5-325 MG PO TABS
1.0000 | ORAL_TABLET | Freq: Once | ORAL | Status: AC
  Administered 2022-04-18: 1 via ORAL
  Filled 2022-04-18: qty 1

## 2022-04-18 NOTE — MAU Note (Addendum)
.  Tami Crawford is a 36 y.o. at Unknown here in MAU reporting: transfer from [facility].c/o abd pain on her left side that wraps around  to her back  x 3 days.and vaginal bleeding/spotting x 8days and thinks it is her period. Had been on birth control pills LMP: 03/17/22 Onset of complaint: 3 days Pain score: 5 (Before given vicoden in ED  pain was 9/10) BP 105/66   Pulse 75   Temp 98.2 F (36.8 C)   Resp 18   LMP 03/17/2022   SpO2 100%     FHT:n/a Lab orders placed from triage:

## 2022-04-18 NOTE — MAU Provider Note (Cosign Needed Addendum)
History     CSN: [id number]  Arrival date and time: 04/18/22 0946   Event Date/Time   First Provider Initiated Contact with Patient 04/18/22 1247      Chief Complaint  Patient presents with   Abdominal Pain   Attestation of Attending Supervision of Advanced Practitioner (CNM/NP/PA): Evaluation and management procedures were performed by the Advanced Practitioner under my supervision and collaboration.  I have reviewed the Advanced Practitioner's note and chart, and I agree with the management and plan. I counseled the patient and her husband with Audio interpreter in [place]. I reviewed her [place] report and images and the HCG result. Her abdominal exam is benign. The amount of pelvic free fluid is small and may not be diagnostic of ruptured ectopic. We discussed options of surgery and possible salpingectomy, expectant management and repeat HCG in 48 hr and treatment for ectopic pregnancy with methotrexate with the high likelihood of abnormal extrauterine pregnancy given US findings and HCG. Their questions were answered. She elected to receive MTX and have indicated follow-up. She was given detailed instructions.  Emeterio Reeve MD    Abdominal Pain   Tami Crawford is a 36 y.o. [id number] at 43w4dwho [facility] pain. Pain  [facility] s/p PO pain medication. Pain radiates to her left mid-back. Pain is aggravated by movement. She denies alleviating factors.   Patient drank a glass of milk first thing this morning but has otherwise been NPO today  OB History     Gravida  4   Para  3   Term  3   Preterm  0   AB  0   Living  3      SAB  0   IAB  0   Ectopic  0   Multiple  0   Live Births  3           Past Medical History:  Diagnosis Date   Medical history non-contributory     Past Surgical History:  Procedure Laterality Date   PERINEAL LACERATION REPAIR N/A 04/28/2013    Procedure: SUTURE REPAIR PERINEAL LACERATION;  Surgeon: EThurnell Lose MD;  [facility];  Service: Gynecology;  Laterality: N/A;    Family History  Problem Relation Age of Onset   Asthma Neg Hx    Cancer Neg Hx    Diabetes Neg Hx    Hypertension Neg Hx    Heart disease Neg Hx    Stroke Neg Hx     Social History   Tobacco Use   Smoking status: Never   Smokeless tobacco: Never  Vaping Use   Vaping Use: Never used  Substance Use Topics   Alcohol use: No   Drug use: No    Allergies: No Known Allergies  Medications Prior to Admission  Medication Sig Dispense Refill Last Dose   Accu-Chek Softclix Lancets lancets To check blood sugars 4 times a day, fasting and 2 hours after first bite of breakfast, lunch and supper/dinner. 100 each 12    acetaminophen (TYLENOL) 325 MG tablet Take 650 mg by mouth every 6 (six) hours as needed.      Blood Glucose Monitoring Suppl (ACCU-CHEK GUIDE) w/Device KIT See admin instructions.      Blood Pressure Monitoring (BLOOD PRESSURE KIT) DEVI 1 Device by Does not apply route once a week. 1 each 0    calcium carbonate (TUMS -  DOSED IN MG ELEMENTAL CALCIUM) 500 MG chewable tablet Chew 1 tablet by mouth daily.      diclofenac Sodium (VOLTAREN) 1 % GEL Apply 4 grams topically 4 (four) times daily. 100 g 0    furosemide (LASIX) 20 MG tablet Take 1 tablet (20 mg total) by mouth daily. (Patient not taking: Reported on 11/10/2021) 5 tablet 0    glucose blood (ACCU-CHEK GUIDE) test strip To check blood sugars 4 times a day, fasting and 2 hours after the first bite of breakfast, lunch and dinner. 100 each 12    ibuprofen (ADVIL) 600 MG tablet Take 1 tablet (600 mg total) by mouth every 6 (six) hours. (Patient not taking: Reported on 03/10/2022) 30 tablet 0    metFORMIN (GLUCOPHAGE) 1000 MG tablet Take 1 tablet (1,000 mg total) by mouth 2 (two) times daily with a meal. 60 tablet 2    norethindrone (MICRONOR) 0.35 MG tablet Take 1 tablet (0.35 mg total) by  mouth daily. (Patient not taking: Reported on 03/31/2022) 28 tablet 12    Prenatal Vit-Fe Fumarate-FA (PRENATAL MULTIVITAMIN) TABS tablet Take 1 tablet by mouth daily at 12 noon. (Patient not taking: Reported on 03/10/2022)       Review of Systems  Gastrointestinal:  Positive for abdominal pain.  All other systems reviewed and are negative.  Physical Exam   Blood pressure 105/66, pulse 75, temperature 98.2 F (36.8 C), resp. rate 18, last menstrual period 03/17/2022, SpO2 100 %, currently breastfeeding.  Physical Exam Vitals and nursing note reviewed. Exam conducted with a chaperone present.  Constitutional:      Appearance: She is well-developed. She is not ill-appearing.  Cardiovascular:     Rate and Rhythm: Normal rate and regular rhythm.     Heart sounds: Normal heart sounds.  Pulmonary:     Effort: Pulmonary effort is normal.     Breath sounds: Normal breath sounds.  Abdominal:     Tenderness: There is generalized abdominal tenderness. There is no right CVA tenderness or left CVA tenderness.  Neurological:     Mental Status: She is alert.     MAU Course  Procedures  MDM  --1600: Dr Roselie Awkward in unit for assessment and discussion of plan of care.  At bedside a short time later --1705: Notified by Dr. Roselie Awkward that per his bedside assessment patient is stable, appropriate for Methotrexate and verbalizes preference for this method as initial intervention  Orders Placed This Encounter  Procedures   Urine Culture   Wet prep, genital   US OB LESS THAN 14 WEEKS WITH OB TRANSVAGINAL   Comprehensive metabolic panel   CBC with Differential   Urinalysis, Routine w reflex microscopic   hCG, quantitative, pregnancy   Diet NPO time specified   Nursing communication   I-Stat Beta hCG blood, ED ([facility], [facility], [facility] only)   Patient Vitals for the past 24 hrs:  BP Temp Pulse Resp SpO2  04/18/22 1246 105/66 98.2 F (36.8 C) 75 18 --  04/18/22 0951 (!) 149/99 98.2 F (36.8 C) 91 18 100 %   04/18/22 0950 -- -- -- -- 98 %   Results for orders placed or performed during the hospital encounter of 04/18/22 (from the past 24 hour(s))  Urinalysis, Routine w reflex microscopic Urine, Clean Catch     Status: Abnormal   Collection Time: 04/18/22  9:55 AM  Result Value Ref Range   Color, Urine YELLOW YELLOW   APPearance CLEAR CLEAR   Specific Gravity, Urine 1.019 1.005 -  1.030   pH 7.0 5.0 - 8.0   Glucose, UA NEGATIVE NEGATIVE mg/dL   Hgb urine dipstick MODERATE (A) NEGATIVE   Bilirubin Urine NEGATIVE NEGATIVE   Ketones, ur NEGATIVE NEGATIVE mg/dL   Protein, ur NEGATIVE NEGATIVE mg/dL   Nitrite NEGATIVE NEGATIVE   Leukocytes,Ua NEGATIVE NEGATIVE   RBC / HPF 11-20 0 - 5 RBC/hpf   WBC, UA 0-5 0 - 5 WBC/hpf   Bacteria, UA NONE SEEN NONE SEEN   Squamous Epithelial / LPF 0-5 0 - 5   Mucus PRESENT   Comprehensive metabolic panel     Status: Abnormal   Collection Time: 04/18/22  9:59 AM  Result Value Ref Range   Sodium 136 135 - 145 mmol/L   Potassium 3.6 3.5 - 5.1 mmol/L   Chloride 105 98 - 111 mmol/L   CO2 23 22 - 32 mmol/L   Glucose, Bld 106 (H) 70 - 99 mg/dL   BUN 11 6 - 20 mg/dL   Creatinine, Ser 0.63 0.44 - 1.00 mg/dL   Calcium 8.9 8.9 - 10.3 mg/dL   Total Protein 6.8 6.5 - 8.1 g/dL   Albumin 3.6 3.5 - 5.0 g/dL   AST 21 15 - 41 U/L   ALT 18 0 - 44 U/L   Alkaline Phosphatase 80 38 - 126 U/L   Total Bilirubin 0.7 0.3 - 1.2 mg/dL   GFR, Estimated >60 >60 mL/min   Anion gap 8 5 - 15  CBC with Differential     Status: None   Collection Time: 04/18/22  9:59 AM  Result Value Ref Range   WBC 8.0 4.0 - 10.5 K/uL   RBC 4.47 3.87 - 5.11 MIL/uL   Hemoglobin 12.8 12.0 - 15.0 g/dL   HCT 37.4 36.0 - 46.0 %   MCV 83.7 80.0 - 100.0 fL   MCH 28.6 26.0 - 34.0 pg   MCHC 34.2 30.0 - 36.0 g/dL   RDW 13.0 11.5 - 15.5 %   Platelets 232 150 - 400 K/uL   nRBC 0.0 0.0 - 0.2 %   Neutrophils Relative % 43 %   Neutro Abs 3.4 1.7 - 7.7 K/uL   Lymphocytes Relative 43 %   Lymphs Abs 3.5  0.7 - 4.0 K/uL   Monocytes Relative 11 %   Monocytes Absolute 0.9 0.1 - 1.0 K/uL   Eosinophils Relative 2 %   Eosinophils Absolute 0.2 0.0 - 0.5 K/uL   Basophils Relative 1 %   Basophils Absolute 0.0 0.0 - 0.1 K/uL   Immature Granulocytes 0 %   Abs Immature Granulocytes 0.03 0.00 - 0.07 K/uL  I-Stat Beta hCG blood, ED ([facility], [facility], [facility] only)     Status: Abnormal   Collection Time: 04/18/22 10:55 AM  Result Value Ref Range   I-stat hCG, quantitative >2,000.0 (H) <5 mIU/mL   Comment 3          Wet prep, genital     Status: None   Collection Time: 04/18/22  1:01 PM   Specimen: Vaginal  Result Value Ref Range   Yeast Wet Prep HPF POC NONE SEEN NONE SEEN   Trich, Wet Prep NONE SEEN NONE SEEN   Clue Cells Wet Prep HPF POC NONE SEEN NONE SEEN   WBC, Wet Prep HPF POC <10 <10   Sperm NONE SEEN    US OB LESS THAN 14 WEEKS WITH OB TRANSVAGINAL  Result Date: 04/18/2022 CLINICAL DATA:  Left-sided pelvic pain.  First trimester pregnancy. EXAM: OBSTETRIC <14 WK [place]  AND TRANSVAGINAL OB US TECHNIQUE: Both transabdominal and transvaginal ultrasound examinations were performed for complete evaluation of the gestation as well as the maternal uterus, adnexal regions, and pelvic cul-de-sac. Transvaginal technique was performed to assess early pregnancy. COMPARISON:  None Available. FINDINGS: Intrauterine gestational sac: None Maternal uterus/adnexae: Left ovary is normal in appearance. A complex hypoechoic mass is seen adjacent to the left ovary which appears separate and measures 3.3 x 2.3 x 4.3 cm. This is highly suspicious for an ectopic pregnancy. A small amount of free fluid with internal echoes is also seen in the cul-de-sac, suspicious for hemoperitoneum. A mass is also seen in the right adnexa which contains several nodular hyperechoic areas centrally. This measures 4.5 x 2.9 x 3.9 cm and is suspicious for a right ovarian dermoid. IMPRESSION: 4.3 cm left adnexal mass and small amount of complex free fluid,  highly suspicious for ruptured ectopic pregnancy. 4.5 cm right adnexal mass, highly suspicious for right ovarian dermoid. Critical Value/emergent results were called by telephone at the time of interpretation on 04/18/2022 at 3:34 pm to provider [facility] , who verbally acknowledged these results. Electronically Signed   By: Marlaine Hind M.D.   On: 04/18/2022 15:38    Methotrexate Treatment Protocol for Ectopic Pregnancy  Pretreatment testing and instructions  hCG concentration  Transvaginal ultrasound  Blood group and Rh(D) typing; give Rhogam 300 mcg IM, if indicated  Complete blood count  Liver and renal function tests  Discontinue folic acid supplements  Counsel patient to avoid NSAIDs, recommend acetaminophen if an analgesic is needed  Advise patient to refrain from sexual intercourse and strenuous exercise   Treatment day  Single dose protocol   1 Sat 12/16 hCG.  Administer Methotrexate 50 mg/m2 body surface area IM  4 Tues 12/19 hCG  7 Fri 12/22 hCG  If <15 percent hCG decline from day 4 to 7, give additional dose of methotrexate 50 mg/m2 IM  If ?15 percent hCG decline from day 4 to 7, draw hCG weekly until undetectable    The hCG concentration usually declines to less than 15 mIU/mL by 35 days postinjection but may take as long as 109 days. If the hCG does not decline to zero, a new pregnancy should be excluded; if the hCG is rising, a transvaginal ultrasound should be performed. Alternatively, some patients have a slow clearance of serum hCG. If three weekly values are similar, consider an additional dose of MTX (50 mg/m2) not to exceed the recommended maximum of three total doses. This typically accelerates the decline of serum hCG. The risk of gestational trophoblastic disease is low. Folinic acid rescue is not required for women treated with the single-dose protocol, even if multiple doses are ultimately given.   Prepared with data from:   [facility]. Clinical practice.  Ectopic pregnancy. [place] Med 2009; 361:379  American College of Obstetricians and Gynecologists. ACOG Practice Bulletin No. 94: Medical management of ectopic pregnancy. Obstet Gynecol 2008; 343:7357.  Assessment and Plan  --36 y.o. G4P3003 at [redacted]w[redacted]d --LEFT ectopic pregnancy, VSS --Pain well-managed with Vicodin given in [facility]  --Dr. ARoselie Awkwardat bedside, care assumed of patient at 1600 --S/p Methotrexate administration --Discharge home in stable condition with strict return precautions  F/U: --[facility]  SDarlina Rumpf MMiranda MSN, CNM 04/18/2022, 7:34 PM

## 2022-04-18 NOTE — ED Provider Triage Note (Signed)
Emergency Medicine Provider Triage Evaluation Note  Tami Crawford , a 36 y.o. female  was evaluated in triage.  Pt complains of left-sided abdominal pain.  Patient reports history of 3 days of progressively worsening symptoms.  States the pain initially responded to Tylenol but has since been unresponsive to start medication.  Reports left lower quadrant abdominal pain with radiation to left flank.  Reports being on her menstrual cycle currently day 6 but denies concurrent vaginal discharge.  Denies fever, chills, night sweats..  Review of Systems  Positive: See above Negative:   Physical Exam  BP (!) 149/99   Pulse 91   Temp 98.2 F (36.8 C)   Resp 18   SpO2 100%  Gen:   Awake, no distress   Resp:  Normal effort  MSK:   Moves extremities without difficulty  Other:  Left-sided CVA tenderness.  Left lower quadrant tenderness to palpation.  Medical Decision Making  Medically screening exam initiated at 9:54 AM.  Appropriate orders placed.  Tami Crawford was informed that the remainder of the evaluation will be completed by another provider, this initial triage assessment does not replace that evaluation, and the importance of remaining in the ED until their evaluation is complete.     Tami Crawford, [place] 04/18/22 [phone]

## 2022-04-18 NOTE — MAU Note (Signed)
What to expect (cramping and bleeding),side effects.  Reasons to return, increased pain, heavy bleeding (severe pain, more than a pad an hour,bleeding or pain w/dizziness, fever about 100.4). Stressed importance of follow up appointments. Engineer, maintenance (IT) used for the above).

## 2022-04-18 NOTE — ED Triage Notes (Signed)
Pt BIB [facility] from home c/o LLQ abdominal pain and left flank pain that started a couple of days ago. Pt endorses nausea but no vomiting. Pt is tender with palpation but not distended.

## 2022-04-19 LAB — URINE CULTURE: Culture: 20000 — AB

## 2022-04-20 ENCOUNTER — Telehealth: Payer: Self-pay

## 2022-04-20 ENCOUNTER — Other Ambulatory Visit (HOSPITAL_BASED_OUTPATIENT_CLINIC_OR_DEPARTMENT_OTHER): Payer: Self-pay

## 2022-04-20 LAB — GC/CHLAMYDIA PROBE AMP (~~LOC~~) NOT AT ARMC
Chlamydia: NEGATIVE
Comment: NEGATIVE
Comment: NORMAL
Neisseria Gonorrhea: NEGATIVE

## 2022-04-20 NOTE — Telephone Encounter (Addendum)
Patient left VM on nurse line stating "I am not feeling good." Pt has concerns regarding recent visit to hospital and medication sent to pharmacy.  Returned call to patient who states she is having light vaginal bleeding, dizziness, and abdominal pain. She rates the pain 4/10 after taking 2 Tylenol. Temp this AM was 99.6. Reviewed with Jeronimo Greaves, CNM who recommends pt keep nurse visit for tomorrow morning at 0830. Pt to present to MAU for any severe pain or temp over 100.3 F.

## 2022-04-21 ENCOUNTER — Ambulatory Visit (INDEPENDENT_AMBULATORY_CARE_PROVIDER_SITE_OTHER): Payer: Medicaid Other

## 2022-04-21 VITALS — BP 100/68 | HR 91 | Temp 98.4°F | Wt 226.8 lb

## 2022-04-21 DIAGNOSIS — O00202 Left ovarian pregnancy without intrauterine pregnancy: Secondary | ICD-10-CM

## 2022-04-21 DIAGNOSIS — Z3A00 Weeks of gestation of pregnancy not specified: Secondary | ICD-10-CM

## 2022-04-21 LAB — BETA HCG QUANT (REF LAB): hCG Quant: 1278 m[IU]/mL

## 2022-04-21 NOTE — Progress Notes (Signed)
Beta HCG Follow-up Visit  Tami Crawford presents to [place] for follow-up beta HCG lab. She was seen in MAU on 04/18/22 and given methotrexate for ectopic pregnancy. Patient reports light vaginal bleeding today and abdominal pain 4/10. Pain is improved with Tylenol. Discussed with patient that we are following beta HCG levels today. I will call the patient with results.   Patient was breastfeeding prior to MTX inj and was told to d/c following MTX. Pt desires continuation of breastfeeding when able. Recommended patient pump or hand express and waste breast milk. Will review with provider when it is safe to resume and give pt this information during phone call.   Beta HCG results: 04/18/22 @ 1011 DAY 1 4,457  04/21/22 @ 0903 DAY 4 1,278   Results and patient history reviewed with Emeterio Reeve, MD, who states patient should follow up for DAY 7 HCG on Friday, 04/24/22. Provider states patient should wait 1 week from time of injection to resume breastfeeding. Patient called; VM left stating I am calling with results and will call a second time. Called pt a second time; results and provider recommendation reviewed.  Annabell Howells 04/21/2022 8:43 AM

## 2022-04-24 ENCOUNTER — Ambulatory Visit (INDEPENDENT_AMBULATORY_CARE_PROVIDER_SITE_OTHER): Payer: Medicaid Other | Admitting: *Deleted

## 2022-04-24 ENCOUNTER — Other Ambulatory Visit: Payer: Self-pay

## 2022-04-24 VITALS — BP 118/69 | HR 91 | Ht 68.0 in | Wt 225.4 lb

## 2022-04-24 DIAGNOSIS — R300 Dysuria: Secondary | ICD-10-CM

## 2022-04-24 DIAGNOSIS — O00202 Left ovarian pregnancy without intrauterine pregnancy: Secondary | ICD-10-CM | POA: Diagnosis not present

## 2022-04-24 DIAGNOSIS — Z3A00 Weeks of gestation of pregnancy not specified: Secondary | ICD-10-CM

## 2022-04-24 LAB — POCT URINALYSIS DIP (DEVICE)
Bilirubin Urine: NEGATIVE
Glucose, UA: NEGATIVE mg/dL
Ketones, ur: NEGATIVE mg/dL
Leukocytes,Ua: NEGATIVE
Nitrite: NEGATIVE
Protein, ur: NEGATIVE mg/dL
Specific Gravity, Urine: 1.02 (ref 1.005–1.030)
Urobilinogen, UA: 0.2 mg/dL (ref 0.0–1.0)
pH: 6 (ref 5.0–8.0)

## 2022-04-24 LAB — BETA HCG QUANT (REF LAB): hCG Quant: 609 m[IU]/mL

## 2022-04-24 NOTE — Progress Notes (Signed)
Here for day 7 after methotrexate. Declines interpreter.  C/o small amount bleeding only, c/o small pain , less than before but states has pain when goes to bathroom to urinate or have bowel movement on left side to back. Clean catch urine obtained. I explained Tami Crawford it is UTI, but I will send for culture. Explained I will call in a few hours with results and plan of care. She voices understanding.  Staci Acosta Received bhcg results= 609. Discussed assessment, results with Dr. Damita Dunnings. Instructed appropriate decrease in bhcg, needs repeat bhcg in one week.  Called Tami Crawford and left message I am calling with results and since we close at 12 noon today until Wednesday , I will let you know results are good and I am scheduling you for another lab in one week- check MyChart. Call if questions before 12 today.  Scheduled repeat non stat bhcg for 05/01/22 11:00. Staci Acosta

## 2022-04-28 LAB — URINE CULTURE

## 2022-04-29 ENCOUNTER — Other Ambulatory Visit: Payer: Self-pay

## 2022-04-29 ENCOUNTER — Telehealth: Payer: Self-pay

## 2022-04-29 ENCOUNTER — Encounter: Payer: Self-pay | Admitting: Obstetrics and Gynecology

## 2022-04-29 DIAGNOSIS — B951 Streptococcus, group B, as the cause of diseases classified elsewhere: Secondary | ICD-10-CM | POA: Insufficient documentation

## 2022-04-29 NOTE — Telephone Encounter (Signed)
-----   Message from Radene Gunning, MD sent at 04/29/2022  1:33 PM EST ----- Please inform pt of GBS pos in urine. Colony count is low so treatment is not indicated especially since ectopic pregnancy.  Thanks, pad

## 2022-04-30 ENCOUNTER — Other Ambulatory Visit: Payer: Medicaid Other

## 2022-04-30 ENCOUNTER — Other Ambulatory Visit: Payer: Self-pay

## 2022-04-30 DIAGNOSIS — O00202 Left ovarian pregnancy without intrauterine pregnancy: Secondary | ICD-10-CM

## 2022-05-01 ENCOUNTER — Other Ambulatory Visit: Payer: Medicaid Other

## 2022-05-01 LAB — BETA HCG QUANT (REF LAB): hCG Quant: 276 m[IU]/mL

## 2022-05-05 ENCOUNTER — Telehealth: Payer: Self-pay

## 2022-05-05 DIAGNOSIS — O00202 Left ovarian pregnancy without intrauterine pregnancy: Secondary | ICD-10-CM

## 2022-05-05 NOTE — Telephone Encounter (Signed)
Called patient using [employer] [phone]. Received VM. Left VM that I was calling with results. Requested pt call back and also stated we would attempt to call at a later time.

## 2022-05-05 NOTE — Telephone Encounter (Signed)
-----   Message from Radene Gunning, MD sent at 05/01/2022  1:07 PM EST ----- Pt is s/p MTX and dropping well. Recheck in one week her HCG level and continue weekly until 0.  Thanks, pad

## 2022-05-06 ENCOUNTER — Telehealth: Payer: Self-pay | Admitting: Family Medicine

## 2022-05-06 NOTE — Telephone Encounter (Signed)
Patient calling back to get lab results.

## 2022-05-07 ENCOUNTER — Other Ambulatory Visit: Payer: Self-pay | Admitting: Internal Medicine

## 2022-05-07 NOTE — Telephone Encounter (Signed)
Called pt; results reviewed and appt made.

## 2022-05-07 NOTE — Telephone Encounter (Signed)
Addressed in separate encounter.

## 2022-05-07 NOTE — Addendum Note (Signed)
Addended by: Louisa Second E on: 05/07/2022 03:12 PM   Modules accepted: Orders

## 2022-05-07 NOTE — Telephone Encounter (Signed)
Pt returned phone call. Called pt back; left VM requesting call back to schedule appt as soon as today or tomorrow.

## 2022-05-08 LAB — COMPLETE METABOLIC PANEL WITH GFR
AG Ratio: 1.5 (calc) (ref 1.0–2.5)
ALT: 16 U/L (ref 6–29)
AST: 16 U/L (ref 10–30)
Albumin: 4.3 g/dL (ref 3.6–5.1)
Alkaline phosphatase (APISO): 83 U/L (ref 31–125)
BUN: 8 mg/dL (ref 7–25)
CO2: 21 mmol/L (ref 20–32)
Calcium: 9.5 mg/dL (ref 8.6–10.2)
Chloride: 106 mmol/L (ref 98–110)
Creat: 0.52 mg/dL (ref 0.50–0.97)
Globulin: 2.9 g/dL (calc) (ref 1.9–3.7)
Glucose, Bld: 76 mg/dL (ref 65–99)
Potassium: 4.2 mmol/L (ref 3.5–5.3)
Sodium: 138 mmol/L (ref 135–146)
Total Bilirubin: 0.6 mg/dL (ref 0.2–1.2)
Total Protein: 7.2 g/dL (ref 6.1–8.1)
eGFR: 125 mL/min/{1.73_m2} (ref >=60)

## 2022-05-08 LAB — TSH: TSH: 0.38 mIU/L — ABNORMAL LOW

## 2022-05-08 LAB — CBC
HCT: 37.7 % (ref 35.0–45.0)
Hemoglobin: 12.8 g/dL (ref 11.7–15.5)
MCH: 27.9 pg (ref 27.0–33.0)
MCHC: 34 g/dL (ref 32.0–36.0)
MCV: 82.3 fL (ref 80.0–100.0)
MPV: 10.9 fL (ref 7.5–12.5)
Platelets: 304 10*3/uL (ref 140–400)
RBC: 4.58 10*6/uL (ref 3.80–5.10)
RDW: 13.1 % (ref 11.0–15.0)
WBC: 5.4 10*3/uL (ref 3.8–10.8)

## 2022-05-08 LAB — LIPID PANEL
Cholesterol: 195 mg/dL (ref <200)
HDL: 36 mg/dL — ABNORMAL LOW (ref >=50)
LDL Cholesterol (Calc): 134 mg/dL (calc) — ABNORMAL HIGH
Non-HDL Cholesterol (Calc): 159 mg/dL (calc) — ABNORMAL HIGH (ref <130)
Total CHOL/HDL Ratio: 5.4 (calc) — ABNORMAL HIGH (ref <5.0)
Triglycerides: 133 mg/dL (ref <150)

## 2022-05-08 LAB — VITAMIN D 25 HYDROXY (VIT D DEFICIENCY, FRACTURES): Vit D, 25-Hydroxy: 31 ng/mL (ref 30–100)

## 2022-05-11 ENCOUNTER — Other Ambulatory Visit: Payer: Medicaid Other

## 2022-05-11 ENCOUNTER — Other Ambulatory Visit: Payer: Self-pay

## 2022-05-11 DIAGNOSIS — O00202 Left ovarian pregnancy without intrauterine pregnancy: Secondary | ICD-10-CM

## 2022-05-12 ENCOUNTER — Telehealth: Payer: Self-pay

## 2022-05-12 LAB — BETA HCG QUANT (REF LAB): hCG Quant: 57 m[IU]/mL

## 2022-05-12 NOTE — Telephone Encounter (Signed)
Patient returned call to front office. Informed patient of results and recommended follow up appts. Transferred call to front office for scheduling.

## 2022-05-12 NOTE — Telephone Encounter (Signed)
-----   Message from Radene Gunning, MD sent at 05/12/2022  8:29 AM EST ----- HCG continues to drop appropriately s/p MTX - please bring back in one week for next level. Does she have a follow  up appointment made with someone to discuss possible birth control to follow? Thanks, pad

## 2022-05-12 NOTE — Telephone Encounter (Signed)
Called patient using [employer] [phone]. Left VM I was calling with results and would attempt to call at a later time.

## 2022-05-19 ENCOUNTER — Ambulatory Visit: Payer: Medicaid Other | Admitting: Advanced Practice Midwife

## 2022-05-19 ENCOUNTER — Other Ambulatory Visit: Payer: Self-pay

## 2022-05-19 DIAGNOSIS — O00202 Left ovarian pregnancy without intrauterine pregnancy: Secondary | ICD-10-CM

## 2022-05-20 ENCOUNTER — Encounter: Payer: Self-pay | Admitting: Family Medicine

## 2022-05-20 ENCOUNTER — Ambulatory Visit: Payer: Medicaid Other | Admitting: Obstetrics & Gynecology

## 2022-05-20 ENCOUNTER — Other Ambulatory Visit: Payer: Medicaid Other

## 2022-05-20 ENCOUNTER — Ambulatory Visit (INDEPENDENT_AMBULATORY_CARE_PROVIDER_SITE_OTHER): Payer: Medicaid Other | Admitting: Family Medicine

## 2022-05-20 VITALS — BP 101/58 | HR 93 | Wt 225.0 lb

## 2022-05-20 DIAGNOSIS — Z3A09 9 weeks gestation of pregnancy: Secondary | ICD-10-CM

## 2022-05-20 DIAGNOSIS — D270 Benign neoplasm of right ovary: Secondary | ICD-10-CM | POA: Diagnosis not present

## 2022-05-20 DIAGNOSIS — O00102 Left tubal pregnancy without intrauterine pregnancy: Secondary | ICD-10-CM

## 2022-05-20 DIAGNOSIS — O00202 Left ovarian pregnancy without intrauterine pregnancy: Secondary | ICD-10-CM

## 2022-05-20 MED ORDER — NORGESTIMATE-ETH ESTRADIOL 0.25-35 MG-MCG PO TABS: 1.0000 | ORAL_TABLET | Freq: Every day | ORAL | 5 refills | Status: DC

## 2022-05-20 NOTE — Progress Notes (Signed)
   Ectopic follow up  Subjective:   Tami Crawford is a 37 y.o. [phone] female here for  F/u after ectopic with administration of MXT of 69w1dpregnancy. Denies continued bleeding/ cramping. Reports emotionally doing well.    The following portions of the patient's history were reviewed and updated as appropriate: allergies, current medications, past family history, past medical history, past social history, past surgical history and problem list.  Review of Systems Pertinent items are noted in HPI.   Objective:  BP (!) 101/58   Pulse 93   Wt 225 lb (102.1 kg)   LMP 03/17/2022   Breastfeeding No   BMI 34.21 kg/m  Gen: well appearing, NAD HEENT: no scleral icterus CV: RR Lung: Normal WOB Ext: warm well perfused  Lab Results  Component Value Date   ABORH O POS 04/18/2022   bHCG QUANT  Lab Results  Component Value Date   HCGQUANT 57 05/11/2022   HCGQUANT 276 04/30/2022   HCGQUANT 609 04/24/2022   HCGQUANT 1,278 04/21/2022     Assessment and Plan:  1. Ectopic pregnancy -Last BCG was 540 needs to be followed to zero given ectopic.  - BHCG drawn today - reviewed preconception health  - reviewed contraception and basic fertility. Patient desires pregnancy within 12 months and will use oral contraceptives (estrogen/progesterone) - provided resources and support regarding pregnancy loss.  - [facility]   Please refer to After Visit Summary for other counseling recommendations.   Return in about 4 weeks (around 06/17/2022) for Follow up Fatigue and BP check after starting OCP (Laylanie Kruczek if possible).

## 2022-05-20 NOTE — Patient Instructions (Signed)
   We recommended establishing primary care so you have a doctor after delivery of your baby to help make sure you remain healthy and have a place to be seen for any chronic conditions or if you get sick. Tami Crawford has a way for patients to sign up online for a primary care visit.   There are options for Family Medicine- doctors that can see your whole family Internal Medicine- doctors that can see only people > 35 years old   Here is the website: [email or web address]

## 2022-05-21 LAB — BETA HCG QUANT (REF LAB): hCG Quant: 9 m[IU]/mL

## 2022-05-21 NOTE — Telephone Encounter (Signed)
Pt notified.    Tami Crawford

## 2022-05-26 ENCOUNTER — Telehealth: Payer: Self-pay

## 2022-05-26 DIAGNOSIS — O00202 Left ovarian pregnancy without intrauterine pregnancy: Secondary | ICD-10-CM

## 2022-05-26 NOTE — Telephone Encounter (Addendum)
-----  Message from Caren Macadam, MD sent at 05/22/2022 12:44 PM EST ----- BHCG has dropped but is not negative. Has pregnancy of unknown location.  Needs repeat in 2 weeks to follow to zero.   Called pt and results reviewed. Lab appt scheduled for 06/03/22.

## 2022-06-03 ENCOUNTER — Other Ambulatory Visit: Payer: Self-pay

## 2022-06-03 ENCOUNTER — Other Ambulatory Visit: Payer: Medicaid Other

## 2022-06-03 DIAGNOSIS — O00202 Left ovarian pregnancy without intrauterine pregnancy: Secondary | ICD-10-CM

## 2022-06-04 ENCOUNTER — Telehealth: Payer: Self-pay

## 2022-06-04 LAB — BETA HCG QUANT (REF LAB): hCG Quant: <1 m[IU]/mL

## 2022-06-04 NOTE — Telephone Encounter (Incomplete Revision)
-----  Message from Caren Macadam, MD sent at 06/04/2022  9:39 AM EST ----- S/p MXT for pregnancy of unknown location. BHCG is now zero. She can continue to take her birth control pills. She should have an appointment to check her BP on 2/14  Contacted pt with [place] # [phone] and left message that I am calling with results if she could give the office a call back.    Mel Almond, RN  06/04/22

## 2022-06-04 NOTE — Telephone Encounter (Addendum)
-----   Message from Caren Macadam, MD sent at 06/04/2022  9:39 AM EST ----- S/p MXT for pregnancy of unknown location. BHCG is now zero. She can continue to take her birth control pills. She should have an appointment to check her BP on 2/14  Contacted pt with [place] # [phone] and left message that I am calling with results if she could give the office a call back.    Mel Almond, RN  06/04/22

## 2022-06-16 NOTE — Telephone Encounter (Addendum)
Tried contacting patient at number listed in [place] interpreter [phone] to discuss results and next steps from Dr. Ernestina Patches. Was sent to voicemail and left message to call patient back so we can discuss the results.   Since this is the second time calling the patient, I will send a MyChart message since she is set up with MyChart.   Adonis Huguenin RN 06/16/22 at 1157  Message sent via [place]. Adonis Huguenin RN 06/16/22 at [phone]

## 2022-06-17 ENCOUNTER — Ambulatory Visit: Payer: Medicaid Other | Admitting: Family Medicine

## 2022-06-25 ENCOUNTER — Ambulatory Visit (INDEPENDENT_AMBULATORY_CARE_PROVIDER_SITE_OTHER): Payer: Medicaid Other | Admitting: Family Medicine

## 2022-06-25 ENCOUNTER — Encounter: Payer: Self-pay | Admitting: Family Medicine

## 2022-06-25 ENCOUNTER — Other Ambulatory Visit: Payer: Self-pay

## 2022-06-25 VITALS — BP 125/79 | HR 93 | Wt 226.1 lb

## 2022-06-25 DIAGNOSIS — Z3A01 Less than 8 weeks gestation of pregnancy: Secondary | ICD-10-CM

## 2022-06-25 DIAGNOSIS — O00102 Left tubal pregnancy without intrauterine pregnancy: Secondary | ICD-10-CM | POA: Diagnosis not present

## 2022-06-25 NOTE — Progress Notes (Signed)
   GYNECOLOGY OFFICE VISIT NOTE  History:   Tami Crawford is a 37 y.o. [phone] here today for ectopic follow up.  Patient had L ectopic diagnosed in 04/2022 and received methotrexate HCG trended and was normal as of 06/03/2022 Last seen by Dr. Ernestina Patches on 05/20/2022, given OCP's at that time and scheduled for follow up  Today reports she is almost done with her period Has been taking OCP's Does not have any questions Does not desire pregnancy until at least next year  Health Maintenance Due  Topic Date Due   COVID-19 Vaccine (1) Never done   FOOT EXAM  Never done   OPHTHALMOLOGY EXAM  Never done   Diabetic kidney evaluation - Urine ACR  04/23/2022    Past Medical History:  Diagnosis Date   Gestational diabetes    Headache     Past Surgical History:  Procedure Laterality Date   PERINEAL LACERATION REPAIR N/A 04/28/2013   Procedure: SUTURE REPAIR PERINEAL LACERATION;  Surgeon: Thurnell Lose, MD;  Location: [facility];  Service: Gynecology;  Laterality: N/A;    The following portions of the patient's history were reviewed and updated as appropriate: allergies, current medications, past family history, past medical history, past social history, past surgical history and problem list.   Health Maintenance:   Last pap: Lab Results  Component Value Date   DIAGPAP  04/23/2021    - Negative for intraepithelial lesion or malignancy (NILM)   [place] Negative 04/23/2021     Last mammogram:  N/a    Review of Systems:  Pertinent items noted in HPI and remainder of comprehensive ROS otherwise negative.  Physical Exam:  BP 125/79   Pulse 93   Wt 226 lb 1.6 oz (102.6 kg)   BMI 34.38 kg/m  CONSTITUTIONAL: Well-developed, well-nourished female in no acute distress.  HEENT:  Normocephalic, atraumatic. External right and left ear normal. No scleral icterus.  NECK: Normal range of motion, supple, no masses noted on observation SKIN: No rash noted. Not diaphoretic. No erythema.  No pallor. MUSCULOSKELETAL: Normal range of motion. No edema noted. NEUROLOGIC: Alert and oriented to person, place, and time. Normal muscle tone coordination.  PSYCHIATRIC: Normal mood and affect. Normal behavior. Normal judgment and thought content. RESPIRATORY: Effort normal, no problems with respiration noted   Labs and Imaging No results found for this or any previous visit (from the past 168 hour(s)). No results found.    Assessment and Plan:   Problem List Items Addressed This Visit       Other   Ectopic pregnancy - Primary    HCG already trended to normal. Discussed recommendation to delay pregnancy at least 3-6 months, she reports no desire for pregnancy until next year. Also desires to run packs together to not have menses, seemed unaware that last row are placebo, described how to do this in detail to patient. All questions answered.        Routine preventative health maintenance measures emphasized. Please refer to After Visit Summary for other counseling recommendations.   Return if symptoms worsen or fail to improve.    Total face-to-face time with patient: 15 minutes.  Over 50% of encounter was spent on counseling and coordination of care.   Clarnce Flock, MD/MPH Attending Family Medicine Physician, [facility] for [facility], [place]

## 2022-06-25 NOTE — Assessment & Plan Note (Signed)
HCG already trended to normal. Discussed recommendation to delay pregnancy at least 3-6 months, she reports no desire for pregnancy until next year. Also desires to run packs together to not have menses, seemed unaware that last row are placebo, described how to do this in detail to patient. All questions answered.

## 2022-07-21 ENCOUNTER — Telehealth: Payer: Self-pay | Admitting: Orthopaedic Surgery

## 2022-07-21 NOTE — Telephone Encounter (Signed)
Patient called. She says she needs a refill on the cream she has. She does not know the name.

## 2022-07-22 ENCOUNTER — Other Ambulatory Visit (HOSPITAL_BASED_OUTPATIENT_CLINIC_OR_DEPARTMENT_OTHER): Payer: Self-pay | Admitting: Orthopaedic Surgery

## 2022-07-22 MED ORDER — DICLOFENAC SODIUM 1 % EX GEL: 4.0000 g | Freq: Four times a day (QID) | CUTANEOUS | 3 refills | Status: DC

## 2022-07-27 ENCOUNTER — Other Ambulatory Visit (HOSPITAL_BASED_OUTPATIENT_CLINIC_OR_DEPARTMENT_OTHER): Payer: Self-pay

## 2022-07-27 ENCOUNTER — Telehealth: Payer: Self-pay | Admitting: Orthopaedic Surgery

## 2022-07-27 NOTE — Telephone Encounter (Signed)
Pt called stating her leg is in severe leg pain and need an appt right away. Please call pt at [phone].

## 2022-07-28 ENCOUNTER — Other Ambulatory Visit (HOSPITAL_BASED_OUTPATIENT_CLINIC_OR_DEPARTMENT_OTHER): Payer: Self-pay | Admitting: Student

## 2022-07-28 ENCOUNTER — Ambulatory Visit (INDEPENDENT_AMBULATORY_CARE_PROVIDER_SITE_OTHER): Payer: Medicaid Other | Admitting: Student

## 2022-07-28 ENCOUNTER — Encounter (HOSPITAL_BASED_OUTPATIENT_CLINIC_OR_DEPARTMENT_OTHER): Payer: Self-pay | Admitting: Student

## 2022-07-28 ENCOUNTER — Ambulatory Visit (HOSPITAL_BASED_OUTPATIENT_CLINIC_OR_DEPARTMENT_OTHER): Payer: Medicaid Other | Admitting: Student

## 2022-07-28 ENCOUNTER — Other Ambulatory Visit (HOSPITAL_BASED_OUTPATIENT_CLINIC_OR_DEPARTMENT_OTHER): Payer: Self-pay

## 2022-07-28 ENCOUNTER — Ambulatory Visit (INDEPENDENT_AMBULATORY_CARE_PROVIDER_SITE_OTHER): Payer: Medicaid Other

## 2022-07-28 VITALS — Ht 68.0 in | Wt 224.0 lb

## 2022-07-28 DIAGNOSIS — M25561 Pain in right knee: Secondary | ICD-10-CM

## 2022-07-28 MED ORDER — MELOXICAM 15 MG PO TABS
15.0000 mg | ORAL_TABLET | Freq: Every day | ORAL | 0 refills | Status: AC
  Filled 2022-07-28: qty 14, 14d supply, fill #0

## 2022-07-28 NOTE — Progress Notes (Signed)
Chief Complaint: Right knee pain     History of Present Illness:    Tami Crawford is a 37 y.o. female presents with atraumatic right knee pain that has been worse since Thursday of last week.  She says that she has noticed significant swelling along with increased pain levels.  Her knee also catches occasionally.  She has been using Voltaren gel which has not been helping.  She does intermittently take Advil which has been of some benefit.  She says that her range of motion has been limited due to the swelling in her knee.  She was seen in our clinic in November of last year for IT band syndrome, however she says that this pain feels different.  Denies fever or chills.   Surgical History:   None  PMH/PSH/Family History/Social History/Meds/Allergies:    Past Medical History:  Diagnosis Date   Gestational diabetes    Headache    Past Surgical History:  Procedure Laterality Date   PERINEAL LACERATION REPAIR N/A 04/28/2013   Procedure: SUTURE REPAIR PERINEAL LACERATION;  Surgeon: Thurnell Lose, MD;  Location: [facility];  Service: Gynecology;  Laterality: N/A;   Social History   Socioeconomic History   Marital status: Married    Spouse name: Not on file   Number of children: Not on file   Years of education: Not on file   Highest education level: Not on file  Occupational History   Not on file  Tobacco Use   Smoking status: Never   Smokeless tobacco: Never  Vaping Use   Vaping Use: Never used  Substance and Sexual Activity   Alcohol use: No   Drug use: No   Sexual activity: Yes  Other Topics Concern   Not on file  Social History Narrative   Not on file   Social Determinants of Health   Financial Resource Strain: Not on file  Food Insecurity: No Food Insecurity (05/19/2021)   Hunger Vital Sign    Worried About Running Out of Food in the Last Year: Never true    Ran Out of Food in the Last Year: Never true  Transportation Needs:  No Transportation Needs (05/19/2021)   PRAPARE - Hydrologist (Medical): No    Lack of Transportation (Non-Medical): No  Physical Activity: Not on file  Stress: Not on file  Social Connections: Not on file   Family History  Problem Relation Age of Onset   Healthy Mother    Other Father        sick a few days before he died, unknown cause   Asthma Neg Hx    Cancer Neg Hx    Diabetes Neg Hx    Hypertension Neg Hx    Heart disease Neg Hx    Stroke Neg Hx    No Known Allergies Current Outpatient Medications  Medication Sig Dispense Refill   meloxicam (MOBIC) 15 MG tablet Take 1 tablet (15 mg total) by mouth daily for 14 days. 14 tablet 0   acetaminophen (TYLENOL) 325 MG tablet Take 650 mg by mouth every 6 (six) hours as needed.     diclofenac Sodium (VOLTAREN) 1 % GEL Apply 4 g topically 4 (four) times daily. 50 g 3   norgestimate-ethinyl estradiol (ORTHO-CYCLEN) 0.25-35 MG-MCG tablet Take 1 tablet by mouth daily.  84 tablet 5   No current facility-administered medications for this visit.   No results found.  Review of Systems:   A ROS was performed including pertinent positives and negatives as documented in the HPI.  Physical Exam :   Constitutional: NAD and appears stated age Neurological: Alert and oriented Psych: Appropriate affect and cooperative Height 5\' 8"  (1.727 m), weight 224 lb (101.6 kg), not currently breastfeeding.   Comprehensive Musculoskeletal Exam:    Tenderness to palpation along lateral joint line and around patella.  No erythema or warmth. Able to actively extend to about 10 degrees, however flexion limited to approximately 60 degrees due to pain.  Mild tenderness around lateral IT band.  Soft tissue edema throughout the knee joint with possible mild to moderate effusion.  Negative Lachman.  Knee joint stable with varus/valgus stress.   Imaging:   Xray (4 views right knee): Lateral tracking of patella suggesting effusion.   Otherwise normal knee x-ray   I personally reviewed and interpreted the radiographs.   Assessment:   37 y.o. female with increased swelling and pain in her right knee without injury.  Her x-rays suggest a possible joint effusion.  I did offer a steroid injection which the patient wants to hold off on for now.  I recommend anti-inflammatory therapy for a few weeks and then will follow-up for reassessment.  At that point if symptoms have not resolved we can move forward with MRI and/or joint aspiration to assess cell counts and crystals.  Also counseled patient to seek emergent care should fever/chills or erythema and warmth around the joint develop.  We will follow back up in clinic in 2 weeks.  Plan :    -Return to clinic in 2 weeks     I personally saw and evaluated the patient, and participated in the management and treatment plan.  Marnee Spring, PA-C Orthopedics  This document was dictated using Systems analyst. A reasonable attempt at proof reading has been made to minimize errors.

## 2022-08-11 ENCOUNTER — Encounter (HOSPITAL_BASED_OUTPATIENT_CLINIC_OR_DEPARTMENT_OTHER): Payer: Self-pay | Admitting: Student

## 2022-08-11 ENCOUNTER — Ambulatory Visit (INDEPENDENT_AMBULATORY_CARE_PROVIDER_SITE_OTHER): Payer: Medicaid Other | Admitting: Student

## 2022-08-11 DIAGNOSIS — M25561 Pain in right knee: Secondary | ICD-10-CM | POA: Diagnosis not present

## 2022-08-11 NOTE — Progress Notes (Signed)
Chief Complaint: Right knee pain     History of Present Illness:    Tami Crawford is a 37 y.o. female presenting for follow-up of right knee pain and swelling.  She states that overall she is doing much better.  She says her swelling has decreased some and pain levels are much lower.  She does state that she continues to have some stiffness particularly when bending her knee.  She just took her last day of Mobic which she also says has been helping.   Surgical History:   None  PMH/PSH/Family History/Social History/Meds/Allergies:    Past Medical History:  Diagnosis Date   Gestational diabetes    Headache    Past Surgical History:  Procedure Laterality Date   PERINEAL LACERATION REPAIR N/A 04/28/2013   Procedure: SUTURE REPAIR PERINEAL LACERATION;  Surgeon: Geryl Rankins, MD;  Location: [facility];  Service: Gynecology;  Laterality: N/A;   Social History   Socioeconomic History   Marital status: Married    Spouse name: Not on file   Number of children: Not on file   Years of education: Not on file   Highest education level: Not on file  Occupational History   Not on file  Tobacco Use   Smoking status: Never   Smokeless tobacco: Never  Vaping Use   Vaping Use: Never used  Substance and Sexual Activity   Alcohol use: No   Drug use: No   Sexual activity: Yes  Other Topics Concern   Not on file  Social History Narrative   Not on file   Social Determinants of Health   Financial Resource Strain: Not on file  Food Insecurity: No Food Insecurity (05/19/2021)   Hunger Vital Sign    Worried About Running Out of Food in the Last Year: Never true    Ran Out of Food in the Last Year: Never true  Transportation Needs: No Transportation Needs (05/19/2021)   PRAPARE - Administrator, Civil Service (Medical): No    Lack of Transportation (Non-Medical): No  Physical Activity: Not on file  Stress: Not on file  Social  Connections: Not on file   Family History  Problem Relation Age of Onset   Healthy Mother    Other Father        sick a few days before he died, unknown cause   Asthma Neg Hx    Cancer Neg Hx    Diabetes Neg Hx    Hypertension Neg Hx    Heart disease Neg Hx    Stroke Neg Hx    No Known Allergies Current Outpatient Medications  Medication Sig Dispense Refill   acetaminophen (TYLENOL) 325 MG tablet Take 650 mg by mouth every 6 (six) hours as needed.     diclofenac Sodium (VOLTAREN) 1 % GEL Apply 4 g topically 4 (four) times daily. 50 g 3   meloxicam (MOBIC) 15 MG tablet Take 1 tablet (15 mg total) by mouth daily for 14 days. 14 tablet 0   norgestimate-ethinyl estradiol (ORTHO-CYCLEN) 0.25-35 MG-MCG tablet Take 1 tablet by mouth daily. 84 tablet 5   No current facility-administered medications for this visit.   No results found.  Review of Systems:   A ROS was performed including pertinent positives and negatives as documented in the HPI.  Physical Exam :  Constitutional: NAD and appears stated age Neurological: Alert and oriented Psych: Appropriate affect and cooperative not currently breastfeeding.   Comprehensive Musculoskeletal Exam:    No tenderness to palpation over right joint lines or patella.  Moderate effusion present.  Active range of motion from 0 degrees extension to approximately 110 degrees flexion.  Stable knee joint without laxity with varus or valgus stress.  Negative Lachman.  Distal neurosensory exam intact.  Imaging:    I personally reviewed and interpreted the radiographs.   Assessment:   37 y.o. female with atraumatic right knee pain and swelling.  Overall she is doing much better and her pain levels have significantly decreased.  She does have some stiffness of the knee which I suspect to be due to an effusion.  I did offer to aspirate the need to get her some relief as well as perform an intra-articular cortisone injection.  After some thought and  discussion with her husband, patient would like to hold off on performing this today.  She says if current symptoms continue for a few weeks she will return to clinic for injection and possible aspiration.  Recommended icing as well as topical anti-inflammatory for symptom relief.  Will return to clinic as needed.  Plan :    -Return to clinic as needed    I personally saw and evaluated the patient, and participated in the management and treatment plan.  Hazle Nordmann, PA-C Orthopedics  This document was dictated using Conservation officer, historic buildings. A reasonable attempt at proof reading has been made to minimize errors.

## 2023-05-05 NOTE — L&D Delivery Note (Signed)
 OB/GYN Faculty Practice Delivery Note  Tami Crawford is a 38 y.o. [id number] s/p SVD at [redacted]w[redacted]d. She was admitted for induction of labor in setting of T2DM.   ROM: 7h 32m with clear fluid GBS Status: Positive/-- (06/24 1644), on PCN Maximum Maternal Temperature: 98.74F   Labor Progress: Initial SVE: 1/thick/ballotable. She then progressed to complete.   Delivery Date/Time: 11/02/23 1349 Delivery: Called to room and patient was complete and pushing. Head delivered OA and restituted to LOA. No nuchal cord present. Shoulder and body delivered in usual fashion. Infant with spontaneous cry, placed on mother's abdomen, dried and stimulated. Due to poor color and tone, cord clamped x 2 after a 20 second delay, and cut by father of baby. Cord blood drawn. Placenta delivered spontaneously with gentle cord traction. Fundus firm with massage, TXA, and Pitocin . Labia, perineum, vagina, and cervix inspected inspected with a small perineal abrasion which was repaired in the usual fashion. Baby Weight: pending  Placenta: Sent to L&D Complications: None Lacerations: Perineal abrasion - repaired EBL: 50 mL Analgesia: Epidural   Infant:  APGAR (1 MIN): 7  APGAR (5 MINS): 9   Alain Sor, MD OB Fellow, Faculty Practice [facility], [facility] for [facility]

## 2023-05-18 ENCOUNTER — Ambulatory Visit (INDEPENDENT_AMBULATORY_CARE_PROVIDER_SITE_OTHER): Payer: Medicaid Other

## 2023-05-18 ENCOUNTER — Telehealth: Payer: Self-pay | Admitting: Lactation Services

## 2023-05-18 DIAGNOSIS — Z3201 Encounter for pregnancy test, result positive: Secondary | ICD-10-CM | POA: Diagnosis not present

## 2023-05-18 DIAGNOSIS — Z3200 Encounter for pregnancy test, result unknown: Secondary | ICD-10-CM

## 2023-05-18 DIAGNOSIS — O219 Vomiting of pregnancy, unspecified: Secondary | ICD-10-CM

## 2023-05-18 DIAGNOSIS — O3680X0 Pregnancy with inconclusive fetal viability, not applicable or unspecified: Secondary | ICD-10-CM

## 2023-05-18 DIAGNOSIS — O26899 Other specified pregnancy related conditions, unspecified trimester: Secondary | ICD-10-CM

## 2023-05-18 LAB — POCT PREGNANCY, URINE: Preg Test, Ur: POSITIVE — AB

## 2023-05-18 NOTE — Telephone Encounter (Signed)
 Patient called requesting a call about her results.

## 2023-05-18 NOTE — Progress Notes (Signed)
 Possible Pregnancy  Here today to leave urine specimen for pregnancy confirmation. UPT in office today is positive. Reviewed history of ectopic pregnancy with Lola, MD who recommends viability ultrasound with same day HCG lab. Called pt. Unable to contact. Sent MyChart message.   Vernell FORBES Ruddle, RN 05/18/2023  12:26 PM

## 2023-05-20 MED ORDER — PROMETHAZINE HCL 12.5 MG PO TABS: 12.5000 mg | ORAL_TABLET | Freq: Four times a day (QID) | ORAL | 2 refills | Status: DC | PRN

## 2023-05-20 MED ORDER — PRENATAL PLUS VITAMIN/MINERAL 27-1 MG PO TABS: 1.0000 | ORAL_TABLET | Freq: Every day | ORAL | 11 refills | Status: AC

## 2023-05-20 NOTE — Telephone Encounter (Signed)
Called pt to review provider recommendation for [place] and HCG same day. Scheduled for Monday. Will return for new OB visits.

## 2023-05-20 NOTE — Addendum Note (Signed)
Addended by: Marjo Bicker on: 05/20/2023 03:48 PM   Modules accepted: Orders

## 2023-05-24 ENCOUNTER — Ambulatory Visit (HOSPITAL_COMMUNITY)
Admission: RE | Admit: 2023-05-24 | Discharge: 2023-05-24 | Disposition: A | Discharge status: Home / Self Care | Payer: Medicaid Other | Source: Ambulatory Visit | Attending: Family Medicine | Admitting: Family Medicine

## 2023-05-24 ENCOUNTER — Other Ambulatory Visit: Payer: Medicaid Other

## 2023-05-24 DIAGNOSIS — O3680X0 Pregnancy with inconclusive fetal viability, not applicable or unspecified: Secondary | ICD-10-CM | POA: Insufficient documentation

## 2023-05-25 LAB — BETA HCG QUANT (REF LAB): hCG Quant: 22242 m[IU]/mL

## 2023-05-27 ENCOUNTER — Encounter: Payer: Self-pay | Admitting: *Deleted

## 2023-06-01 MED ORDER — FAMOTIDINE 20 MG PO TABS: 20.0000 mg | ORAL_TABLET | Freq: Two times a day (BID) | ORAL | 2 refills | Status: DC

## 2023-06-03 ENCOUNTER — Ambulatory Visit (INDEPENDENT_AMBULATORY_CARE_PROVIDER_SITE_OTHER): Payer: Medicaid Other | Admitting: General Practice

## 2023-06-03 ENCOUNTER — Other Ambulatory Visit (HOSPITAL_COMMUNITY)
Admission: RE | Admit: 2023-06-03 | Discharge: 2023-06-03 | Disposition: A | Discharge status: Home / Self Care | Payer: Medicaid Other | Source: Ambulatory Visit | Attending: Family Medicine | Admitting: Family Medicine

## 2023-06-03 ENCOUNTER — Encounter: Payer: Self-pay | Admitting: General Practice

## 2023-06-03 VITALS — BP 109/77 | HR 106 | Wt 229.0 lb

## 2023-06-03 DIAGNOSIS — O09522 Supervision of elderly multigravida, second trimester: Secondary | ICD-10-CM | POA: Diagnosis not present

## 2023-06-03 DIAGNOSIS — O0990 Supervision of high risk pregnancy, unspecified, unspecified trimester: Secondary | ICD-10-CM | POA: Diagnosis present

## 2023-06-03 DIAGNOSIS — O0992 Supervision of high risk pregnancy, unspecified, second trimester: Secondary | ICD-10-CM

## 2023-06-03 DIAGNOSIS — Z3A15 15 weeks gestation of pregnancy: Secondary | ICD-10-CM

## 2023-06-03 DIAGNOSIS — O09529 Supervision of elderly multigravida, unspecified trimester: Secondary | ICD-10-CM | POA: Insufficient documentation

## 2023-06-03 MED ORDER — BLOOD PRESSURE KIT DEVI: 1.0000 | Freq: Once | 0 refills | Status: AC

## 2023-06-03 NOTE — Patient Instructions (Signed)
Get blood pressure cuff prescription at [facility] - [place], [place] - [address]  phone number: [phone]

## 2023-06-03 NOTE — Progress Notes (Signed)
New OB Intake  New Ob intake completed in person.   I explained I am completing New OB Intake today. We discussed EDD of 12/11/2023, by Last Menstrual Period. Pt is [id number]. I reviewed her allergies, medications and Medical/Surgical/OB history.    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 Discussed the nature of our practice with multiple providers including residents and students. Due to the size of the practice, the delivering provider may not be the same as those providing prenatal care.   Patient is not interested in water birth.   MyChart/Babyscripts MyChart access verified. I explained pt will have some visits in office and some virtually. Babyscripts instructions given and order placed. Patient verifies receipt of registration text/e-mail. Account successfully created and app downloaded. If patient is a candidate for Optimized scheduling, add to sticky note.   Blood Pressure Cuff/Weight Scale Blood pressure cuff ordered for patient to pick-up from [employer]. Explained after first prenatal appt pt will check weekly.  Anatomy US Explained first scheduled [place] will be around 19 weeks. Anatomy US scheduled for February 25 at 1:15pm.  Is patient a CenteringPregnancy candidate?  Not a candidate due to Language barrier     Is patient a Mom+Baby Combined Care candidate?  Not a candidate     Interested in [place]? If yes, send referral and doula dot phrase.   Is patient a candidate for Babyscripts Optimization? No, due to language/history   First visit review I reviewed new OB appt with patient. Explained pt will be seen by  Albertine Grates at first visit. Discussed Avelina Laine genetic screening with patient. Routine prenatal labs collected today along with Panorama- had horizon in prior pregnancy  Last Pap Diagnosis  Date Value Ref Range Status  04/23/2021   Final   - Negative for intraepithelial lesion or malignancy (NILM)    Marylynn Pearson, RN 06/03/2023  10:37 AM

## 2023-06-04 LAB — COMPREHENSIVE METABOLIC PANEL
ALT: 16 [IU]/L (ref 0–32)
AST: 16 [IU]/L (ref 0–40)
Albumin: 4 g/dL (ref 3.9–4.9)
Alkaline Phosphatase: 58 [IU]/L (ref 44–121)
BUN/Creatinine Ratio: 17 (ref 9–23)
BUN: 8 mg/dL (ref 6–20)
Bilirubin Total: <0.2 mg/dL (ref 0.0–1.2)
CO2: 18 mmol/L — ABNORMAL LOW (ref 20–29)
Calcium: 9.6 mg/dL (ref 8.7–10.2)
Chloride: 102 mmol/L (ref 96–106)
Creatinine, Ser: 0.48 mg/dL — ABNORMAL LOW (ref 0.57–1.00)
Globulin, Total: 2.5 g/dL (ref 1.5–4.5)
Glucose: 221 mg/dL — ABNORMAL HIGH (ref 70–99)
Potassium: 4.1 mmol/L (ref 3.5–5.2)
Sodium: 134 mmol/L (ref 134–144)
Total Protein: 6.5 g/dL (ref 6.0–8.5)
eGFR: 127 mL/min/{1.73_m2} (ref >59)

## 2023-06-04 LAB — CBC/D/PLT+RPR+RH+ABO+RUBIGG...
Antibody Screen: NEGATIVE
Basophils Absolute: 0 10*3/uL (ref 0.0–0.2)
Basos: 1 %
EOS (ABSOLUTE): 0.1 10*3/uL (ref 0.0–0.4)
Eos: 1 %
HCV Ab: NONREACTIVE
HIV Screen 4th Generation wRfx: NONREACTIVE
Hematocrit: 36.8 % (ref 34.0–46.6)
Hemoglobin: 12.4 g/dL (ref 11.1–15.9)
Hepatitis B Surface Ag: NEGATIVE
Immature Grans (Abs): 0 10*3/uL (ref 0.0–0.1)
Immature Granulocytes: 1 %
Lymphocytes Absolute: 2.4 10*3/uL (ref 0.7–3.1)
Lymphs: 31 %
MCH: 28.1 pg (ref 26.6–33.0)
MCHC: 33.7 g/dL (ref 31.5–35.7)
MCV: 83 fL (ref 79–97)
Monocytes Absolute: 0.8 10*3/uL (ref 0.1–0.9)
Monocytes: 10 %
Neutrophils Absolute: 4.5 10*3/uL (ref 1.4–7.0)
Neutrophils: 56 %
Platelets: 218 10*3/uL (ref 150–450)
RBC: 4.41 x10E6/uL (ref 3.77–5.28)
RDW: 13.7 % (ref 11.7–15.4)
RPR Ser Ql: NONREACTIVE
Rh Factor: POSITIVE
Rubella Antibodies, IGG: 23.8 {index} (ref >0.99)
WBC: 7.8 10*3/uL (ref 3.4–10.8)

## 2023-06-04 LAB — HCV INTERPRETATION

## 2023-06-04 LAB — HEMOGLOBIN A1C
Est. average glucose Bld gHb Est-mCnc: 146 mg/dL
Hgb A1c MFr Bld: 6.7 % — ABNORMAL HIGH (ref 4.8–5.6)

## 2023-06-04 LAB — GC/CHLAMYDIA PROBE AMP (~~LOC~~) NOT AT ARMC
Chlamydia: NEGATIVE
Comment: NEGATIVE
Comment: NORMAL
Neisseria Gonorrhea: NEGATIVE

## 2023-06-04 LAB — PROTEIN / CREATININE RATIO, URINE
Creatinine, Urine: 84 mg/dL
Protein, Ur: 10.3 mg/dL
Protein/Creat Ratio: 123 mg/g{creat} (ref 0–200)

## 2023-06-05 LAB — CULTURE, OB URINE

## 2023-06-05 LAB — URINE CULTURE, OB REFLEX

## 2023-06-05 IMAGING — US US OB COMP LESS 14 WK
1 series · 15 of 28 positions shown · non-contrast
Comparison: No recent study is available for review.

CLINICAL DATA: Cramping/spotting.

EXAM:
OBSTETRIC <14 WK ULTRASOUND
TECHNIQUE: Transabdominal ultrasound was performed for evaluation of the
gestation as well as the maternal uterus and adnexal regions.

[Series 1: us ob comp less 14 wk · 15 of 41 slices shown]
[im 1/41]
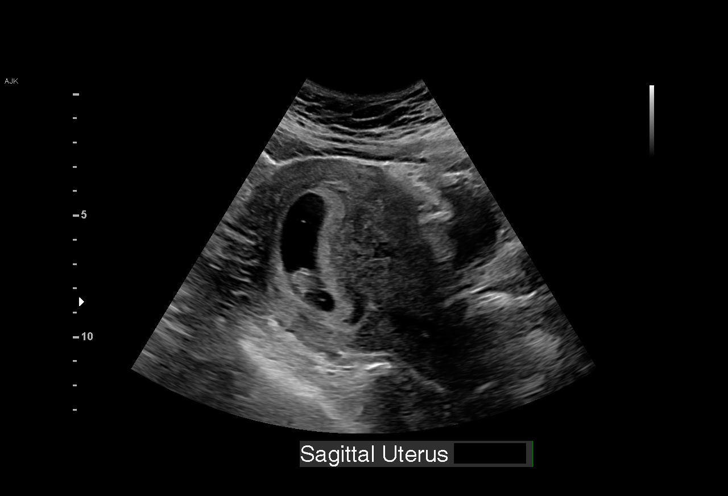
[im 3/41]
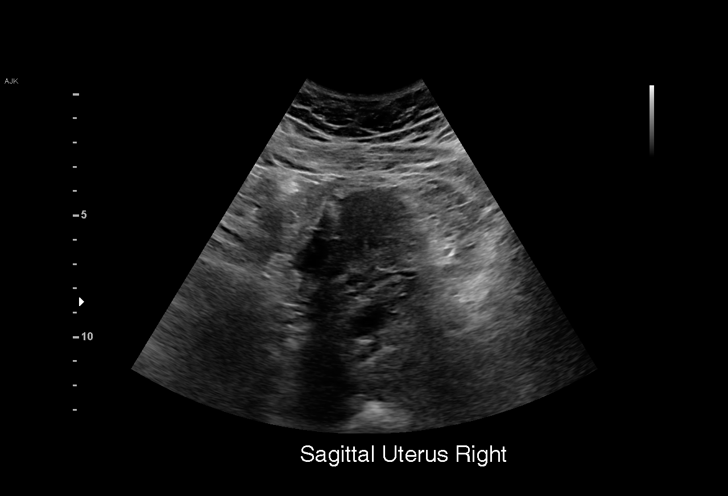
[im 6/41]
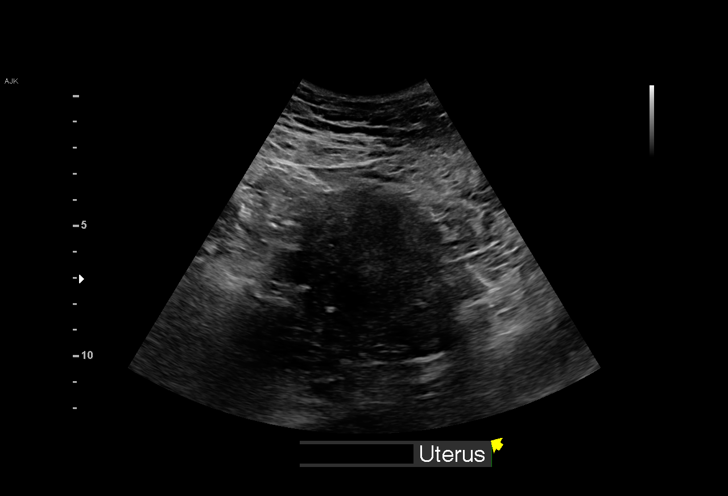
[im 9/41]
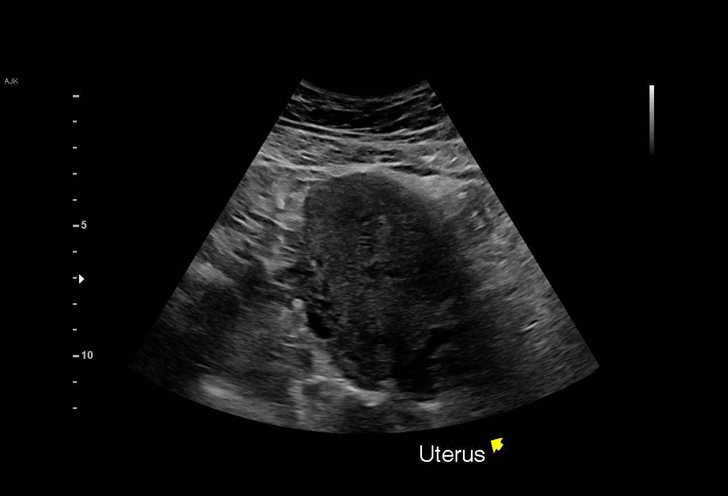
[im 12/41]
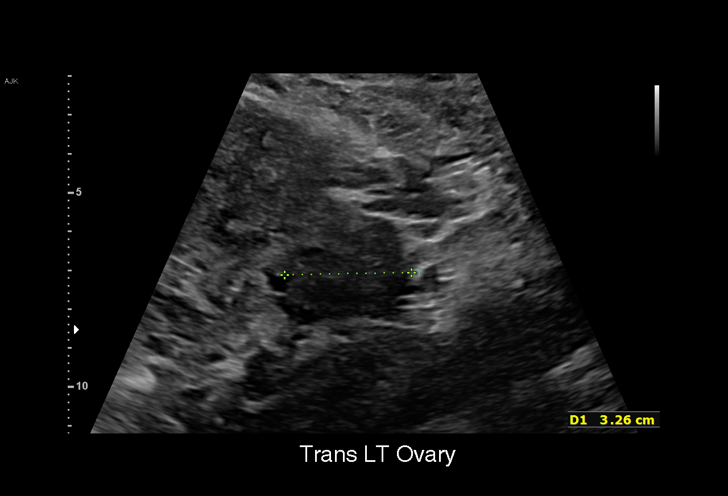
[im 15/41]
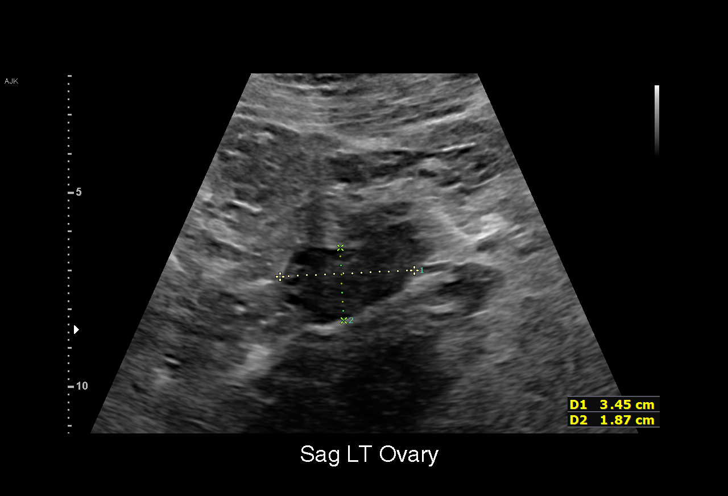
[im 18/41]
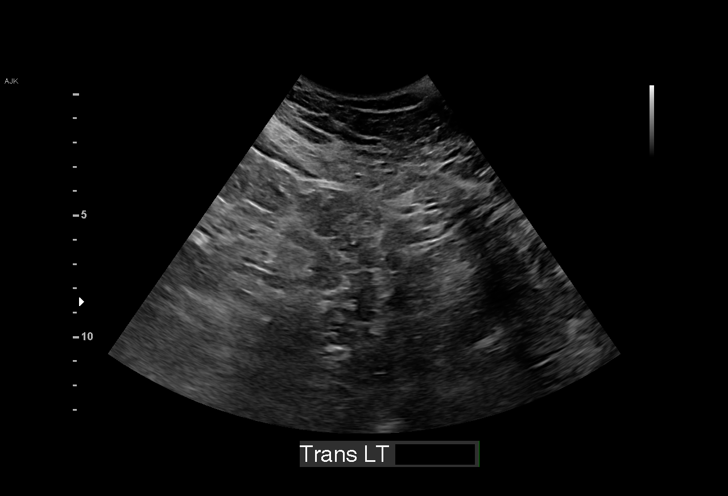
[im 21/41]
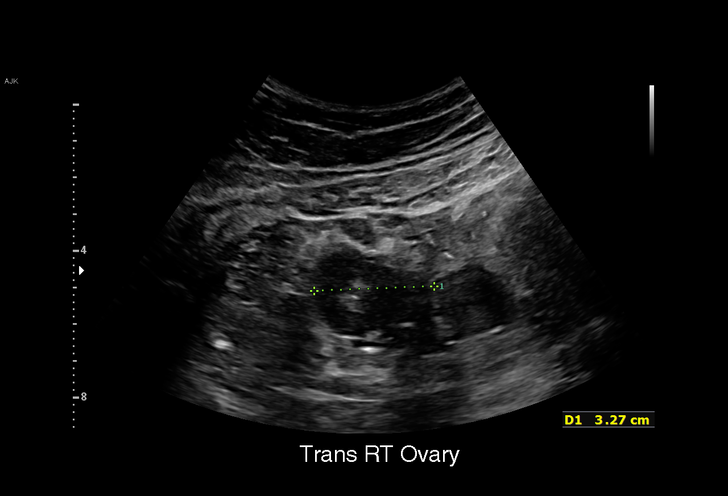
[im 23/41]
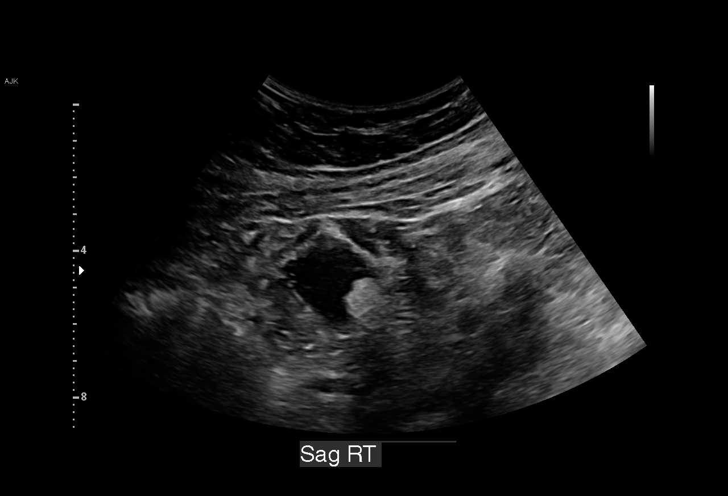
[im 26/41]
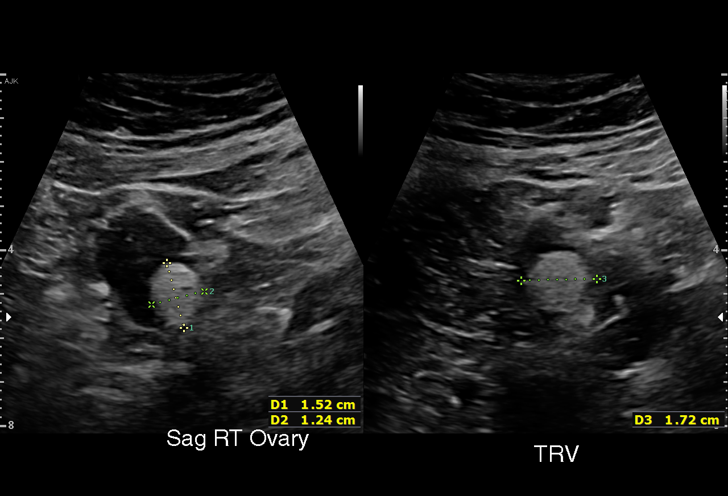
[im 29/41]
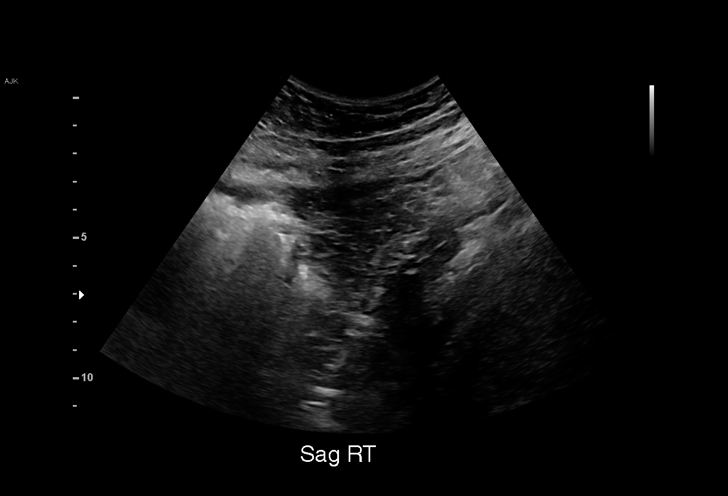
[im 32/41]
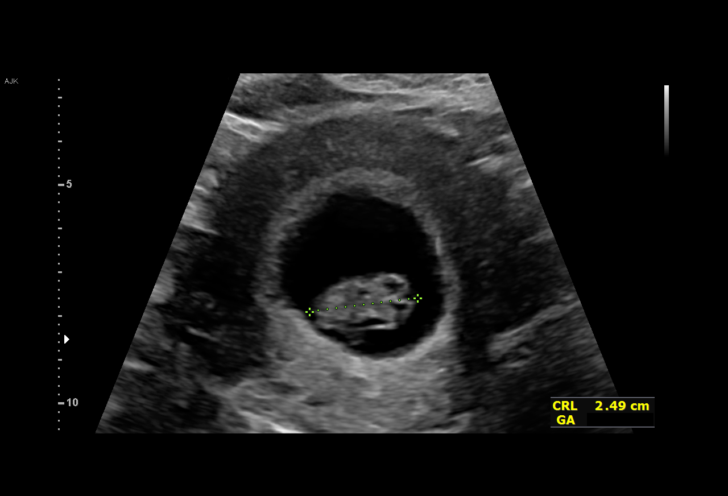
[im 35/41]
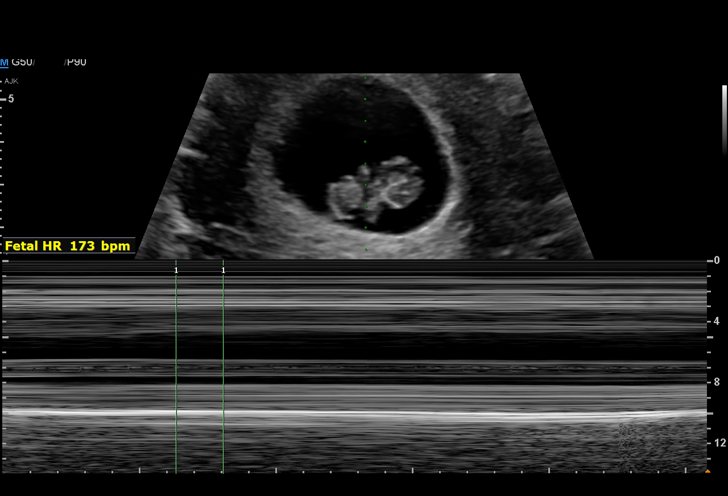
[im 38/41]
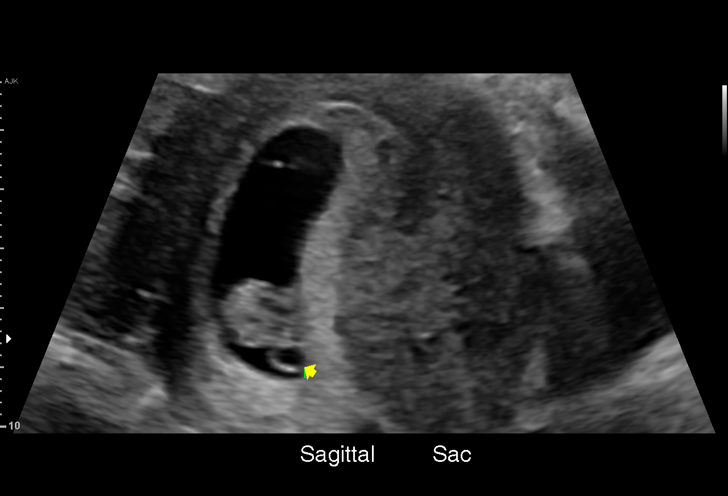
[im 41/41]
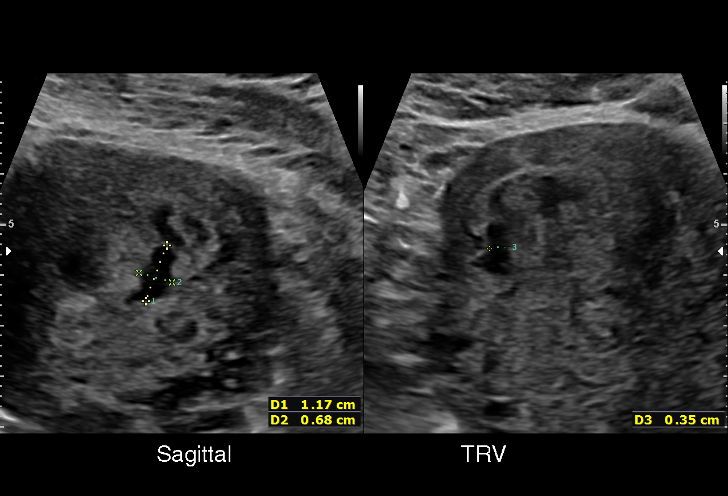

[15 of 28 positions shown; findings below may reference images not displayed]

FINDINGS: There is a single intrauterine gestational sac. A fetal pole and
small yolk sac are identified.

Heart Rate: 173 bpm

CRL:   24.6 mm   9 w 1 d                  US EDC: 10/13/2021

Subchorionic hemorrhage: Small subchorionic hemorrhage measuring
x 0.7 x 0.4 cm.

Maternal uterus/adnexae: The left ovary measures 3.3 x 3.5 x 1.9 cm.
The right ovary measures 3.3 by 3.4 x 2.2 cm. There is a 1.5 x 1.2 x
1.7 cm hyperechoic lesion in the right ovary, possible dermoid.
IMPRESSION: 1. Single live intrauterine pregnancy with estimated gestational age
of 9 weeks 1 day. EDC 10/13/2021.
2. Small subchorionic hemorrhage.
3. Hyperechoic lesion in the right ovary, possible dermoid cyst.

## 2023-06-10 ENCOUNTER — Ambulatory Visit: Payer: Medicaid Other | Admitting: Obstetrics and Gynecology

## 2023-06-10 ENCOUNTER — Encounter: Payer: Self-pay | Admitting: Obstetrics and Gynecology

## 2023-06-10 VITALS — BP 121/83 | HR 97 | Wt 233.3 lb

## 2023-06-10 DIAGNOSIS — Z8759 Personal history of other complications of pregnancy, childbirth and the puerperium: Secondary | ICD-10-CM | POA: Insufficient documentation

## 2023-06-10 DIAGNOSIS — O24119 Pre-existing diabetes mellitus, type 2, in pregnancy, unspecified trimester: Secondary | ICD-10-CM

## 2023-06-10 DIAGNOSIS — O09522 Supervision of elderly multigravida, second trimester: Secondary | ICD-10-CM | POA: Diagnosis not present

## 2023-06-10 DIAGNOSIS — O0990 Supervision of high risk pregnancy, unspecified, unspecified trimester: Secondary | ICD-10-CM | POA: Diagnosis not present

## 2023-06-10 DIAGNOSIS — Z3A16 16 weeks gestation of pregnancy: Secondary | ICD-10-CM

## 2023-06-10 HISTORY — DX: Personal history of other complications of pregnancy, childbirth and the puerperium: Z87.59

## 2023-06-10 MED ORDER — ACCU-CHEK SOFTCLIX LANCETS MISC: 12 refills | Status: DC

## 2023-06-10 MED ORDER — ACCU-CHEK GUIDE W/DEVICE KIT: 1.0000 | PACK | Freq: Four times a day (QID) | 0 refills | Status: DC

## 2023-06-10 MED ORDER — GLUCOSE BLOOD VI STRP: ORAL_STRIP | 12 refills | Status: DC

## 2023-06-10 MED ORDER — ASPIRIN 81 MG PO TBEC: 81.0000 mg | DELAYED_RELEASE_TABLET | Freq: Every day | ORAL | 12 refills | Status: DC

## 2023-06-10 NOTE — Progress Notes (Signed)
 alb  INITIAL PRENATAL VISIT  Subjective:   Tami Crawford is being seen today for her first obstetrical visit.  She is at [redacted]w[redacted]d gestation by ultrasound Her obstetrical history is significant for  AMA, hx GHTN, GDM, preexisting type 2 diabetes mellitus  and history of ectopic pregnancy Relationship with FOB: spouse, living together. Patient does intend to breast feed. Pregnancy history fully reviewed.  Patient reports no complaints.  Indications for ASA therapy (per uptodate) One of the following: Previous pregnancy with preeclampsia, especially early onset and with an adverse outcome -- GHTN Multifetal gestation No Chronic hypertension No Type 1 or 2 diabetes mellitus Yes Chronic kidney disease No Autoimmune disease (antiphospholipid syndrome, systemic lupus erythematosus) No  Two or more of the following: Nulliparity No Obesity (body mass index >30 kg/m2) Yes Family history of preeclampsia in mother or sister No Age >=35 years Yes Sociodemographic characteristics (African American race, low socioeconomic level) Yes Personal risk factors (eg, previous pregnancy with low birth weight or small for gestational age infant, previous adverse pregnancy outcome [eg, stillbirth], interval >10 years between pregnancies) No   Objective:    Obstetric History OB History  Gravida Para Term Preterm AB Living  5 3 3  0 1 3  SAB IAB Ectopic Multiple Live Births  0 0 1 0 3    # Outcome Date GA Lbr Len/2nd Weight Sex Type Anes PTL Lv  5 Current           4 Ectopic 2024          3 Term 09/29/21 [redacted]w[redacted]d 02:27 / 02:25 8 lb 6.4 oz (3.81 kg) M Vag-Spont EPI  LIV     Birth Comments: WNL     Complications: Gestational hypertension, GDM (gestational diabetes mellitus)  2 Term 01/26/15 [redacted]w[redacted]d 09:16 / 00:29 7 lb 13.2 oz (3.549 kg) M Vag-Spont EPI  LIV  1 Term 03/16/13 [redacted]w[redacted]d 08:47 / 01:54 7 lb 6.5 oz (3.359 kg) M Vag-Spont EPI, Local  LIV    Past Medical History:  Diagnosis Date   Ectopic  pregnancy 04/18/2022   Methotrexate  administered 04/18/2022  HCG <1 on 06/03/2022     Gestational diabetes    Gestational hypertension    Headache     Past Surgical History:  Procedure Laterality Date   PERINEAL LACERATION REPAIR N/A 04/28/2013   Procedure: SUTURE REPAIR PERINEAL LACERATION;  Surgeon: Hargis Paradise, MD;  Location: [facility];  Service: Gynecology;  Laterality: N/A;    Current Outpatient Medications on File Prior to Visit  Medication Sig Dispense Refill   famotidine  (PEPCID ) 20 MG tablet Take 1 tablet (20 mg total) by mouth 2 (two) times daily. 60 tablet 2   Prenatal Vit-Fe Fumarate-FA (PRENATAL PLUS VITAMIN/MINERAL) 27-1 MG TABS Take 1 tablet by mouth daily. 30 tablet 11   promethazine  (PHENERGAN ) 12.5 MG tablet Take 1 tablet (12.5 mg total) by mouth every 6 (six) hours as needed for nausea or vomiting. 30 tablet 2   No current facility-administered medications on file prior to visit.    No Known Allergies  Social History:  reports that she has never smoked. She has never been exposed to tobacco smoke. She has never used smokeless tobacco. She reports that she does not drink alcohol and does not use drugs.  Family History  Problem Relation Age of Onset   Healthy Mother    Other Father        sick a few days before he died, unknown cause   Asthma Neg Hx  Cancer Neg Hx    Diabetes Neg Hx    Hypertension Neg Hx    Heart disease Neg Hx    Stroke Neg Hx     The following portions of the patient's history were reviewed and updated as appropriate: allergies, current medications, past family history, past medical history, past social history, past surgical history and problem list.  Review of Systems Review of Systems  All other systems reviewed and are negative.     Physical Exam:  BP 121/83 Comment: around wrist  Pulse 97   Wt 233 lb 4.8 oz (105.8 kg)   LMP 03/06/2023 (Approximate)   BMI 35.47 kg/m  CONSTITUTIONAL: Well-developed, well-nourished female  in no acute distress.  HENT:  Normocephalic, atraumatic.   EYES: Conjunctivae normal.  NECK: Normal range of motion SKIN: Skin is warm and dry.  MUSCULOSKELETAL: Normal range of motion NEUROLOGIC: Alert and oriented  PSYCHIATRIC: Normal mood and affect. Normal behavior. Normal judgment and thought content. CARDIOVASCULAR: Normal heart rate noted RESPIRATORY: normal effort ABDOMEN: Soft PELVIC: deferred  Fetal Heart Rate (bpm): 160   Movement: Present       Assessment:    Pregnancy: G5P3013  1. Supervision of high risk pregnancy, antepartum (Primary) BP and FHR normal Doing well, feeling movement    2. Multigravida of advanced maternal age in second trimester Will follow MFM, initiate ASA   3. Pre-existing type 2 diabetes mellitus during pregnancy, antepartum 06/03/23 A1c 6.7, 02/11/22 A1c 6.2, 04/23/21 A1c 6.9 Reports not checking blood sugars, supplies sent, log provided, discussed importance of keeping log, monitor need for meds Will schedule with diabetic educator Discussed recommendation for ASA in pregnancy, rx sent  - Blood Glucose Monitoring Suppl (ACCU-CHEK GUIDE) w/Device KIT; 1 Device by Does not apply route in the morning, at noon, in the evening, and at bedtime.  Dispense: 1 kit; Refill: 0 - Accu-Chek Softclix Lancets lancets; Use as instructed  Dispense: 100 each; Refill: 12  4. History of gestational hypertension Normotensive today, to start ASA   5. [redacted] weeks gestation of pregnancy Discussed AFP today, collecting today - AFP, Serum, Open Spina Bifida  6. History of ectopic pregnancy     Plan:     Initial labs drawn. Prenatal vitamins. Problem list reviewed and updated. Reviewed in detail the nature of the practice with collaborative care between  Genetic screening discussed: NIPS/First trimester screen/Quad/AFP ordered. Role of ultrasound in pregnancy discussed; Anatomy US : ordered.  Follow up in 4 weeks. Weight gain recommendations per IOM  guidelines reviewed: underweight/BMI 18.5 or less > 28 - 40 lbs; normal weight/BMI 18.5 - 24.9 > 25 - 35 lbs; overweight/BMI 25 - 29.9 > 15 - 25 lbs; obese/BMI  30 or more > 11 - 20 lbs.  Discussed clinic routines, schedule of care and testing, genetic screening options, involvement of students and residents under the direct supervision of APPs and doctors and presence of female providers. Pt verbalized understanding.  Return in 4 weeks for routine prenatal  Future Appointments  Date Time Provider Department Center  06/17/2023  1:15 PM [facility]  06/29/2023  1:15 PM [facility] NURSE [facility]  06/29/2023  1:30 PM [facility] US3 [facility]  06/29/2023  2:30 PM [facility] PROVIDER 1 [facility]  07/08/2023 10:55 AM Fredirick Glenys RAMAN, MD [facility]    Delores Nidia CROME, FNP

## 2023-06-11 ENCOUNTER — Encounter: Payer: Self-pay | Admitting: *Deleted

## 2023-06-11 LAB — PANORAMA PRENATAL TEST FULL PANEL:PANORAMA TEST PLUS 5 ADDITIONAL MICRODELETIONS: FETAL FRACTION: 4.5

## 2023-06-12 LAB — AFP, SERUM, OPEN SPINA BIFIDA
AFP MoM: 1
AFP Value: 26.3 ng/mL
Gest. Age on Collection Date: 16.3 wk
Maternal Age At EDD: 35.8 a
OSBR Risk 1 IN: 10000
Test Results:: NEGATIVE
Weight: 233 [lb_av]

## 2023-06-14 ENCOUNTER — Encounter: Payer: Medicaid Other | Admitting: Obstetrics & Gynecology

## 2023-06-16 ENCOUNTER — Other Ambulatory Visit: Payer: Self-pay | Admitting: *Deleted

## 2023-06-16 DIAGNOSIS — O0990 Supervision of high risk pregnancy, unspecified, unspecified trimester: Secondary | ICD-10-CM

## 2023-06-16 DIAGNOSIS — O24119 Pre-existing diabetes mellitus, type 2, in pregnancy, unspecified trimester: Secondary | ICD-10-CM

## 2023-06-17 ENCOUNTER — Other Ambulatory Visit: Payer: Medicaid Other

## 2023-06-24 DIAGNOSIS — O99210 Obesity complicating pregnancy, unspecified trimester: Secondary | ICD-10-CM | POA: Insufficient documentation

## 2023-06-29 ENCOUNTER — Ambulatory Visit: Payer: Medicaid Other | Admitting: *Deleted

## 2023-06-29 ENCOUNTER — Ambulatory Visit: Payer: Medicaid Other | Attending: Obstetrics and Gynecology

## 2023-06-29 ENCOUNTER — Ambulatory Visit: Payer: Medicaid Other | Admitting: Obstetrics and Gynecology

## 2023-06-29 ENCOUNTER — Other Ambulatory Visit: Payer: Self-pay | Admitting: *Deleted

## 2023-06-29 VITALS — BP 118/70 | HR 72

## 2023-06-29 DIAGNOSIS — E119 Type 2 diabetes mellitus without complications: Secondary | ICD-10-CM

## 2023-06-29 DIAGNOSIS — O09522 Supervision of elderly multigravida, second trimester: Secondary | ICD-10-CM | POA: Diagnosis present

## 2023-06-29 DIAGNOSIS — O0990 Supervision of high risk pregnancy, unspecified, unspecified trimester: Secondary | ICD-10-CM | POA: Insufficient documentation

## 2023-06-29 DIAGNOSIS — E669 Obesity, unspecified: Secondary | ICD-10-CM

## 2023-06-29 DIAGNOSIS — E6689 Other obesity not elsewhere classified: Secondary | ICD-10-CM | POA: Diagnosis present

## 2023-06-29 DIAGNOSIS — O99212 Obesity complicating pregnancy, second trimester: Secondary | ICD-10-CM | POA: Insufficient documentation

## 2023-06-29 DIAGNOSIS — Z3A19 19 weeks gestation of pregnancy: Secondary | ICD-10-CM | POA: Diagnosis present

## 2023-06-29 DIAGNOSIS — O99210 Obesity complicating pregnancy, unspecified trimester: Secondary | ICD-10-CM

## 2023-06-29 DIAGNOSIS — O24112 Pre-existing diabetes mellitus, type 2, in pregnancy, second trimester: Secondary | ICD-10-CM

## 2023-06-29 DIAGNOSIS — O24119 Pre-existing diabetes mellitus, type 2, in pregnancy, unspecified trimester: Secondary | ICD-10-CM

## 2023-06-29 DIAGNOSIS — O09292 Supervision of pregnancy with other poor reproductive or obstetric history, second trimester: Secondary | ICD-10-CM

## 2023-06-29 NOTE — Progress Notes (Signed)
 Maternal-Fetal Medicine Consultation I had the pleasure of seeing Ms. Tami Crawford [facility]. She is G5 P3013 at 19w 1d gestation and is here for fetal anatomy scan. Advanced maternal age.  On cell-free fetal DNA screening, the risks of fetal aneuploidies are not increased.  MSAFP screening showed low risk for open neural tube defects. Her pregnancy is dated by 14-week ultrasound (CRL measurement).  Past medical history significant for type 2 diabetes.  Recent hemoglobin A1c is 6.7%.  Patient has not yet started checking her blood glucose and is waiting for the supplies.   Obstetric history is significant for 3 term vaginal deliveries.  All her children are in good health.  Her first pregnancy was complicated by gestational hypertension.  Patient takes low-dose aspirin prophylaxis.  Ultrasound We performed a fetal anatomical survey.  Amniotic fluid is normal and good fetal activity seen.  Fetal biometry is consistent with the previously established dates.  No markers of aneuploidies or obvious fetal structural defects are seen.  Type 2 diabetes Pregestational diabetes: Based on her history and hemoglobin A1C, the patient has type 2 diabetes. Patient is yet to start checking her blood glucose levels. I explained the normal parameters of blood glucose levels and encouraged her to regularly check her blood glucose levels.  I discussed the importance of good control of diabetes to prevent adverse fetal and neonatal outcomes. The likelihood of congenital malformations with a periconception hemoglobin A1C of 6.7% is very low (about 3%). However, fetal congenital malformations are slightly increased even if diabetes is well controlled.  Other complications include fetal macrosomia leading shoulder dystocia or neurological injuries at birth (Erb's palsy). Neonatal complications including respiratory-distress syndrome, hypoglycemia can also occur. In  poorly-controlled diabetes, the likelihood of stillbirth is increased.  I discussed management that includes diet, exercise and metformin or insulin.  I discussed ultrasound protocol. Serial fetal growth assessments and weekly antenatal testing beginning at 32 weeks till delivery will be performed.  Delivery is recommended at 39 weeks if diabetes is well controlled. Early term delivery (37 to 39 weeks) may be recommended if poor control is seen.  I discussed the benefit of low-dose aspirin in reducing the likelihood of developing preeclampsia. Patient is taking low-dose aspirin daily.  I recommended fetal echocardiography.  Recommendations -An appointment was made for her to return in 5 weeks for completion of fetal anatomy (fetal profile). -We have requested an appointment for fetal echocardiography [facility]). -Fetal growth assessments every 4 weeks till delivery. -Weekly BPP from [redacted] weeks gestation till delivery.  Consultation including face-to-face (more than 50%) counseling 30 minutes.

## 2023-07-01 ENCOUNTER — Ambulatory Visit: Payer: Medicaid Other

## 2023-07-01 ENCOUNTER — Other Ambulatory Visit: Payer: Self-pay

## 2023-07-01 ENCOUNTER — Encounter: Attending: Obstetrics and Gynecology | Admitting: Dietician

## 2023-07-01 DIAGNOSIS — O24119 Pre-existing diabetes mellitus, type 2, in pregnancy, unspecified trimester: Secondary | ICD-10-CM

## 2023-07-01 DIAGNOSIS — Z713 Dietary counseling and surveillance: Secondary | ICD-10-CM | POA: Diagnosis not present

## 2023-07-01 DIAGNOSIS — Z3A19 19 weeks gestation of pregnancy: Secondary | ICD-10-CM

## 2023-07-01 DIAGNOSIS — Z3A00 Weeks of gestation of pregnancy not specified: Secondary | ICD-10-CM | POA: Insufficient documentation

## 2023-07-01 NOTE — Progress Notes (Signed)
 Patient was seen for Pre-existing Diabetes During Pregnancy on 07/01/2023   Start time 1330 and End time 1418  Estimated due date: 11/22/2023; [redacted]w[redacted]d   Clinical: Medications:  Current Outpatient Medications:    Accu-Chek Softclix Lancets lancets, Use as instructed, Disp: 100 each, Rfl: 12   aspirin EC 81 MG tablet, Take 1 tablet (81 mg total) by mouth daily. Swallow whole., Disp: 30 tablet, Rfl: 12   Blood Glucose Monitoring Suppl (ACCU-CHEK GUIDE) w/Device KIT, 1 Device by Does not apply route in the morning, at noon, in the evening, and at bedtime., Disp: 1 kit, Rfl: 0   glucose blood test strip, Check blood glucose four times a day, Disp: 100 each, Rfl: 12   Prenatal Vit-Fe Fumarate-FA (PRENATAL PLUS VITAMIN/MINERAL) 27-1 MG TABS, Take 1 tablet by mouth daily., Disp: 30 tablet, Rfl: 11   promethazine (PHENERGAN) 12.5 MG tablet, Take 1 tablet (12.5 mg total) by mouth every 6 (six) hours as needed for nausea or vomiting., Disp: 30 tablet, Rfl: 2   famotidine (PEPCID) 20 MG tablet, Take 1 tablet (20 mg total) by mouth 2 (two) times daily. (Patient not taking: Reported on 07/01/2023), Disp: 60 tablet, Rfl: 2   Medical History:  Past Medical History:  Diagnosis Date   Ectopic pregnancy 04/18/2022   Methotrexate administered 04/18/2022  HCG <1 on 06/03/2022     Gestational diabetes    Gestational hypertension    Headache    History of gestational hypertension 03/15/2013   Language barrier 07/15/2021   Pre-existing type 2 diabetes mellitus during pregnancy, antepartum 06/10/2021   12/22: Hgb A1C 6.9 early pregnancy  7/23: 2 hour GTT PP           Glucose, GTT - Fasting    70 - 99 mg/dL    91       Glucose, 2 hour    70 - 139 mg/dL    161            Labs: OGTT none  Lab Results  Component Value Date   HGBA1C 6.7 (H) 06/03/2023     Dietary and Lifestyle History: Pt present today alone with her glucometer kit, lancets and strips. Pt reports previous gestational diabetes an used oral  medications for blood sugar control stating unknown name of medication. 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 Pt reports shared shopping with husband and Pt does the cooking. Pt reports she lives with her husband and 3 children. Pt reports she avoid pork for religious reasons. Pt reports she does not like milk and is willing to try plant based milk and yogurt for a calcium rich food source. All Pt's questions were answered during this encounter.  Physical Activity: walking 2-3 days ~15 minutes Stress: none Sleep: good  24 hr Recall:  First Meal: eggs, 2  slices white bread, water Snack: 2-3 almond peanut clusters Second meal: nido powdered milk with water, broccoli (~12 pm) or rice, chicken, salad Snack: crackers Third meal: rice, broccoli, corn, carrots, water Snack: none Beverages: water, lemonade, nido milk  NUTRITION INTERVENTION  Nutrition education (E-1) on the following topics:   Initial Follow-up  [x]  []  Definition of Gestational Diabetes [x]  []  Why dietary management is important in controlling blood glucose [x]  []  Effects each nutrient has on blood glucose levels [x]  []  Simple carbohydrates vs complex carbohydrates [x]  []  Fluid intake [x]  []  Creating a balanced meal plan [x]  []  Carbohydrate counting  [x]  []  When to check blood glucose levels [x]  []  Proper blood  glucose monitoring techniques [x]  []  Effect of stress and stress reduction techniques  [x]  []  Exercise effect on blood glucose levels, appropriate exercise during pregnancy [x]  []  Importance of limiting caffeine and abstaining from alcohol and smoking [x]  []  Medications used for blood sugar control during pregnancy [x]  []  Hypoglycemia and rule of 15 [x]  []  Postpartum self care    Patient has a meter prior to visit. Patient is instructed to begin testing pre breakfast and 2 hours after each meal. CBG: 190 mg/dL, reported as 2 hour post prandial   Patient instructed to monitor glucose levels: QID FBS: 60 -  <= 95 mg/dL; 2 hour: <= 284 mg/dL  Patient received handouts: Nutrition Diabetes and Pregnancy Carbohydrate Counting List Blood glucose log Snack ideas for diabetes during pregnancy  Patient will be seen for follow-up as needed.

## 2023-07-08 ENCOUNTER — Ambulatory Visit: Payer: Medicaid Other | Admitting: Family Medicine

## 2023-07-08 VITALS — BP 123/80 | HR 107 | Wt 239.0 lb

## 2023-07-08 DIAGNOSIS — O24112 Pre-existing diabetes mellitus, type 2, in pregnancy, second trimester: Secondary | ICD-10-CM

## 2023-07-08 DIAGNOSIS — Z3A20 20 weeks gestation of pregnancy: Secondary | ICD-10-CM | POA: Diagnosis not present

## 2023-07-08 DIAGNOSIS — O0992 Supervision of high risk pregnancy, unspecified, second trimester: Secondary | ICD-10-CM | POA: Diagnosis not present

## 2023-07-08 DIAGNOSIS — O09522 Supervision of elderly multigravida, second trimester: Secondary | ICD-10-CM

## 2023-07-08 DIAGNOSIS — O0990 Supervision of high risk pregnancy, unspecified, unspecified trimester: Secondary | ICD-10-CM

## 2023-07-08 DIAGNOSIS — O24119 Pre-existing diabetes mellitus, type 2, in pregnancy, unspecified trimester: Secondary | ICD-10-CM

## 2023-07-08 DIAGNOSIS — Z8759 Personal history of other complications of pregnancy, childbirth and the puerperium: Secondary | ICD-10-CM

## 2023-07-08 MED ORDER — METFORMIN HCL 1000 MG PO TABS
1000.0000 mg | ORAL_TABLET | Freq: Two times a day (BID) | ORAL | 2 refills | Status: DC
Start: 2023-07-08 — End: 2023-09-14

## 2023-07-08 NOTE — Patient Instructions (Signed)
 Following an appropriate diet and keeping your blood sugar under control is the most important thing to do for your health and that of your unborn baby.  Please check your blood sugar 4 times daily.  Please keep accurate BS logs and bring them with you to every visit.  Please bring your meter also.  Goals for Blood sugar should be: 1. Fasting (first thing in the morning before eating) should be less than 90.   2.  2 hours after meals should be less than 120.  Please eat 3 meals and 3 snacks.  Include protein (meat, dairy-cheese, eggs, nuts) with all meals.  Be mindful that carbohydrates increase your blood sugar.  Not just sweet food (cookies, cake, donuts, fruit, juice, soda) but also bread, pasta, rice, and potatoes.  You have to limit how many carbs you are eating.  Adding exercise, as little as 30 minutes a day can decrease your blood sugar.

## 2023-07-08 NOTE — Progress Notes (Signed)
   PRENATAL VISIT NOTE  Subjective:  Tami Crawford is a 38 y.o. [id number] at [redacted]w[redacted]d being seen today for ongoing prenatal care.  She is currently monitored for the following issues for this high-risk pregnancy and has History of gestational hypertension; Pre-existing type 2 diabetes mellitus during pregnancy, antepartum; Supervision of high risk pregnancy, antepartum; Advanced maternal age in multigravida; History of ectopic pregnancy; and Obesity affecting pregnancy, antepartum on their problem list.  Patient reports no complaints.  Contractions: Not present. Vag. Bleeding: None.  Movement: Present. Denies leaking of fluid.   The following portions of the patient's history were reviewed and updated as appropriate: allergies, current medications, past family history, past medical history, past social history, past surgical history and problem list.   Objective:   Vitals:   07/08/23 1116  BP: 123/80  Pulse: (!) 107  Weight: 239 lb (108.4 kg)    Fetal Status: Fetal Heart Rate (bpm): 159   Movement: Present     General:  Alert, oriented and cooperative. Patient is in no acute distress.  Skin: Skin is warm and dry. No rash noted.   Cardiovascular: Normal heart rate noted  Respiratory: Normal respiratory effort, no problems with respiration noted  Abdomen: Soft, gravid, appropriate for gestational age.  Pain/Pressure: Absent     Pelvic: Cervical exam deferred        Extremities: Normal range of motion.  Edema: None  Mental Status: Normal mood and affect. Normal behavior. Normal judgment and thought content.   Assessment and Plan:  Pregnancy: G5P3013 at [redacted]w[redacted]d 1. [redacted] weeks gestation of pregnancy (Primary)   2. Pre-existing type 2 diabetes mellitus during pregnancy, antepartum See log, no meter.    Discussed insulin as 1st line and preference especially given range but pt. Declines. Will attempt high dose metformin and improved diet. Has fetal echo and serial u/s scheduled Needs to  see ophthalmology - metFORMIN (GLUCOPHAGE) 1000 MG tablet; Take 1 tablet (1,000 mg total) by mouth 2 (two) times daily with a meal.  Dispense: 60 tablet; Refill: 2 - Ambulatory referral to Ophthalmology  3. Supervision of high risk pregnancy, antepartum   4. History of gestational hypertension On ASA  5. Multigravida of advanced maternal age in second trimester LR NIPT  General obstetric precautions including but not limited to vaginal bleeding, contractions, leaking of fluid and fetal movement were reviewed in detail with the patient. Please refer to After Visit Summary for other counseling recommendations.   Return in 2 weeks (on 07/22/2023) for [facility].  Future Appointments  Date Time Provider Department Center  08/03/2023  2:15 PM [facility] NURSE [facility]  08/03/2023  2:30 PM [facility] US3 [facility]    Reva Bores, MD

## 2023-07-22 ENCOUNTER — Ambulatory Visit: Admitting: Obstetrics & Gynecology

## 2023-07-22 VITALS — BP 134/76 | HR 100 | Wt 239.0 lb

## 2023-07-22 DIAGNOSIS — Z3A22 22 weeks gestation of pregnancy: Secondary | ICD-10-CM

## 2023-07-22 DIAGNOSIS — O24112 Pre-existing diabetes mellitus, type 2, in pregnancy, second trimester: Secondary | ICD-10-CM

## 2023-07-22 DIAGNOSIS — O0990 Supervision of high risk pregnancy, unspecified, unspecified trimester: Secondary | ICD-10-CM

## 2023-07-22 DIAGNOSIS — O0992 Supervision of high risk pregnancy, unspecified, second trimester: Secondary | ICD-10-CM | POA: Diagnosis not present

## 2023-07-22 DIAGNOSIS — O24119 Pre-existing diabetes mellitus, type 2, in pregnancy, unspecified trimester: Secondary | ICD-10-CM

## 2023-07-22 MED ORDER — INSULIN GLARGINE 100 UNIT/ML SOLOSTAR PEN: 10.0000 [IU] | PEN_INJECTOR | Freq: Every day | SUBCUTANEOUS | 11 refills | Status: DC

## 2023-07-22 MED ORDER — INSULIN PEN NEEDLE 31G X 8 MM MISC: 1.0000 | Freq: Every day | 5 refills | Status: DC

## 2023-07-22 NOTE — Progress Notes (Signed)
                                                                                                                    Jasmine, alexander

## 2023-07-22 NOTE — Progress Notes (Signed)
   PRENATAL VISIT NOTE  Subjective:  Tami Crawford is a 38 y.o. [id number] at [redacted]w[redacted]d being seen today for ongoing prenatal care.  She is currently monitored for the following issues for this high-risk pregnancy and has History of gestational hypertension; Pre-existing type 2 diabetes mellitus during pregnancy, antepartum; Supervision of high risk pregnancy, antepartum; Advanced maternal age in multigravida; History of ectopic pregnancy; and Obesity affecting pregnancy, antepartum on their problem list.  Patient reports no complaints.  Contractions: Not present. Vag. Bleeding: None.  Movement: Present. Denies leaking of fluid.   The following portions of the patient's history were reviewed and updated as appropriate: allergies, current medications, past family history, past medical history, past social history, past surgical history and problem list.   Objective:   Vitals:   07/22/23 1522  BP: 134/76  Pulse: 100  Weight: 239 lb (108.4 kg)    Fetal Status: Fetal Heart Rate (bpm): 161   Movement: Present     General:  Alert, oriented and cooperative. Patient is in no acute distress.  Skin: Skin is warm and dry. No rash noted.   Cardiovascular: Normal heart rate noted  Respiratory: Normal respiratory effort, no problems with respiration noted  Abdomen: Soft, gravid, appropriate for gestational age.  Pain/Pressure: Absent     Pelvic: No pelvic exam was performed at this visit.  Extremities: Normal range of motion.  Edema: None  Mental Status: Normal mood and affect. Normal behavior. Normal judgment and thought content.   Assessment and Plan:  Pregnancy: G5P3013 at [redacted]w[redacted]d 1. Supervision of high risk pregnancy, antepartum (Primary) - Continue prenatal monitoring  2. Pre-existing type 2 diabetes mellitus during pregnancy, antepartum - Continue to monitor fasting and postprandial glucose levels. - Continue to monitor fetal growth - Ambulatory referral to Nutrition and Diabetic  Education - insulin glargine (LANTUS) 100 UNIT/ML Solostar Pen; Inject 10 Units into the skin daily with breakfast.  Dispense: 15 mL; Refill: 11   Media Information  Document Information  Photos    07/22/2023 15:44  Attached To:  Routine Prenatal on 07/22/23 with Adam Phenix, MD  Source Information  Adam Phenix, MD  [facility]  Document History    Preterm labor symptoms and general obstetric precautions including but not limited to vaginal bleeding, contractions, leaking of fluid and fetal movement were reviewed in detail with the patient. Please refer to After Visit Summary for other counseling recommendations.   No follow-ups on file.  Future Appointments  Date Time Provider Department Center  08/03/2023  2:15 PM [facility] NURSE [facility]  08/03/2023  2:30 PM [facility] US3 [facility]  08/09/2023  3:55 PM [place] Bing, MD [facility]    Kerrin Mo, Medical Student Attestation of Attending Supervision of Medical Student: Evaluation and management procedures were performed by the medical student under my supervision and collaboration.  I have reviewed the student's note and chart, and I agree with the management and plan.  Scheryl Darter, MD, FACOG Attending Obstetrician & Gynecologist Faculty Practice, [facility]

## 2023-07-29 ENCOUNTER — Ambulatory Visit: Admitting: Dietician

## 2023-07-29 ENCOUNTER — Encounter: Attending: Obstetrics and Gynecology | Admitting: Dietician

## 2023-07-29 DIAGNOSIS — O24119 Pre-existing diabetes mellitus, type 2, in pregnancy, unspecified trimester: Secondary | ICD-10-CM

## 2023-07-29 DIAGNOSIS — Z3A00 Weeks of gestation of pregnancy not specified: Secondary | ICD-10-CM | POA: Insufficient documentation

## 2023-07-29 DIAGNOSIS — Z713 Dietary counseling and surveillance: Secondary | ICD-10-CM | POA: Insufficient documentation

## 2023-07-29 DIAGNOSIS — Z3A23 23 weeks gestation of pregnancy: Secondary | ICD-10-CM

## 2023-07-29 NOTE — Progress Notes (Signed)
 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 Swallow whole., Disp: 30 tablet, Rfl: 12   Blood Glucose Monitoring Suppl (ACCU-CHEK GUIDE) w/Device KIT, 1 Device by Does not apply route in the morning, at noon, in the evening, and at bedtime., Disp: 1 kit, Rfl: 0   famotidine (PEPCID) 20 MG tablet, Take 1 tablet (20 mg total) by mouth 2 (two) times daily., Disp: 60 tablet, Rfl: 2   glucose blood test strip, Check blood glucose four times a day, Disp: 100 each, Rfl: 12   insulin glargine (LANTUS) 100 UNIT/ML Solostar Pen, Inject 10 Units into the skin daily with breakfast., Disp: 15 mL, Rfl: 11   Insulin Pen Needle 31G X 8 MM MISC, 1 Device by Does not apply route daily., Disp: 100 each, Rfl: 5   metFORMIN (GLUCOPHAGE) 1000 MG tablet, Take 1 tablet (1,000 mg total) by mouth 2 (two) times daily with a meal., Disp: 60 tablet, Rfl: 2   Prenatal Vit-Fe Fumarate-FA (PRENATAL PLUS VITAMIN/MINERAL) 27-1 MG TABS, Take 1 tablet by mouth daily., Disp: 30 tablet, Rfl: 11  Medical History:  Past Medical History:  Diagnosis Date   Ectopic pregnancy 04/18/2022   Methotrexate administered 04/18/2022  HCG <1 on 06/03/2022     Gestational diabetes    Gestational hypertension    Headache    History of gestational hypertension 03/15/2013   Language barrier 07/15/2021   Pre-existing type 2 diabetes mellitus during pregnancy, antepartum 06/10/2021   12/22: Hgb A1C 6.9 early pregnancy  7/23: 2 hour GTT PP           Glucose, GTT - Fasting    70 - 99 mg/dL    91       Glucose, 2 hour    70 - 139 mg/dL    914            Labs: OGTT: none  Lab Results   Component Value Date   HGBA1C 6.7 (H) 06/03/2023   Dietary and Lifestyle History: Pt present today with lantus insulin pen and pen needles. Pt reports she takes 1000 mg of metformin BID. Pt reports her blood sugar "97" this morning before breakfast and 2 hour after breakfast "145" mg/dL. Pt reports she continues to monitor her blood sugar QID and states her meter is at home. Pt states plan to start her insulin tomorrow morning. Pt was able to accurately and perform a 10 units mock injections using demonstration tools during this encounter. RD encouraged Pt to avoids skipped meals and snacks and to carry a fasting acting sugar sources with her at all times.  All Pt's questions were answered during this encounter.   Physical Activity: walking 3 days for 10-15 minutes  Stress: none Sleep: good  24 hr Recall:  First Meal:  4-6 inch baguette bread and 3 eggs, water (145 mg/dL) Snack:  none Second meal:  rice, spinach, milk Snack:  none Third meal:  chicken and rice Snack:   4 cookies, water Beverages:  water  NUTRITION INTERVENTION  Nutrition education (E-1) on the following topics:   Initial Follow-up  []  []  Definition of Gestational Diabetes []  [x]  Why dietary  management is important in controlling blood glucose []  [x]  Effects each nutrient has on blood glucose levels []  []  Simple carbohydrates vs complex carbohydrates []  []  Fluid intake []  []  Creating a balanced meal plan []  []  Carbohydrate counting  []  [x]  When to check blood glucose levels []  []  Proper blood glucose monitoring techniques []  []  Effect of stress and stress reduction techniques  []  []  Exercise effect on blood glucose levels, appropriate exercise during pregnancy []  []  Importance of limiting caffeine and abstaining from alcohol and smoking []  [x]  Medications used for blood sugar control during pregnancy []  [x]  Hypoglycemia and rule of 15 []  []  Postpartum self care  Patient has a meter prior to visit. Patient is  instructed to continue to testing pre breakfast and 2 hours after each meal. CBG: 154 mg/dL, reported as pre meal per Pt   Patient instructed to monitor glucose levels: QID FBS: 60 - <= 95 mg/dL; 2 hour: <= 161 mg/dL  Patient received handouts: Nutrition Diabetes and Pregnancy Carbohydrate Counting List Blood glucose log Snack ideas for diabetes during pregnancy  Patient will be seen for follow-up as needed.

## 2023-08-03 ENCOUNTER — Ambulatory Visit: Payer: Medicaid Other | Attending: Obstetrics and Gynecology

## 2023-08-03 ENCOUNTER — Ambulatory Visit: Payer: Medicaid Other

## 2023-08-09 ENCOUNTER — Encounter: Payer: Self-pay | Admitting: Obstetrics and Gynecology

## 2023-08-09 ENCOUNTER — Ambulatory Visit (INDEPENDENT_AMBULATORY_CARE_PROVIDER_SITE_OTHER): Admitting: Obstetrics and Gynecology

## 2023-08-09 VITALS — BP 114/69 | HR 115 | Wt 234.0 lb

## 2023-08-09 DIAGNOSIS — O09522 Supervision of elderly multigravida, second trimester: Secondary | ICD-10-CM | POA: Diagnosis not present

## 2023-08-09 DIAGNOSIS — Z8759 Personal history of other complications of pregnancy, childbirth and the puerperium: Secondary | ICD-10-CM

## 2023-08-09 DIAGNOSIS — Z3A25 25 weeks gestation of pregnancy: Secondary | ICD-10-CM | POA: Diagnosis not present

## 2023-08-09 DIAGNOSIS — O24119 Pre-existing diabetes mellitus, type 2, in pregnancy, unspecified trimester: Secondary | ICD-10-CM | POA: Diagnosis not present

## 2023-08-09 MED ORDER — INSULIN GLARGINE 100 UNIT/ML SOLOSTAR PEN: 13.0000 [IU] | PEN_INJECTOR | Freq: Every day | SUBCUTANEOUS | Status: DC

## 2023-08-09 NOTE — Progress Notes (Unsigned)
    PRENATAL VISIT NOTE  Subjective:  Tami Crawford is a 38 y.o. [id number] at [redacted]w[redacted]d being seen today for ongoing prenatal care.  She is currently monitored for the following issues for this high-risk pregnancy and has History of gestational hypertension; Pre-existing type 2 diabetes mellitus during pregnancy, antepartum; Supervision of high risk pregnancy, antepartum; Advanced maternal age in multigravida; and Obesity affecting pregnancy, antepartum on their problem list.  Patient reports no complaints.  Contractions: Not present. Vag. Bleeding: None.  Movement: Present. Denies leaking of fluid.   The following portions of the patient's history were reviewed and updated as appropriate: allergies, current medications, past family history, past medical history, past social history, past surgical history and problem list.   Objective:   Vitals:   08/09/23 1610  BP: 114/69  Pulse: (!) 115  Weight: 234 lb (106.1 kg)    Fetal Status: Fetal Heart Rate (bpm): 164   Movement: Present     General:  Alert, oriented and cooperative. Patient is in no acute distress.  Skin: Skin is warm and dry. No rash noted.   Cardiovascular: Normal heart rate noted  Respiratory: Normal respiratory effort, no problems with respiration noted  Abdomen: Soft, gravid, appropriate for gestational age.  Pain/Pressure: Absent     Pelvic: Cervical exam deferred        Extremities: Normal range of motion.  Edema: None  Mental Status: Normal mood and affect. Normal behavior. Normal judgment and thought content.   Assessment and Plan:  Pregnancy: G5P3013 at 25wd 1. [redacted] weeks gestation of pregnancy (Primary) Routine care. Ask about birth control next visit. 28wk labs next visit  2. Pre-existing type 2 diabetes mellitus during pregnancy, antepartum Patient currently on metformin 1000 with breakfast and at bedtime and lantus 10 with breakfast.  Fastings wnl 2h PP breakfast: 135/140/133/120/151/130/wnl 2h PP lunch:  145/160/150/140/ wnl 2h PP dinner: --/160/168/165/160/120/140/160/170/150/145/137  Recommend she increase her 10 with breakfast to 13 Fetal echo wnl 3/26 Normal growth and anatomy on 2/25. Follow up serial scans; patient no show'ed to 4/1. CMA to reschedule - insulin glargine (LANTUS) 100 UNIT/ML Solostar Pen; Inject 13 Units into the skin daily with breakfast.  3. Multigravida of advanced maternal age in second trimester Panorama low risk  4. History of gestational hypertension Continue low dose ASA  Preterm labor symptoms and general obstetric precautions including but not limited to vaginal bleeding, contractions, leaking of fluid and fetal movement were reviewed in detail with the patient. Please refer to After Visit Summary for other counseling recommendations.   Return in about 10 days (around 08/19/2023) for in person, md visit, high risk ob.  Future Appointments  Date Time Provider Department Center  08/23/2023  3:55 PM Milas Hock, MD [facility]    [place] Bing, MD

## 2023-08-23 ENCOUNTER — Encounter: Admitting: Obstetrics and Gynecology

## 2023-08-24 ENCOUNTER — Other Ambulatory Visit: Payer: Self-pay | Admitting: *Deleted

## 2023-08-24 ENCOUNTER — Ambulatory Visit: Attending: Obstetrics and Gynecology

## 2023-08-24 ENCOUNTER — Ambulatory Visit (HOSPITAL_BASED_OUTPATIENT_CLINIC_OR_DEPARTMENT_OTHER): Admitting: Maternal & Fetal Medicine

## 2023-08-24 VITALS — BP 110/68 | HR 116

## 2023-08-24 DIAGNOSIS — O99212 Obesity complicating pregnancy, second trimester: Secondary | ICD-10-CM | POA: Diagnosis not present

## 2023-08-24 DIAGNOSIS — O24119 Pre-existing diabetes mellitus, type 2, in pregnancy, unspecified trimester: Secondary | ICD-10-CM | POA: Diagnosis present

## 2023-08-24 DIAGNOSIS — O24112 Pre-existing diabetes mellitus, type 2, in pregnancy, second trimester: Secondary | ICD-10-CM

## 2023-08-24 DIAGNOSIS — O09522 Supervision of elderly multigravida, second trimester: Secondary | ICD-10-CM

## 2023-08-24 DIAGNOSIS — E119 Type 2 diabetes mellitus without complications: Secondary | ICD-10-CM | POA: Diagnosis not present

## 2023-08-24 DIAGNOSIS — O3663X0 Maternal care for excessive fetal growth, third trimester, not applicable or unspecified: Secondary | ICD-10-CM

## 2023-08-24 DIAGNOSIS — Z3A27 27 weeks gestation of pregnancy: Secondary | ICD-10-CM | POA: Diagnosis present

## 2023-08-24 DIAGNOSIS — O0990 Supervision of high risk pregnancy, unspecified, unspecified trimester: Secondary | ICD-10-CM | POA: Insufficient documentation

## 2023-08-24 DIAGNOSIS — E669 Obesity, unspecified: Secondary | ICD-10-CM

## 2023-08-24 DIAGNOSIS — O09292 Supervision of pregnancy with other poor reproductive or obstetric history, second trimester: Secondary | ICD-10-CM

## 2023-08-24 DIAGNOSIS — O3660X0 Maternal care for excessive fetal growth, unspecified trimester, not applicable or unspecified: Secondary | ICD-10-CM

## 2023-08-24 NOTE — Progress Notes (Addendum)
 Patient information  Patient Name: Tami Crawford  Patient MRN:   [id number]  Referring practice: MFM Referring Provider: [facility])  MFM CONSULT  KAELAH HAYASHI is a 38 y.o. [phone] at [redacted]w[redacted]d here for ultrasound and consultation. Patient Active Problem List   Diagnosis Date Noted   Obesity affecting pregnancy, antepartum 06/24/2023   Supervision of high risk pregnancy, antepartum 06/03/2023   Advanced maternal age in multigravida 06/03/2023   Pre-existing type 2 diabetes mellitus during pregnancy, antepartum 06/10/2021   History of gestational hypertension 03/15/2013    Tami Crawford is doing well today with no acute concerns.  RE type 2 diabetes: The patient's fasting blood sugars are mostly well-controlled with only 4 out of 14 readings above goal.  The majority of her postprandial blood sugars are slightly above the goal of 120.  I recommend she increase her Lantus  from 13 units to 16 units daily.  She should also continue her metformin .  I discussed that the ultrasound shows a fetal biometry in the 98 percentile which is another sign of poor control.  This may alter the delivery timing necessitating a 38-week delivery or a sooner if indicated.  Sonographic findings Single intrauterine pregnancy. Fetal cardiac activity: Observed. Presentation: Cephalic. Interval fetal anatomy appears normal. Fetal biometry shows the estimated fetal weight at the 98 percentile. Amniotic fluid: Within normal limits.  MVP: 6.78 cm. Placenta: Anterior.  There are limitations of prenatal ultrasound such as the inability to detect certain abnormalities due to poor visualization. Various factors such as fetal position, gestational age and maternal body habitus may increase the difficulty in visualizing the fetal anatomy.   Recommendations -Serial growth ultrasounds every 4-6 weeks until delivery -Antenatal testing to start around 32 weeks  -Delivery around  [redacted] weeks gestation -Increase Lantus  from 13 units to 16 units  Review of Systems: A review of systems was performed and was negative except per HPI   Vitals and Physical Exam    08/24/2023    8:13 AM 08/09/2023    4:10 PM 07/22/2023    3:22 PM  Vitals with BMI  Weight  234 lbs 239 lbs  Systolic 110 114 308  Diastolic 68 69 76  Pulse 116 115 100    Sitting comfortably on the sonogram table Nonlabored breathing Normal rate and rhythm Abdomen is nontender  Past pregnancies OB History  Gravida Para Term Preterm AB Living  5 3 3  0 1 3  SAB IAB Ectopic Multiple Live Births  0 0 1 0 3    # Outcome Date GA Lbr Len/2nd Weight Sex Type Anes PTL Lv  5 Current           4 Ectopic 2024          3 Term 09/29/21 [redacted]w[redacted]d 02:27 / 02:25 8 lb 6.4 oz (3.81 kg) M Vag-Spont EPI  LIV     Birth Comments: WNL     Complications: Gestational hypertension, GDM (gestational diabetes mellitus)  2 Term 01/26/15 [redacted]w[redacted]d 09:16 / 00:29 7 lb 13.2 oz (3.549 kg) M Vag-Spont EPI  LIV  1 Term 03/16/13 [redacted]w[redacted]d 08:47 / 01:54 7 lb 6.5 oz (3.359 kg) M Vag-Spont EPI, Local  LIV    I spent 20 minutes reviewing the patients chart, including labs and images as well as counseling the patient about her medical conditions. Greater than 50% of the time was spent in direct face-to-face patient counseling.  Penney Bowling  MFM, [facility]   08/24/2023  8:47 AM

## 2023-09-14 ENCOUNTER — Ambulatory Visit: Admitting: Certified Nurse Midwife

## 2023-09-14 VITALS — BP 106/72 | HR 72 | Wt 252.2 lb

## 2023-09-14 DIAGNOSIS — Z758 Other problems related to medical facilities and other health care: Secondary | ICD-10-CM

## 2023-09-14 DIAGNOSIS — O24113 Pre-existing diabetes mellitus, type 2, in pregnancy, third trimester: Secondary | ICD-10-CM

## 2023-09-14 DIAGNOSIS — O24119 Pre-existing diabetes mellitus, type 2, in pregnancy, unspecified trimester: Secondary | ICD-10-CM

## 2023-09-14 DIAGNOSIS — O26899 Other specified pregnancy related conditions, unspecified trimester: Secondary | ICD-10-CM

## 2023-09-14 DIAGNOSIS — Z603 Acculturation difficulty: Secondary | ICD-10-CM

## 2023-09-14 DIAGNOSIS — O09523 Supervision of elderly multigravida, third trimester: Secondary | ICD-10-CM

## 2023-09-14 DIAGNOSIS — O26893 Other specified pregnancy related conditions, third trimester: Secondary | ICD-10-CM

## 2023-09-14 DIAGNOSIS — Z3A30 30 weeks gestation of pregnancy: Secondary | ICD-10-CM | POA: Diagnosis not present

## 2023-09-14 DIAGNOSIS — O09522 Supervision of elderly multigravida, second trimester: Secondary | ICD-10-CM

## 2023-09-14 DIAGNOSIS — O0990 Supervision of high risk pregnancy, unspecified, unspecified trimester: Secondary | ICD-10-CM

## 2023-09-14 DIAGNOSIS — R12 Heartburn: Secondary | ICD-10-CM

## 2023-09-14 DIAGNOSIS — O0993 Supervision of high risk pregnancy, unspecified, third trimester: Secondary | ICD-10-CM | POA: Diagnosis not present

## 2023-09-14 MED ORDER — METFORMIN HCL 1000 MG PO TABS
1000.0000 mg | ORAL_TABLET | Freq: Two times a day (BID) | ORAL | 2 refills | Status: DC
Start: 2023-09-14 — End: 2023-11-03

## 2023-09-14 MED ORDER — FAMOTIDINE 20 MG PO TABS: 20.0000 mg | ORAL_TABLET | Freq: Two times a day (BID) | ORAL | 2 refills | Status: DC

## 2023-09-14 NOTE — Progress Notes (Signed)
   PRENATAL VISIT NOTE  Subjective:  Tami Crawford is a 38 y.o. U9W1191 at [redacted]w[redacted]d being seen today for ongoing prenatal care.  She is currently monitored for the following issues for this high-risk pregnancy and has History of gestational hypertension; Pre-existing type 2 diabetes mellitus during pregnancy, antepartum; Supervision of high risk pregnancy, antepartum; Advanced maternal age in multigravida; and Obesity affecting pregnancy, antepartum on their problem list.  Patient reports no complaints.  Contractions: Not present. Vag. Bleeding: None.  Movement: Present. Denies leaking of fluid.   The following portions of the patient's history were reviewed and updated as appropriate: allergies, current medications, past family history, past medical history, past social history, past surgical history and problem list.   Objective:   Vitals:   09/14/23 1654  BP: 106/72  Pulse: 72  Weight: 252 lb 3.2 oz (114.4 kg)     Fetal Status: Fetal Heart Rate (bpm): 161 Fundal Height: 32 cm Movement: Present      General: Alert, oriented and cooperative. Patient is in no acute distress.  Skin: Skin is warm and dry. No rash noted.   Cardiovascular: Normal heart rate noted  Respiratory: Normal respiratory effort, no problems with respiration noted  Abdomen: Soft, gravid, appropriate for gestational age.  Pain/Pressure: Absent     Pelvic: Cervical exam deferred        Extremities: Normal range of motion.  Edema: Mild pitting, slight indentation  Mental Status: Normal mood and affect. Normal behavior. Normal judgment and thought content.   Assessment and Plan:  Pregnancy: [id number] at [redacted]w[redacted]d 1. Supervision of high risk pregnancy, antepartum (Primary) - Doing well, feeling regular and vigorous fetal movement  2. [redacted] weeks gestation of pregnancy - Anticipatory guidance regarding upcoming care and changes in pregnancy.  3. Multigravida of advanced maternal age in second trimester - Followed by MFM.    4. Pre-existing type 2 diabetes mellitus during pregnancy, antepartum - Reviewed log all readings <150,  - Under media tab.  5. Language barrier affecting health care - Patient speaks excellent English, declines interpreter.   Preterm labor symptoms and general obstetric precautions including but not limited to vaginal bleeding, contractions, leaking of fluid and fetal movement were reviewed in detail with the patient. Please refer to After Visit Summary for other counseling recommendations.   Return in about 2 weeks (around 09/28/2023) for HROB.  Future Appointments  Date Time Provider Department Center  09/29/2023  3:00 PM [facility] PROVIDER 1 [facility]  09/29/2023  3:30 PM [facility] US4 [facility]  10/06/2023 10:55 AM Granville Layer, MD [facility]  10/06/2023  3:00 PM [facility] NURSE [facility]  10/06/2023  3:15 PM [facility] NST [facility]  10/13/2023  3:00 PM [facility] NURSE [facility]  10/13/2023  3:15 PM [facility] NST [facility]  10/20/2023 10:55 AM Granville Layer, MD [facility]  10/20/2023  3:00 PM [facility] NURSE [facility]  10/20/2023  3:15 PM [facility] NST [facility]  10/26/2023  3:55 PM Lacey Pian, MD [facility]  10/27/2023  3:00 PM [facility] PROVIDER 1 [facility]  10/27/2023  3:30 PM [facility] US2 [facility]    Salomon Cree, CNM

## 2023-09-29 ENCOUNTER — Ambulatory Visit: Attending: Maternal & Fetal Medicine | Admitting: Obstetrics

## 2023-09-29 ENCOUNTER — Ambulatory Visit

## 2023-09-29 VITALS — BP 105/63 | HR 105

## 2023-09-29 DIAGNOSIS — Z794 Long term (current) use of insulin: Secondary | ICD-10-CM | POA: Insufficient documentation

## 2023-09-29 DIAGNOSIS — O3663X0 Maternal care for excessive fetal growth, third trimester, not applicable or unspecified: Secondary | ICD-10-CM

## 2023-09-29 DIAGNOSIS — Z7984 Long term (current) use of oral hypoglycemic drugs: Secondary | ICD-10-CM | POA: Insufficient documentation

## 2023-09-29 DIAGNOSIS — O09293 Supervision of pregnancy with other poor reproductive or obstetric history, third trimester: Secondary | ICD-10-CM | POA: Diagnosis not present

## 2023-09-29 DIAGNOSIS — Z3A32 32 weeks gestation of pregnancy: Secondary | ICD-10-CM | POA: Insufficient documentation

## 2023-09-29 DIAGNOSIS — E119 Type 2 diabetes mellitus without complications: Secondary | ICD-10-CM | POA: Diagnosis not present

## 2023-09-29 DIAGNOSIS — O3660X0 Maternal care for excessive fetal growth, unspecified trimester, not applicable or unspecified: Secondary | ICD-10-CM

## 2023-09-29 DIAGNOSIS — O24119 Pre-existing diabetes mellitus, type 2, in pregnancy, unspecified trimester: Secondary | ICD-10-CM

## 2023-09-29 DIAGNOSIS — O99213 Obesity complicating pregnancy, third trimester: Secondary | ICD-10-CM

## 2023-09-29 DIAGNOSIS — E669 Obesity, unspecified: Secondary | ICD-10-CM

## 2023-09-29 DIAGNOSIS — O99210 Obesity complicating pregnancy, unspecified trimester: Secondary | ICD-10-CM

## 2023-09-29 DIAGNOSIS — O24319 Unspecified pre-existing diabetes mellitus in pregnancy, unspecified trimester: Secondary | ICD-10-CM

## 2023-09-29 DIAGNOSIS — O09523 Supervision of elderly multigravida, third trimester: Secondary | ICD-10-CM | POA: Insufficient documentation

## 2023-09-29 DIAGNOSIS — O09522 Supervision of elderly multigravida, second trimester: Secondary | ICD-10-CM

## 2023-09-29 DIAGNOSIS — O24113 Pre-existing diabetes mellitus, type 2, in pregnancy, third trimester: Secondary | ICD-10-CM | POA: Insufficient documentation

## 2023-09-29 DIAGNOSIS — O403XX0 Polyhydramnios, third trimester, not applicable or unspecified: Secondary | ICD-10-CM | POA: Diagnosis not present

## 2023-09-29 DIAGNOSIS — O24112 Pre-existing diabetes mellitus, type 2, in pregnancy, second trimester: Secondary | ICD-10-CM

## 2023-09-29 DIAGNOSIS — O0990 Supervision of high risk pregnancy, unspecified, unspecified trimester: Secondary | ICD-10-CM

## 2023-09-29 DIAGNOSIS — Z362 Encounter for other antenatal screening follow-up: Secondary | ICD-10-CM | POA: Insufficient documentation

## 2023-09-29 NOTE — Progress Notes (Signed)
 MFM Consult Note  Tami Crawford is currently at 32 weeks and 2 days.  She has been followed due to pregestational diabetes treated with insulin  and metformin , advanced maternal age, and maternal obesity.  Polyhydramnios was noted on her prior ultrasound exams.    She denies any problems since her last exam and reports that her glycemic control has been within normal limits.  On today's exam, the overall EFW of 5 pounds 14 ounces measures at greater than the 99th percentile for her gestational age.    Polyhydramnios with a total AFI of 25.52 cm is noted.    Due to her underlying medical conditions and the large for gestational age fetus, we will continue to follow her with weekly fetal testing until delivery.    Due to polyhydramnios and the large for gestational age fetus along with pregestational diabetes, delivery should be considered at between 26 to 38 weeks.    She will return in 1 week for an NST.    The patient stated that all of her questions were answered today.  A total of 20 minutes was spent counseling and coordinating the care for this patient.  Greater than 50% of the time was spent in direct face-to-face contact.

## 2023-10-06 ENCOUNTER — Ambulatory Visit: Admitting: Family Medicine

## 2023-10-06 ENCOUNTER — Ambulatory Visit

## 2023-10-06 VITALS — BP 107/73 | HR 107 | Wt 252.0 lb

## 2023-10-06 DIAGNOSIS — O09523 Supervision of elderly multigravida, third trimester: Secondary | ICD-10-CM

## 2023-10-06 DIAGNOSIS — O24119 Pre-existing diabetes mellitus, type 2, in pregnancy, unspecified trimester: Secondary | ICD-10-CM

## 2023-10-06 DIAGNOSIS — Z3A33 33 weeks gestation of pregnancy: Secondary | ICD-10-CM

## 2023-10-06 DIAGNOSIS — O0990 Supervision of high risk pregnancy, unspecified, unspecified trimester: Secondary | ICD-10-CM

## 2023-10-06 NOTE — Progress Notes (Signed)
   PRENATAL VISIT NOTE  Subjective:  Tami Crawford is a 38 y.o. U9W1191 at [redacted]w[redacted]d being seen today for ongoing prenatal care.  She is currently monitored for the following issues for this high-risk pregnancy and has History of gestational hypertension; Pre-existing type 2 diabetes mellitus during pregnancy, antepartum; Supervision of high risk pregnancy, antepartum; Advanced maternal age in multigravida; and Obesity affecting pregnancy, antepartum on their problem list.  Patient reports no complaints.  Contractions: Not present. Vag. Bleeding: None.  Movement: Present. Denies leaking of fluid.   The following portions of the patient's history were reviewed and updated as appropriate: allergies, current medications, past family history, past medical history, past social history, past surgical history and problem list.   Objective:    Vitals:   10/06/23 1113  BP: 107/73  Pulse: (!) 107  Weight: 252 lb (114.3 kg)    Fetal Status:  Fetal Heart Rate (bpm): 148 Fundal Height: 37 cm Movement: Present    General: Alert, oriented and cooperative. Patient is in no acute distress.  Skin: Skin is warm and dry. No rash noted.   Cardiovascular: Normal heart rate noted  Respiratory: Normal respiratory effort, no problems with respiration noted  Abdomen: Soft, gravid, appropriate for gestational age.  Pain/Pressure: Absent     Pelvic: Cervical exam deferred        Extremities: Normal range of motion.  Edema: None  Mental Status: Normal mood and affect. Normal behavior. Normal judgment and thought content.   Assessment and Plan:  Pregnancy: G5P3013 at [redacted]w[redacted]d 1. [redacted] weeks gestation of pregnancy   2. Pre-existing type 2 diabetes mellitus during pregnancy, antepartum No blood sugar Reports fasting today is 92 EFW is > 99% + poly Will need IOL @ 37 weeks  3. Supervision of high risk pregnancy, antepartum (Primary)   4. Multigravida of advanced maternal age in third trimester LR  NIPT  Preterm labor symptoms and general obstetric precautions including but not limited to vaginal bleeding, contractions, leaking of fluid and fetal movement were reviewed in detail with the patient. Please refer to After Visit Summary for other counseling recommendations.   Return in 1 week (on 10/13/2023) for OB visit and BPP.  Future Appointments  Date Time Provider Department Center  10/06/2023  3:00 PM [facility] NURSE [facility]  10/06/2023  3:15 PM [facility] NST [facility]  10/13/2023  3:00 PM [facility] NURSE [facility]  10/13/2023  3:15 PM [facility] NST [facility]  10/20/2023 10:55 AM Granville Layer, MD [facility]  10/20/2023  3:00 PM [facility] NURSE [facility]  10/20/2023  3:15 PM [facility] NST [facility]  10/26/2023  3:55 PM Lacey Pian, MD [facility]  10/27/2023  3:00 PM [facility] PROVIDER 1 [facility]  10/27/2023  3:30 PM [facility] US2 [facility]    Granville Layer, MD

## 2023-10-09 IMAGING — US US MFM OB FOLLOW-UP
1 series · 14 of 28 positions shown · non-contrast
Comparison: none

[Series 1: us mfm ob follow-up · 55 acquisitions, 14 frames shown]
[im 3/55]
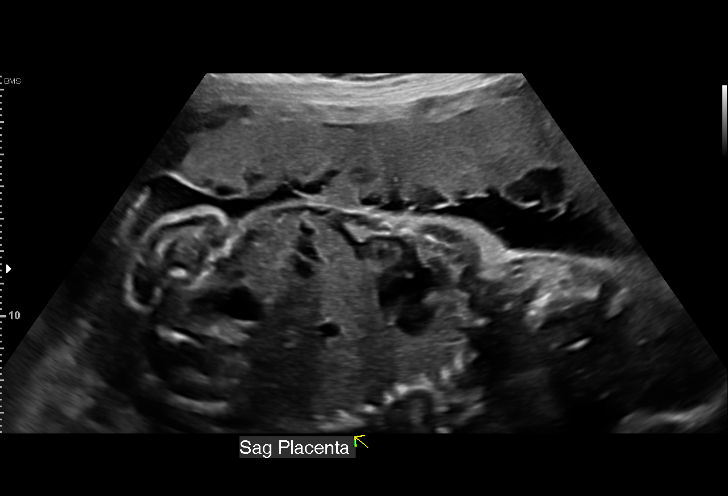
[im 7/55]
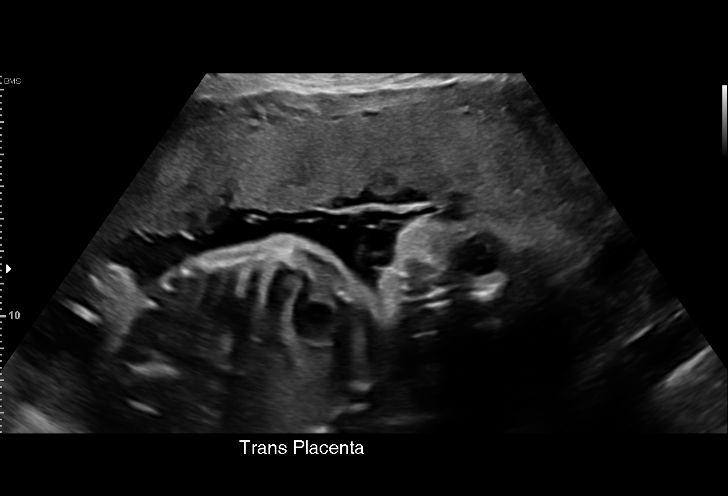
[im 11/55]
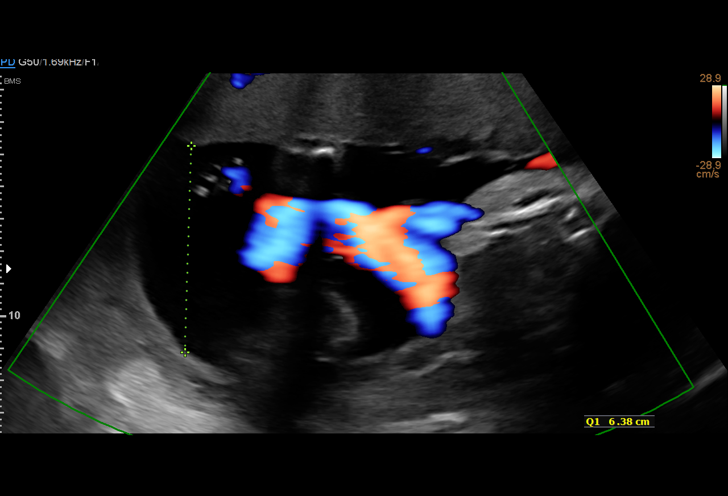
[im 15/55]
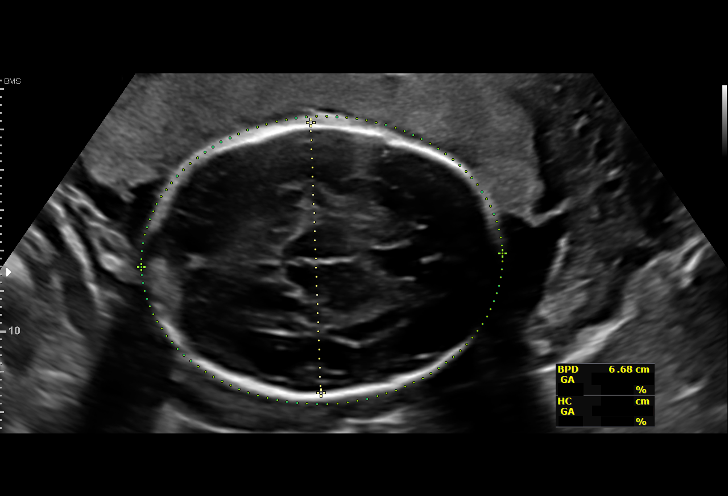
[im 19/55]
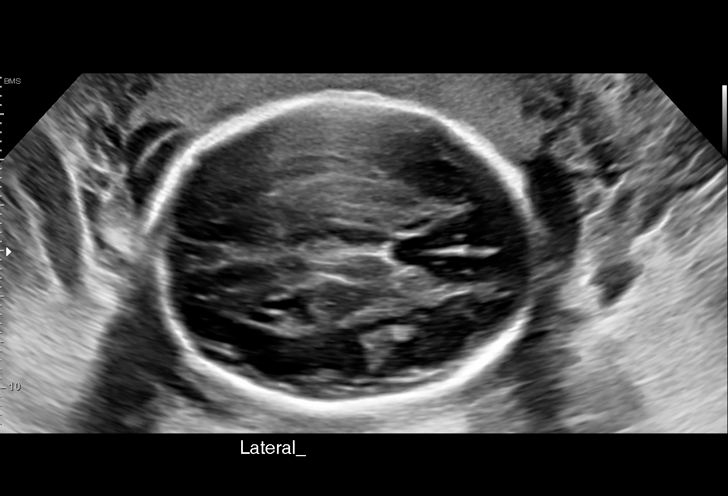
[im 23/55]
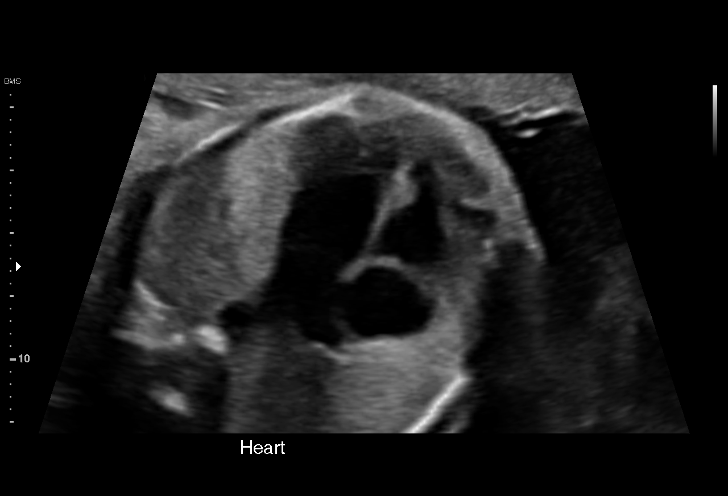
[im 27/55]
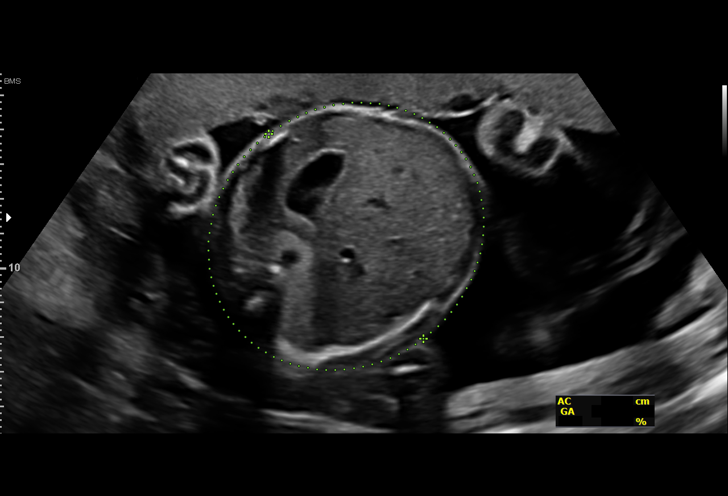
[im 31/55]
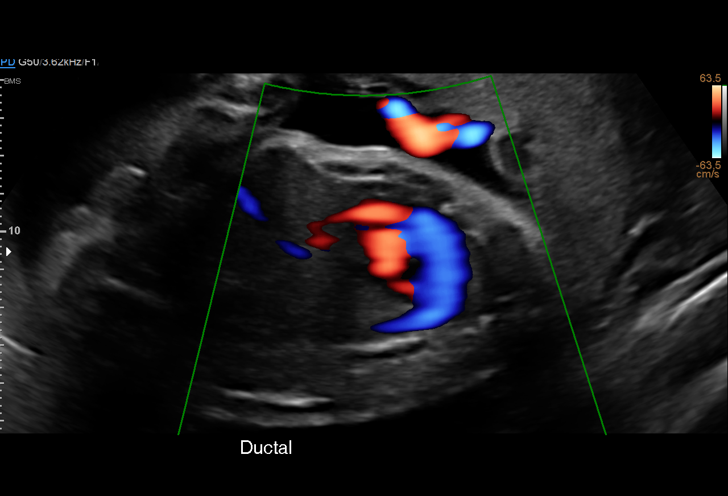
[im 35/55]
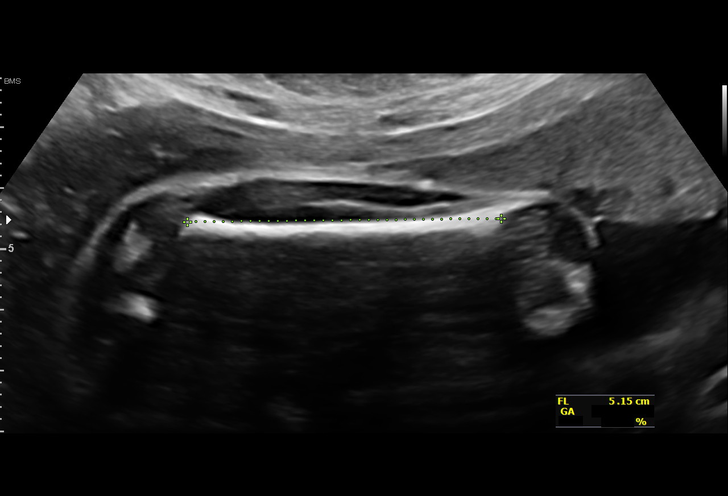
[im 39/55]
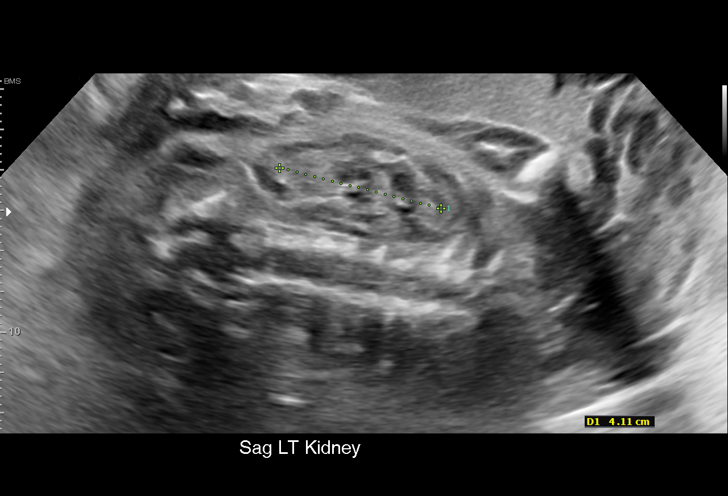
[im 43/55]
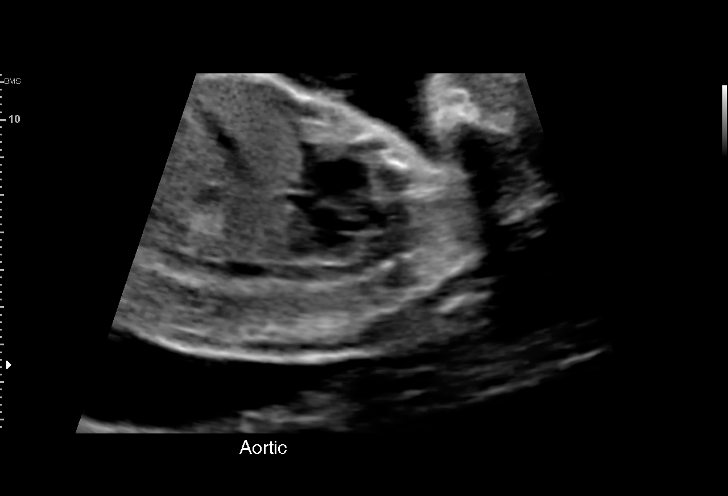
[im 47/55]
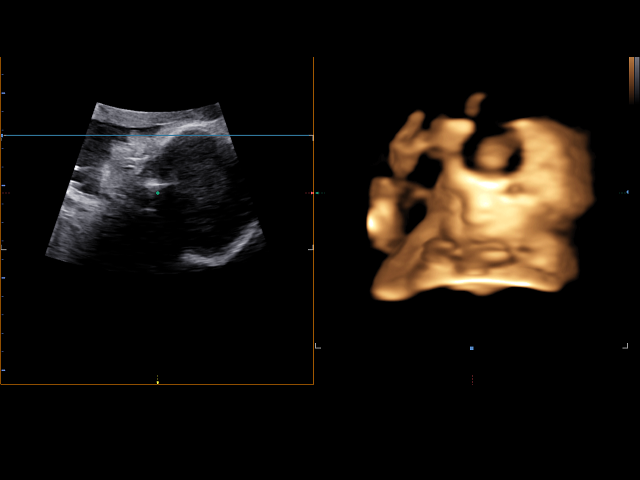
[im 51/55]
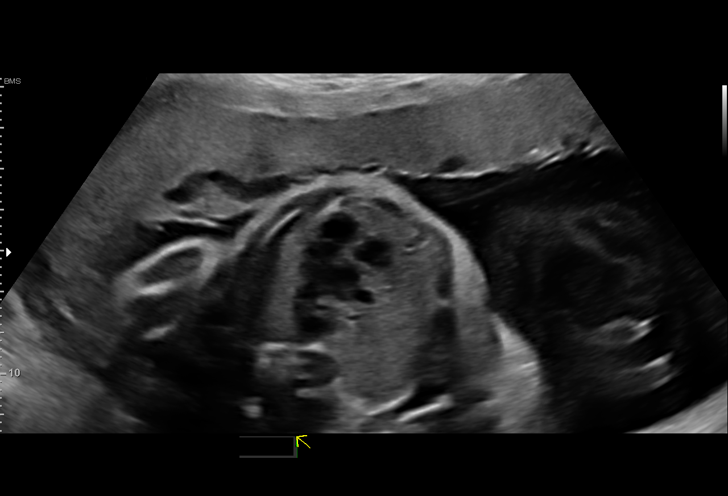
[im 55/55]
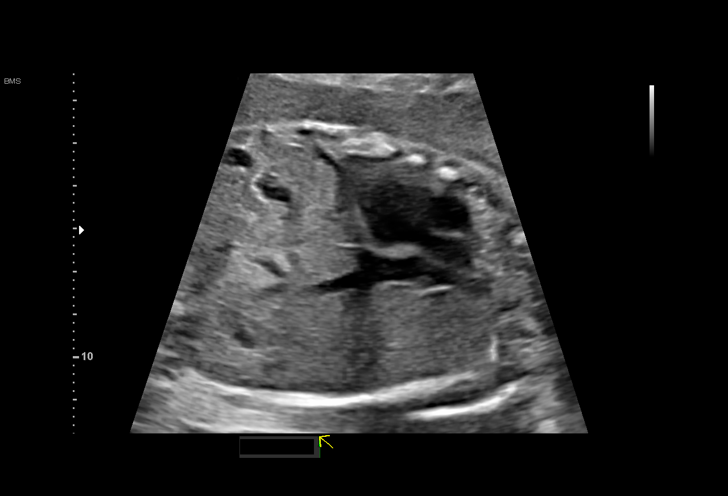

[14 of 28 positions shown; findings below may reference images not displayed]

BEARD DO

Indications

 Pre-existing diabetes, type 2, in pregnancy,
 second trimester
 Obesity complicating pregnancy, second
 trimester (BMI 36.01)
 Poor obstetric history: Previous
 preeclampsia / eclampsia/gestational HTN
 LR NIPS/Negative Horizon
 27 weeks gestation of pregnancy
Fetal Evaluation

 Num Of Fetuses:         1
 Fetal Heart Rate(bpm):  147
 Cardiac Activity:       Observed
 Presentation:           Cephalic
 Placenta:               Anterior
 P. Cord Insertion:      Visualized, central

 Amniotic Fluid
 AFI FV:      Within normal limits

 AFI Sum(cm)     %Tile       Largest Pocket(cm)
 6.38            < 3

 RUQ(cm)

Biometry
 BPD:      67.3  mm     G. Age:  27w 1d         37  %    CI:        72.95   %    70 - 86
                                                         FL/HC:      20.8   %    18.6 -
 HC:      250.5  mm     G. Age:  27w 1d         24  %    HC/AC:      1.01        1.05 -
 AC:      246.8  mm     G. Age:  28w 6d         89  %    FL/BPD:     77.6   %    71 - 87
 FL:       52.2  mm     G. Age:  27w 6d         56  %    FL/AC:      21.2   %    20 - 24
 LV:        1.9  mm

 Est. FW:    4422  gm    2 lb 10 oz      81  %
OB History

 Blood Type:   O+
 Gravidity:    3         Term:   2
 Living:       2
Gestational Age

 LMP:           26w 1d        Date:  01/13/21                 EDD:   10/20/21
 U/S Today:     27w 5d                                        EDD:   10/09/21
 Best:          27w 1d     Det. By:  Early Exam  (03/11/21)   EDD:   10/13/21
Anatomy

 Cranium:               Appears normal         LVOT:                   Appears normal
 Cavum:                 Appears normal         Aortic Arch:            Appears normal
 Ventricles:            Appears normal         Ductal Arch:            Appears normal
 Choroid Plexus:        Previously seen        Diaphragm:              Appears normal
 Cerebellum:            Previously seen        Stomach:                Appears normal, left
                                                                       sided
 Posterior Fossa:       Previously seen        Abdomen:                Previously seen
 Nuchal Fold:           Previously seen        Abdominal Wall:         Previously seen
 Face:                  Profile nl; orbits     Cord Vessels:           Previously seen
                        prev visualized
 Lips:                  Previously seen        Kidneys:                Appear normal
 Palate:                Not well visualized    Bladder:                Appears normal
 Thoracic:              Previously seen        Spine:                  Previously seen
 Heart:                 Appears normal         Upper Extremities:      Previously seen
                        (4CH, axis, and
                        situs)
 RVOT:                  Appears normal         Lower Extremities:      Previously seen

 Other:  Male gender previously seen.VC, 3VV and 3VTV, Heels/feet and
         open hands/5th digits, Nasal bone previously visualized.
Cervix Uterus Adnexa

 Cervix
 Not visualized (advanced GA >56wks)

 Uterus
 No abnormality visualized.

 Right Ovary
 Not visualized.
 Left Ovary
 Not visualized.

 Cul De Sac
 No free fluid seen.

 Adnexa
 No adnexal mass visualized.
Impression

 Follow up growth due T2DM
 Normal interval growth with measurements consistent with
 dates
 Good fetal movement and amniotic fluid volume

 A normal fetal echo was reported on 06/16/21 with Dr.
 Caleb at [facility].

 Blood sugars are reported as well controlled by Dr. Tiger
 today.
Recommendations

 Follow up growth in 5 weeks.
 Initiate weekly testing at 32 weeks.

## 2023-10-12 ENCOUNTER — Other Ambulatory Visit: Payer: Self-pay

## 2023-10-12 DIAGNOSIS — O09523 Supervision of elderly multigravida, third trimester: Secondary | ICD-10-CM

## 2023-10-12 DIAGNOSIS — O0990 Supervision of high risk pregnancy, unspecified, unspecified trimester: Secondary | ICD-10-CM

## 2023-10-12 DIAGNOSIS — O24119 Pre-existing diabetes mellitus, type 2, in pregnancy, unspecified trimester: Secondary | ICD-10-CM

## 2023-10-13 ENCOUNTER — Ambulatory Visit

## 2023-10-13 ENCOUNTER — Other Ambulatory Visit: Payer: Self-pay

## 2023-10-13 DIAGNOSIS — Z3A34 34 weeks gestation of pregnancy: Secondary | ICD-10-CM | POA: Diagnosis not present

## 2023-10-13 DIAGNOSIS — O09523 Supervision of elderly multigravida, third trimester: Secondary | ICD-10-CM

## 2023-10-13 DIAGNOSIS — O24113 Pre-existing diabetes mellitus, type 2, in pregnancy, third trimester: Secondary | ICD-10-CM | POA: Diagnosis not present

## 2023-10-13 DIAGNOSIS — O0993 Supervision of high risk pregnancy, unspecified, third trimester: Secondary | ICD-10-CM | POA: Diagnosis not present

## 2023-10-13 DIAGNOSIS — O0990 Supervision of high risk pregnancy, unspecified, unspecified trimester: Secondary | ICD-10-CM

## 2023-10-13 DIAGNOSIS — O24119 Pre-existing diabetes mellitus, type 2, in pregnancy, unspecified trimester: Secondary | ICD-10-CM

## 2023-10-20 ENCOUNTER — Ambulatory Visit: Admitting: *Deleted

## 2023-10-20 ENCOUNTER — Encounter: Admitting: Family Medicine

## 2023-10-20 ENCOUNTER — Ambulatory Visit: Attending: Maternal & Fetal Medicine | Admitting: *Deleted

## 2023-10-20 ENCOUNTER — Encounter: Payer: Self-pay | Admitting: Family Medicine

## 2023-10-20 VITALS — BP 128/75 | HR 102

## 2023-10-20 DIAGNOSIS — Z3A35 35 weeks gestation of pregnancy: Secondary | ICD-10-CM

## 2023-10-20 DIAGNOSIS — O09523 Supervision of elderly multigravida, third trimester: Secondary | ICD-10-CM

## 2023-10-20 DIAGNOSIS — Z8759 Personal history of other complications of pregnancy, childbirth and the puerperium: Secondary | ICD-10-CM

## 2023-10-20 DIAGNOSIS — O99213 Obesity complicating pregnancy, third trimester: Secondary | ICD-10-CM | POA: Diagnosis not present

## 2023-10-20 DIAGNOSIS — Z3A32 32 weeks gestation of pregnancy: Secondary | ICD-10-CM | POA: Diagnosis not present

## 2023-10-20 DIAGNOSIS — O403XX0 Polyhydramnios, third trimester, not applicable or unspecified: Secondary | ICD-10-CM | POA: Diagnosis not present

## 2023-10-20 DIAGNOSIS — O24113 Pre-existing diabetes mellitus, type 2, in pregnancy, third trimester: Secondary | ICD-10-CM | POA: Insufficient documentation

## 2023-10-20 DIAGNOSIS — O0990 Supervision of high risk pregnancy, unspecified, unspecified trimester: Secondary | ICD-10-CM

## 2023-10-20 DIAGNOSIS — O24119 Pre-existing diabetes mellitus, type 2, in pregnancy, unspecified trimester: Secondary | ICD-10-CM

## 2023-10-20 DIAGNOSIS — O99210 Obesity complicating pregnancy, unspecified trimester: Secondary | ICD-10-CM

## 2023-10-20 NOTE — Procedures (Signed)
 Tami Crawford 02/23/1986 [redacted]w[redacted]d  Fetus A Non-Stress Test Interpretation for 06/18/25NST ONLY  Indication: Diabetes, Polyhydramnios, and Advanced Maternal Age >40 years  Fetal Heart Rate A Mode: External Baseline Rate (A): 140 bpm Variability: Moderate Accelerations: 15 x 15 Decelerations: None Multiple birth?: No  Uterine Activity Mode: Toco Contraction Frequency (min): occas Contraction Duration (sec): 120 Contraction Quality: Mild Resting Tone Palpated: Relaxed  Interpretation (Fetal Testing) Nonstress Test Interpretation: Reactive Comments: Tracing reviewed byDr. Arnie Bibber

## 2023-10-20 NOTE — Progress Notes (Signed)
Patient did not keep appointment today. She will be called to reschedule.  

## 2023-10-20 NOTE — Procedures (Signed)
 Tami Crawford 1985-07-07 [redacted]w[redacted]d  Fetus A Non-Stress Test Interpretation for 10/20/23- N:ST only  Indication: Diabetes, AMA, Obese   Fetal Heart Rate A Mode: External Baseline Rate (A): 140 bpm Variability: Moderate Accelerations: 15 x 15 Decelerations: None Multiple birth?: No  Uterine Activity Mode: Toco Contraction Frequency (min): occas Contraction Duration (sec): 40-90 Contraction Quality: Mild Resting Tone Palpated: Relaxed  Interpretation (Fetal Testing) Nonstress Test Interpretation: Reactive Comments: Tracing reviewed byDr. Arnie Bibber

## 2023-10-20 NOTE — Addendum Note (Signed)
 Addended by: Taetum Flewellen A on: 10/20/2023 03:57 PM   Modules accepted: Orders

## 2023-10-26 ENCOUNTER — Ambulatory Visit (INDEPENDENT_AMBULATORY_CARE_PROVIDER_SITE_OTHER): Admitting: Obstetrics and Gynecology

## 2023-10-26 ENCOUNTER — Other Ambulatory Visit: Payer: Self-pay

## 2023-10-26 ENCOUNTER — Other Ambulatory Visit (HOSPITAL_COMMUNITY)
Admission: RE | Admit: 2023-10-26 | Discharge: 2023-10-26 | Disposition: A | Discharge status: Home / Self Care | Source: Ambulatory Visit | Attending: Obstetrics and Gynecology | Admitting: Obstetrics and Gynecology

## 2023-10-26 VITALS — BP 122/83 | HR 109 | Wt 261.0 lb

## 2023-10-26 DIAGNOSIS — O0990 Supervision of high risk pregnancy, unspecified, unspecified trimester: Secondary | ICD-10-CM | POA: Diagnosis present

## 2023-10-26 DIAGNOSIS — Z8759 Personal history of other complications of pregnancy, childbirth and the puerperium: Secondary | ICD-10-CM

## 2023-10-26 DIAGNOSIS — O99210 Obesity complicating pregnancy, unspecified trimester: Secondary | ICD-10-CM | POA: Diagnosis not present

## 2023-10-26 DIAGNOSIS — O09523 Supervision of elderly multigravida, third trimester: Secondary | ICD-10-CM

## 2023-10-26 DIAGNOSIS — Z3A36 36 weeks gestation of pregnancy: Secondary | ICD-10-CM

## 2023-10-26 DIAGNOSIS — O24119 Pre-existing diabetes mellitus, type 2, in pregnancy, unspecified trimester: Secondary | ICD-10-CM | POA: Diagnosis not present

## 2023-10-26 NOTE — Progress Notes (Signed)
   PRENATAL VISIT NOTE  Subjective:  Tami Crawford is a 38 y.o. H4E6986 at [redacted]w[redacted]d being seen today for ongoing prenatal care.  She is currently monitored for the following issues for this high-risk pregnancy and has History of gestational hypertension; Pre-existing type 2 diabetes mellitus during pregnancy, antepartum; Supervision of high risk pregnancy, antepartum; Advanced maternal age in multigravida; and Obesity affecting pregnancy, antepartum on their problem list.  Patient reports no complaints.  Contractions: Irritability. Vag. Bleeding: None.  Movement: Present. Denies leaking of fluid.   The following portions of the patient's history were reviewed and updated as appropriate: allergies, current medications, past family history, past medical history, past social history, past surgical history and problem list.   Objective:    Vitals:   10/26/23 1622  BP: 122/83  Pulse: (!) 109  Weight: 261 lb (118.4 kg)    Fetal Status:  Fetal Heart Rate (bpm): 159   Movement: Present    General: Alert, oriented and cooperative. Patient is in no acute distress.  Skin: Skin is warm and dry. No rash noted.   Cardiovascular: Normal heart rate noted  Respiratory: Normal respiratory effort, no problems with respiration noted  Abdomen: Soft, gravid, appropriate for gestational age.  Pain/Pressure: Absent     Pelvic: Cervical exam performed in the presence of a chaperone        Extremities: Normal range of motion.  Edema: Mild pitting, slight indentation  Mental Status: Normal mood and affect. Normal behavior. Normal judgment and thought content.   Assessment and Plan:  Pregnancy: H4E6986 at [redacted]w[redacted]d 1. Pre-existing type 2 diabetes mellitus during pregnancy, antepartum (Primary) Poorly controlled by growth. >99%ile and intermittent poly. She is not checking her CBGs.  Recommend IOL in 37th week. 6/30 recommended and pt agrees.   2. Supervision of high risk pregnancy, antepartum Cultures today.   - Cervicovaginal ancillary only - Strep Gp B NAA  3. Obesity affecting pregnancy, antepartum, unspecified obesity type  4. History of gestational hypertension BP today wnl  5. Multigravida of advanced maternal age in third trimester LR NIPS   Preterm labor symptoms and general obstetric precautions including but not limited to vaginal bleeding, contractions, leaking of fluid and fetal movement were reviewed in detail with the patient. Please refer to After Visit Summary for other counseling recommendations.   Return for postpartum visit.  Future Appointments  Date Time Provider Department Center  10/27/2023  3:00 PM [facility] PROVIDER 1 [facility]  10/27/2023  3:30 PM [facility] US2 [facility]    Vina Solian, MD

## 2023-10-26 NOTE — Patient Instructions (Signed)
Congratulations, you're on your way to having your baby!!!   You now have an induction scheduled.   If your induction is scheduled for the daytime, you will see an appointment time in MyChart. Please DO NOT show up at this time, this is just a placeholder on the schedule. You will get a call when your room is ready and will have 2 hours to arrive. The hospital staff can call anytime starting at 5 am through the rest of the day.   You will also get a call from the pre-admission nurse to go over pre-admission screen about 2 days prior to your induction date.      

## 2023-10-27 ENCOUNTER — Ambulatory Visit

## 2023-10-28 ENCOUNTER — Ambulatory Visit: Payer: Self-pay | Admitting: Obstetrics and Gynecology

## 2023-10-28 DIAGNOSIS — B951 Streptococcus, group B, as the cause of diseases classified elsewhere: Secondary | ICD-10-CM | POA: Insufficient documentation

## 2023-10-28 LAB — CERVICOVAGINAL ANCILLARY ONLY
Chlamydia: NEGATIVE
Comment: NEGATIVE
Comment: NORMAL
Neisseria Gonorrhea: NEGATIVE

## 2023-10-28 LAB — STREP GP B NAA: Strep Gp B NAA: POSITIVE — AB

## 2023-10-29 ENCOUNTER — Other Ambulatory Visit: Payer: Self-pay | Admitting: Obstetrics and Gynecology

## 2023-10-29 DIAGNOSIS — O24119 Pre-existing diabetes mellitus, type 2, in pregnancy, unspecified trimester: Secondary | ICD-10-CM

## 2023-10-29 DIAGNOSIS — O0990 Supervision of high risk pregnancy, unspecified, unspecified trimester: Secondary | ICD-10-CM

## 2023-10-29 NOTE — Progress Notes (Signed)
IOL orders placed 

## 2023-11-01 ENCOUNTER — Inpatient Hospital Stay (HOSPITAL_COMMUNITY)

## 2023-11-01 ENCOUNTER — Encounter (HOSPITAL_COMMUNITY): Payer: Self-pay | Admitting: Obstetrics and Gynecology

## 2023-11-01 ENCOUNTER — Inpatient Hospital Stay (HOSPITAL_COMMUNITY)
Admission: RE | Admit: 2023-11-01 | Discharge: 2023-11-03 | DRG: 807 | Disposition: A | Discharge status: Home / Self Care | Attending: Family Medicine | Admitting: Family Medicine

## 2023-11-01 ENCOUNTER — Encounter (HOSPITAL_COMMUNITY): Payer: Self-pay | Admitting: Anesthesiology

## 2023-11-01 DIAGNOSIS — O2412 Pre-existing diabetes mellitus, type 2, in childbirth: Secondary | ICD-10-CM | POA: Diagnosis present

## 2023-11-01 DIAGNOSIS — O0990 Supervision of high risk pregnancy, unspecified, unspecified trimester: Secondary | ICD-10-CM

## 2023-11-01 DIAGNOSIS — Z7982 Long term (current) use of aspirin: Secondary | ICD-10-CM | POA: Diagnosis not present

## 2023-11-01 DIAGNOSIS — O99820 Streptococcus B carrier state complicating pregnancy: Secondary | ICD-10-CM | POA: Diagnosis not present

## 2023-11-01 DIAGNOSIS — K219 Gastro-esophageal reflux disease without esophagitis: Secondary | ICD-10-CM | POA: Diagnosis present

## 2023-11-01 DIAGNOSIS — O134 Gestational [pregnancy-induced] hypertension without significant proteinuria, complicating childbirth: Secondary | ICD-10-CM | POA: Diagnosis present

## 2023-11-01 DIAGNOSIS — O9902 Anemia complicating childbirth: Secondary | ICD-10-CM | POA: Diagnosis present

## 2023-11-01 DIAGNOSIS — Z3A37 37 weeks gestation of pregnancy: Secondary | ICD-10-CM | POA: Diagnosis not present

## 2023-11-01 DIAGNOSIS — Z3490 Encounter for supervision of normal pregnancy, unspecified, unspecified trimester: Principal | ICD-10-CM

## 2023-11-01 DIAGNOSIS — O99214 Obesity complicating childbirth: Secondary | ICD-10-CM | POA: Diagnosis present

## 2023-11-01 DIAGNOSIS — O99824 Streptococcus B carrier state complicating childbirth: Secondary | ICD-10-CM | POA: Diagnosis present

## 2023-11-01 DIAGNOSIS — Z794 Long term (current) use of insulin: Secondary | ICD-10-CM | POA: Diagnosis not present

## 2023-11-01 DIAGNOSIS — Z7984 Long term (current) use of oral hypoglycemic drugs: Secondary | ICD-10-CM | POA: Diagnosis not present

## 2023-11-01 DIAGNOSIS — E119 Type 2 diabetes mellitus without complications: Secondary | ICD-10-CM | POA: Diagnosis present

## 2023-11-01 DIAGNOSIS — O9962 Diseases of the digestive system complicating childbirth: Secondary | ICD-10-CM | POA: Diagnosis present

## 2023-11-01 DIAGNOSIS — O403XX0 Polyhydramnios, third trimester, not applicable or unspecified: Secondary | ICD-10-CM | POA: Diagnosis present

## 2023-11-01 DIAGNOSIS — E66813 Obesity, class 3: Secondary | ICD-10-CM | POA: Diagnosis present

## 2023-11-01 DIAGNOSIS — O24119 Pre-existing diabetes mellitus, type 2, in pregnancy, unspecified trimester: Secondary | ICD-10-CM

## 2023-11-01 DIAGNOSIS — O24424 Gestational diabetes mellitus in childbirth, insulin controlled: Secondary | ICD-10-CM | POA: Diagnosis not present

## 2023-11-01 LAB — BASIC METABOLIC PANEL WITH GFR
Anion gap: 10 (ref 5–15)
BUN: <5 mg/dL — ABNORMAL LOW (ref 6–20)
CO2: 17 mmol/L — ABNORMAL LOW (ref 22–32)
Calcium: 8.3 mg/dL — ABNORMAL LOW (ref 8.9–10.3)
Chloride: 108 mmol/L (ref 98–111)
Creatinine, Ser: 0.51 mg/dL (ref 0.44–1.00)
GFR, Estimated: >60 mL/min (ref >60)
Glucose, Bld: 232 mg/dL — ABNORMAL HIGH (ref 70–99)
Potassium: 3.5 mmol/L (ref 3.5–5.1)
Sodium: 135 mmol/L (ref 135–145)

## 2023-11-01 LAB — TYPE AND SCREEN
ABO/RH(D): O POS
Antibody Screen: NEGATIVE

## 2023-11-01 LAB — CBC
HCT: 36.5 % (ref 36.0–46.0)
Hemoglobin: 12 g/dL (ref 12.0–15.0)
MCH: 27.6 pg (ref 26.0–34.0)
MCHC: 32.9 g/dL (ref 30.0–36.0)
MCV: 84.1 fL (ref 80.0–100.0)
Platelets: 188 10*3/uL (ref 150–400)
RBC: 4.34 MIL/uL (ref 3.87–5.11)
RDW: 14.3 % (ref 11.5–15.5)
WBC: 6.7 10*3/uL (ref 4.0–10.5)
nRBC: 0 % (ref 0.0–0.2)

## 2023-11-01 LAB — HEMOGLOBIN A1C
Hgb A1c MFr Bld: 7.6 % — ABNORMAL HIGH (ref 4.8–5.6)
Mean Plasma Glucose: 171.42 mg/dL

## 2023-11-01 LAB — GLUCOSE, CAPILLARY
Glucose-Capillary: 224 mg/dL — ABNORMAL HIGH (ref 70–99)
Glucose-Capillary: 173 mg/dL — ABNORMAL HIGH (ref 70–99)
Glucose-Capillary: 135 mg/dL — ABNORMAL HIGH (ref 70–99)
Glucose-Capillary: 116 mg/dL — ABNORMAL HIGH (ref 70–99)
Glucose-Capillary: 97 mg/dL (ref 70–99)
Glucose-Capillary: 123 mg/dL — ABNORMAL HIGH (ref 70–99)
Glucose-Capillary: 120 mg/dL — ABNORMAL HIGH (ref 70–99)
Glucose-Capillary: 97 mg/dL (ref 70–99)
Glucose-Capillary: 91 mg/dL (ref 70–99)
Glucose-Capillary: 96 mg/dL (ref 70–99)
Glucose-Capillary: 98 mg/dL (ref 70–99)
Glucose-Capillary: 103 mg/dL — ABNORMAL HIGH (ref 70–99)
Glucose-Capillary: 103 mg/dL — ABNORMAL HIGH (ref 70–99)
Glucose-Capillary: 100 mg/dL — ABNORMAL HIGH (ref 70–99)

## 2023-11-01 LAB — RPR: RPR Ser Ql: NONREACTIVE

## 2023-11-01 MED ORDER — EPHEDRINE 5 MG/ML INJ
10.0000 mg | INTRAVENOUS | Status: DC | PRN
  Filled 2023-11-01: qty 5

## 2023-11-01 MED ORDER — TERBUTALINE SULFATE 1 MG/ML IJ SOLN: 0.2500 mg | Freq: Once | INTRAMUSCULAR | Status: DC | PRN

## 2023-11-01 MED ORDER — OXYTOCIN-SODIUM CHLORIDE 30-0.9 UT/500ML-% IV SOLN
2.5000 [IU]/h | INTRAVENOUS | Status: DC
  Administered 2023-11-02: 2.5 [IU]/h via INTRAVENOUS
  Filled 2023-11-01 (×2): qty 500

## 2023-11-01 MED ORDER — PENICILLIN G POT IN DEXTROSE 60000 UNIT/ML IV SOLN
3.0000 10*6.[IU] | INTRAVENOUS | Status: DC
  Administered 2023-11-01 – 2023-11-02 (×6): 3 10*6.[IU] via INTRAVENOUS
  Filled 2023-11-01 (×6): qty 50

## 2023-11-01 MED ORDER — POTASSIUM CHLORIDE CRYS ER 20 MEQ PO TBCR
20.0000 meq | EXTENDED_RELEASE_TABLET | Freq: Two times a day (BID) | ORAL | Status: DC
  Administered 2023-11-01: 20 meq via ORAL
  Filled 2023-11-01: qty 1

## 2023-11-01 MED ORDER — DEXTROSE IN LACTATED RINGERS 5 % IV SOLN: INTRAVENOUS | Status: AC

## 2023-11-01 MED ORDER — MISOPROSTOL 50MCG HALF TABLET
50.0000 ug | ORAL_TABLET | Freq: Once | ORAL | Status: AC
  Administered 2023-11-01: 50 ug via ORAL
  Filled 2023-11-01: qty 1

## 2023-11-01 MED ORDER — INSULIN GLARGINE-YFGN 100 UNIT/ML ~~LOC~~ SOLN
7.0000 [IU] | Freq: Every day | SUBCUTANEOUS | Status: DC
  Filled 2023-11-01 (×2): qty 0.07

## 2023-11-01 MED ORDER — DIPHENHYDRAMINE HCL 50 MG/ML IJ SOLN
12.5000 mg | INTRAMUSCULAR | Status: DC | PRN
  Administered 2023-11-02: 12.5 mg via INTRAVENOUS
  Filled 2023-11-01: qty 1

## 2023-11-01 MED ORDER — LACTATED RINGERS IV SOLN: INTRAVENOUS | Status: DC

## 2023-11-01 MED ORDER — OXYCODONE-ACETAMINOPHEN 5-325 MG PO TABS: 1.0000 | ORAL_TABLET | ORAL | Status: DC | PRN

## 2023-11-01 MED ORDER — EPHEDRINE 5 MG/ML INJ: 10.0000 mg | INTRAVENOUS | Status: DC | PRN

## 2023-11-01 MED ORDER — PHENYLEPHRINE 80 MCG/ML (10ML) SYRINGE FOR IV PUSH (FOR BLOOD PRESSURE SUPPORT)
80.0000 ug | PREFILLED_SYRINGE | INTRAVENOUS | Status: DC | PRN
Start: 2023-11-01 — End: 2023-11-02
  Filled 2023-11-01: qty 10

## 2023-11-01 MED ORDER — OXYTOCIN BOLUS FROM INFUSION
333.0000 mL | Freq: Once | INTRAVENOUS | Status: AC
  Administered 2023-11-02: 333 mL via INTRAVENOUS

## 2023-11-01 MED ORDER — OXYTOCIN-SODIUM CHLORIDE 30-0.9 UT/500ML-% IV SOLN
1.0000 m[IU]/min | INTRAVENOUS | Status: DC
  Administered 2023-11-01: 2 m[IU]/min via INTRAVENOUS

## 2023-11-01 MED ORDER — INSULIN REGULAR(HUMAN) IN NACL 100-0.9 UT/100ML-% IV SOLN
INTRAVENOUS | Status: DC
  Administered 2023-11-01: 1.7 [IU]/h via INTRAVENOUS
  Filled 2023-11-01: qty 100

## 2023-11-01 MED ORDER — ONDANSETRON HCL 4 MG/2ML IJ SOLN: 4.0000 mg | Freq: Four times a day (QID) | INTRAMUSCULAR | Status: DC | PRN

## 2023-11-01 MED ORDER — LIDOCAINE HCL (PF) 1 % IJ SOLN
30.0000 mL | INTRAMUSCULAR | Status: DC | PRN
Start: 2023-11-01 — End: 2023-11-02

## 2023-11-01 MED ORDER — OXYCODONE-ACETAMINOPHEN 5-325 MG PO TABS: 2.0000 | ORAL_TABLET | ORAL | Status: DC | PRN

## 2023-11-01 MED ORDER — PHENYLEPHRINE 80 MCG/ML (10ML) SYRINGE FOR IV PUSH (FOR BLOOD PRESSURE SUPPORT): 80.0000 ug | PREFILLED_SYRINGE | INTRAVENOUS | Status: DC | PRN

## 2023-11-01 MED ORDER — DEXTROSE 50 % IV SOLN: 0.0000 mL | INTRAVENOUS | Status: DC | PRN

## 2023-11-01 MED ORDER — LACTATED RINGERS IV SOLN
500.0000 mL | Freq: Once | INTRAVENOUS | Status: AC
  Administered 2023-11-02: 500 mL via INTRAVENOUS

## 2023-11-01 MED ORDER — FENTANYL CITRATE (PF) 100 MCG/2ML IJ SOLN
50.0000 ug | INTRAMUSCULAR | Status: DC | PRN
  Administered 2023-11-01: 50 ug via INTRAVENOUS
  Administered 2023-11-01: 100 ug via INTRAVENOUS
  Filled 2023-11-01 (×2): qty 2

## 2023-11-01 MED ORDER — FENTANYL-BUPIVACAINE-NACL 0.5-0.125-0.9 MG/250ML-% EP SOLN
12.0000 mL/h | EPIDURAL | Status: DC | PRN
  Administered 2023-11-02: 12 mL/h via EPIDURAL
  Filled 2023-11-01: qty 250

## 2023-11-01 MED ORDER — SOD CITRATE-CITRIC ACID 500-334 MG/5ML PO SOLN: 30.0000 mL | ORAL | Status: DC | PRN

## 2023-11-01 MED ORDER — SODIUM CHLORIDE 0.9 % IV SOLN
5.0000 10*6.[IU] | Freq: Once | INTRAVENOUS | Status: AC
  Administered 2023-11-01: 5 10*6.[IU] via INTRAVENOUS
  Filled 2023-11-01: qty 5

## 2023-11-01 MED ORDER — LACTATED RINGERS IV SOLN
500.0000 mL | INTRAVENOUS | Status: DC | PRN
  Administered 2023-11-01: 1000 mL via INTRAVENOUS

## 2023-11-01 MED ORDER — MISOPROSTOL 25 MCG QUARTER TABLET
25.0000 ug | ORAL_TABLET | Freq: Once | ORAL | Status: AC
  Administered 2023-11-01: 25 ug via VAGINAL

## 2023-11-01 MED ORDER — MISOPROSTOL 25 MCG QUARTER TABLET
25.0000 ug | ORAL_TABLET | Freq: Once | ORAL | Status: AC
  Administered 2023-11-01: 25 ug via VAGINAL
  Filled 2023-11-01: qty 1

## 2023-11-01 MED ORDER — MISOPROSTOL 25 MCG QUARTER TABLET
25.0000 ug | ORAL_TABLET | Freq: Once | ORAL | Status: AC
  Administered 2023-11-01: 25 ug via ORAL

## 2023-11-01 MED ORDER — ACETAMINOPHEN 325 MG PO TABS: 650.0000 mg | ORAL_TABLET | ORAL | Status: DC | PRN

## 2023-11-01 NOTE — H&P (Signed)
 OBSTETRIC ADMISSION HISTORY AND PHYSICAL  Keta Suzette Slice is a 38 y.o. female [phone] with IUP at [redacted]w[redacted]d by US  at 14 weeks presenting for IOL 2/2 DM2, requiring insulin  in pregnancy. She reports +FMs, No LOF, no VB, no blurry vision, headaches or peripheral edema, and RUQ pain.  She plans on breast and bottle feeding. She request Nexplanon outpatient for birth control. She received her prenatal care at [facility]   Dating: By 14 week US  --->  Estimated Date of Delivery: 11/22/23  Sono:    @[redacted]w[redacted]d , CWD, normal anatomy, variable presentation, anterior placental lie, 313g, 82% EFW   Prenatal History/Complications: DM2, Poly   Past Medical History: Past Medical History:  Diagnosis Date   Ectopic pregnancy 04/18/2022   Methotrexate  administered 04/18/2022  HCG <1 on 06/03/2022     Gestational diabetes    Gestational hypertension    Headache    History of ectopic pregnancy 06/10/2023   History of gestational hypertension 03/15/2013   Language barrier 07/15/2021   Pre-existing type 2 diabetes mellitus during pregnancy, antepartum 06/10/2021   12/22: Hgb A1C 6.9 early pregnancy  7/23: 2 hour GTT PP           Glucose, GTT - Fasting    70 - 99 mg/dL    91       Glucose, 2 hour    70 - 139 mg/dL    860            Past Surgical History: Past Surgical History:  Procedure Laterality Date   PERINEAL LACERATION REPAIR N/A 04/28/2013   Procedure: SUTURE REPAIR PERINEAL LACERATION;  Surgeon: Hargis Paradise, MD;  Location: [facility];  Service: Gynecology;  Laterality: N/A;    Obstetrical History: OB History     Gravida  5   Para  3   Term  3   Preterm  0   AB  1   Living  3      SAB  0   IAB  0   Ectopic  1   Multiple  0   Live Births  3           Social History Social History   Socioeconomic History   Marital status: Married    Spouse name: Not on file   Number of children: Not on file   Years of education: Not on file   Highest education level: Not on file   Occupational History   Not on file  Tobacco Use   Smoking status: Never    Passive exposure: Never   Smokeless tobacco: Never  Vaping Use   Vaping status: Never Used  Substance and Sexual Activity   Alcohol use: No   Drug use: No   Sexual activity: Yes    Birth control/protection: Other-see comments    Comment: unknown  Other Topics Concern   Not on file  Social History Narrative   Not on file   Social Drivers of Health   Financial Resource Strain: Not on file  Food Insecurity: No Food Insecurity (11/01/2023)   Hunger Vital Sign    Worried About Running Out of Food in the Last Year: Never true    Ran Out of Food in the Last Year: Never true  Transportation Needs: No Transportation Needs (11/01/2023)   PRAPARE - Administrator, Civil Service (Medical): No    Lack of Transportation (Non-Medical): No  Physical Activity: Not on file  Stress: Not on file  Social Connections: Moderately Integrated (11/01/2023)  Social Advertising account executive    Frequency of Communication with Friends and Family: More than three times a week    Frequency of Social Gatherings with Friends and Family: Twice a week    Attends Religious Services: More than 4 times per year    Active Member of [employer] or Organizations: No    Attends Engineer, structural: Patient declined    Marital Status: Married    Family History: Family History  Problem Relation Age of Onset   Healthy Mother    Other Father        sick a few days before he died, unknown cause   Asthma Neg Hx    Cancer Neg Hx    Diabetes Neg Hx    Hypertension Neg Hx    Heart disease Neg Hx    Stroke Neg Hx     Allergies: No Known Allergies  Medications Prior to Admission  Medication Sig Dispense Refill Last Dose/Taking   Accu-Chek Softclix Lancets lancets Use as instructed 100 each 12 10/31/2023   acetaminophen  (TYLENOL ) 325 MG tablet Take 650 mg by mouth every 6 (six) hours as needed.   10/31/2023    aspirin  EC 81 MG tablet Take 1 tablet (81 mg total) by mouth daily. Swallow whole. 30 tablet 12 11/01/2023 Morning   Blood Glucose Monitoring Suppl (ACCU-CHEK GUIDE) w/Device KIT 1 Device by Does not apply route in the morning, at noon, in the evening, and at bedtime. 1 kit 0 10/31/2023   glucose blood test strip Check blood glucose four times a day 100 each 12 10/31/2023   insulin  glargine (LANTUS ) 100 UNIT/ML Solostar Pen Inject 13 Units into the skin daily with breakfast.   10/31/2023   Insulin  Pen Needle 31G X 8 MM MISC 1 Device by Does not apply route daily. 100 each 5 10/31/2023   metFORMIN  (GLUCOPHAGE ) 1000 MG tablet Take 1 tablet (1,000 mg total) by mouth 2 (two) times daily with a meal. 60 tablet 2 11/01/2023 Morning   Prenatal Vit-Fe Fumarate-FA (PRENATAL PLUS VITAMIN/MINERAL) 27-1 MG TABS Take 1 tablet by mouth daily. 30 tablet 11 10/31/2023   famotidine  (PEPCID ) 20 MG tablet Take 1 tablet (20 mg total) by mouth 2 (two) times daily. 60 tablet 2      Review of Systems   All systems reviewed and negative except as stated in HPI  Blood pressure 131/82, pulse (!) 127, temperature 98.4 F (36.9 C), temperature source Oral, resp. rate 18, height 5' 5 (1.651 m), weight 118.2 kg, last menstrual period 03/06/2023, SpO2 100%. General appearance: alert, cooperative, and appears stated age Lungs: clear to auscultation bilaterally Heart: regular rate and rhythm Abdomen: soft, non-tender; bowel sounds normal Pelvic: adequate Extremities: Homans sign is negative, no sign of DVT DTR's +2 Presentation: cephalic Fetal monitoringBaseline: 160 bpm, Variability: Good {> 6 bpm), Accelerations: Reactive, and Decelerations: Absent Uterine activityFrequency: Every 3-6 minutes, Duration: 60-90 seconds, and Intensity: mild     Prenatal labs: ABO, Rh: O/Positive/-- (01/30 1119) Antibody: Negative (01/30 1119) Rubella: 23.80 (01/30 1119) RPR: Non Reactive (01/30 1119)  HBsAg: Negative (01/30 1119)  HIV:  Non Reactive (01/30 1119)  GBS: Positive/-- (06/24 1644)    Lab Results  Component Value Date   GBS Positive (A) 10/26/2023   GTT T2DM Genetic screening  LR Female Anatomy US  WNL  Immunization History  Administered Date(s) Administered   Influenza,inj,Quad PF,6+ Mos 03/17/2013, 01/27/2015   Tdap 03/17/2013, 07/15/2021    Prenatal Transfer Tool  Maternal Diabetes: Yes:  Diabetes Type:  Pre-pregnancy Genetic Screening: Normal Maternal Ultrasounds/Referrals: Normal Fetal Ultrasounds or other Referrals:  None Maternal Substance Abuse:  No Significant Maternal Medications:  Meds include: Other:  Metformin  and Insulin  Significant Maternal Lab Results: Group B Strep positive Number of Prenatal Visits:greater than 3 verified prenatal visits Maternal Vaccinations:none Other Comments:  None   No results found for this or any previous visit (from the past 24 hours).  Patient Active Problem List   Diagnosis Date Noted   Pregnancy 11/01/2023   Positive GBS test 10/28/2023   Obesity affecting pregnancy, antepartum 06/24/2023   Supervision of high risk pregnancy, antepartum 06/03/2023   Advanced maternal age in multigravida 06/03/2023   Pre-existing type 2 diabetes mellitus during pregnancy, antepartum 06/10/2021   History of gestational hypertension 03/15/2013    Assessment/Plan:  Leathia Farnell is a 38 y.o. H4E6986 at [redacted]w[redacted]d here for IOL 2/2 DM2, requiring insulin  in pregnancy.   #Labor:IOL in latent phase with cervical ripening indicated.  #Pain: Coping well, open to pain medications or epidural - on request.  #FWB: Cat 1 #GBS status:  positive #Feeding: Breastmilk  and Formula #Reproductive Life planning: Nexplanon #Circ:  not applicable #T2DM: CBG monitor q4, Endotool PRN. Will order lantus  7 units HS, d/t home dose 15 units. Initial CBG elevate 224, will recheck in 1 hour. Patient reports carb-heavy breakfast approximately 1 hour prior. Home readings have shown good  control.   Camie DELENA Rote, CNM  11/01/2023, 9:46 AM

## 2023-11-01 NOTE — Progress Notes (Signed)
 LABOR PROGRESS NOTE  Patient Name: Tami Crawford, female   DOB: 1985-09-01, 37 y.o.  MRN: [id number]  Coping well. Ambulating in room.   Discussed role, risks, benefits of FB vs Pitocin . Given contracting too much for more cytotec .   Blood pressure (!) 135/92, pulse 77, temperature 98.3 F (36.8 C), temperature source Oral, resp. rate 19, height 5' 5 (1.651 m), weight 118.2 kg, last menstrual period 03/06/2023, SpO2 99%.  Dilation: 1 Effacement (%): Thick Cervical Position: Posterior Station: Ballotable Presentation: Vertex Exam by:: Dr. Loyola Very posterior and soft. Difficult exam.   EFM: baseline 145, accels, no decels, moderate variability TOCO: q1-61min contractions; rating mild  Unable to place FB due to difficult exam.  Will start pitocin  if Ctx consistently space greater then 4 mins.  Encouraged ambulation around the unit to facilitate improved fetal station.    Tami Loyola, MD

## 2023-11-01 NOTE — Anesthesia Preprocedure Evaluation (Signed)
 Anesthesia Evaluation  Patient identified by MRN, date of birth, ID band Patient awake    Reviewed: Allergy & Precautions, Patient's Chart, lab work & pertinent test results  Airway Mallampati: II       Dental no notable dental hx. (+) Dental Advisory Given   Pulmonary neg pulmonary ROS   Pulmonary exam normal        Cardiovascular hypertension, Normal cardiovascular exam Rhythm:Regular     Neuro/Psych  Headaches  negative psych ROS   GI/Hepatic Neg liver ROS,GERD  Medicated,,  Endo/Other  diabetes, Well Controlled, Gestational, Oral Hypoglycemic Agents, Insulin  Dependent  Class 3 obesity  Renal/GU negative Renal ROS  negative genitourinary   Musculoskeletal negative musculoskeletal ROS (+)    Abdominal  (+) + obese  Peds  Hematology  (+) Blood dyscrasia, anemia   Anesthesia Other Findings   Reproductive/Obstetrics (+) Pregnancy                              Anesthesia Physical Anesthesia Plan  ASA: 3  Anesthesia Plan: Epidural   Post-op Pain Management: Minimal or no pain anticipated   Induction:   PONV Risk Score and Plan: Treatment may vary due to age or medical condition  Airway Management Planned: Natural Airway  Additional Equipment: None and Fetal Monitoring  Intra-op Plan:   Post-operative Plan:   Informed Consent: I have reviewed the patients History and Physical, chart, labs and discussed the procedure including the risks, benefits and alternatives for the proposed anesthesia with the patient or authorized representative who has indicated his/her understanding and acceptance.       Plan Discussed with: Anesthesiologist  Anesthesia Plan Comments:          Anesthesia Quick Evaluation

## 2023-11-01 NOTE — Inpatient Diabetes Management (Signed)
 Inpatient Diabetes Program Recommendations  AACE/ADA: New Consensus Statement on Inpatient Glycemic Control (2015)  Target Ranges:  Prepandial:   less than 140 mg/dL      Peak postprandial:   less than 180 mg/dL (1-2 hours)      Critically ill patients:  140 - 180 mg/dL   Lab Results  Component Value Date   GLUCAP 135 (H) 11/01/2023   HGBA1C 6.7 (H) 06/03/2023    Review of Glycemic Control  Latest Reference Range & Units 11/01/23 09:51 11/01/23 10:54 11/01/23 12:04  Glucose-Capillary 70 - 99 mg/dL 775 (H) 826 (H) 864 (H)  (H): Data is abnormally high  Diabetes history: DM2 Outpatient Diabetes medications: Lantus  16 units at bedtime, Metformin  1000 mg BID Current orders for Inpatient glycemic control: IV insulin   Inpatient Diabetes Program Recommendations:    Please consider discontinuing Semglee  7 units at bedtime.  Met with patient at bedside.  She was not taking any DM medications prior to pregnancy.  I do not expect her to need any insulin  once delivered.  Explained to patient and she verbalizes understanding.  Will follow.  Thank you, Wyvonna Pinal, MSN, CDCES Diabetes Coordinator Inpatient Diabetes Program [phone] (team pager from 8a-5p)

## 2023-11-01 NOTE — Progress Notes (Signed)
 Labor Progress Note Tami Crawford is a 38 y.o. H4E6986 at [redacted]w[redacted]d presented for IOL 2/2 T2DM  S:  Coping well  O:  BP 118/72   Pulse 91   Temp 98.2 F (36.8 C) (Oral)   Resp 18   Ht 5' 5 (1.651 m)   Wt 118.2 kg   LMP 03/06/2023 (Approximate)   SpO2 100%   BMI 43.37 kg/m   EFM: baseline 150 bpm/ moderate variability/ 15x15 accels/ absent decels  Toco/IUPC: 1-6  SVE: Dilation: Fingertip Effacement (%): Thick Station: Ballotable Presentation: Vertex Exam by:: Camie Rote, CNM Pitocin : 0 mu/min  A/P: 38 y.o. H4E6986 [redacted]w[redacted]d IOL 2/2 T2DM 1. Labor: IOL in latent phase with continued cervical ripening indicated. Plan repeat dual route cytotec  25/25 this cycle and FB placement when able.  2. FWB: Cat 1 3. Pain: Coping well, epidural on request 4. GBS pos with tx   Anticipate NVSB.  Camie DELENA Rote, CNM 4:48 PM

## 2023-11-02 ENCOUNTER — Inpatient Hospital Stay (HOSPITAL_COMMUNITY): Payer: Self-pay | Admitting: Anesthesiology

## 2023-11-02 ENCOUNTER — Encounter (HOSPITAL_COMMUNITY): Payer: Self-pay | Admitting: Obstetrics and Gynecology

## 2023-11-02 ENCOUNTER — Encounter (HOSPITAL_COMMUNITY): Payer: Self-pay | Admitting: Anesthesiology

## 2023-11-02 DIAGNOSIS — O24424 Gestational diabetes mellitus in childbirth, insulin controlled: Secondary | ICD-10-CM | POA: Diagnosis not present

## 2023-11-02 DIAGNOSIS — O403XX0 Polyhydramnios, third trimester, not applicable or unspecified: Secondary | ICD-10-CM | POA: Diagnosis not present

## 2023-11-02 DIAGNOSIS — Z3A37 37 weeks gestation of pregnancy: Secondary | ICD-10-CM | POA: Diagnosis not present

## 2023-11-02 DIAGNOSIS — O99820 Streptococcus B carrier state complicating pregnancy: Secondary | ICD-10-CM | POA: Diagnosis not present

## 2023-11-02 LAB — CBC
HCT: 36.2 % (ref 36.0–46.0)
Hemoglobin: 11.8 g/dL — ABNORMAL LOW (ref 12.0–15.0)
MCH: 27.3 pg (ref 26.0–34.0)
MCHC: 32.6 g/dL (ref 30.0–36.0)
MCV: 83.8 fL (ref 80.0–100.0)
Platelets: 184 10*3/uL (ref 150–400)
RBC: 4.32 MIL/uL (ref 3.87–5.11)
RDW: 14.1 % (ref 11.5–15.5)
WBC: 7.3 10*3/uL (ref 4.0–10.5)
nRBC: 0 % (ref 0.0–0.2)

## 2023-11-02 LAB — CBC WITH DIFFERENTIAL/PLATELET
Abs Immature Granulocytes: 0.04 10*3/uL (ref 0.00–0.07)
Basophils Absolute: 0 10*3/uL (ref 0.0–0.1)
Basophils Relative: 0 %
Eosinophils Absolute: 0.1 10*3/uL (ref 0.0–0.5)
Eosinophils Relative: 1 %
HCT: 40.7 % (ref 36.0–46.0)
Hemoglobin: 13.6 g/dL (ref 12.0–15.0)
Immature Granulocytes: 0 %
Lymphocytes Relative: 20 %
Lymphs Abs: 1.8 10*3/uL (ref 0.7–4.0)
MCH: 28 pg (ref 26.0–34.0)
MCHC: 33.4 g/dL (ref 30.0–36.0)
MCV: 83.9 fL (ref 80.0–100.0)
Monocytes Absolute: 0.8 10*3/uL (ref 0.1–1.0)
Monocytes Relative: 9 %
Neutro Abs: 6.2 10*3/uL (ref 1.7–7.7)
Neutrophils Relative %: 70 %
Platelets: 204 10*3/uL (ref 150–400)
RBC: 4.85 MIL/uL (ref 3.87–5.11)
RDW: 14.3 % (ref 11.5–15.5)
WBC: 8.9 10*3/uL (ref 4.0–10.5)
nRBC: 0 % (ref 0.0–0.2)

## 2023-11-02 LAB — COMPREHENSIVE METABOLIC PANEL WITH GFR
ALT: 15 U/L (ref 0–44)
AST: 22 U/L (ref 15–41)
Albumin: 2.5 g/dL — ABNORMAL LOW (ref 3.5–5.0)
Alkaline Phosphatase: 102 U/L (ref 38–126)
Anion gap: 8 (ref 5–15)
BUN: <5 mg/dL — ABNORMAL LOW (ref 6–20)
CO2: 18 mmol/L — ABNORMAL LOW (ref 22–32)
Calcium: 9 mg/dL (ref 8.9–10.3)
Chloride: 113 mmol/L — ABNORMAL HIGH (ref 98–111)
Creatinine, Ser: 0.46 mg/dL (ref 0.44–1.00)
GFR, Estimated: >60 mL/min (ref >60)
Glucose, Bld: 111 mg/dL — ABNORMAL HIGH (ref 70–99)
Potassium: 3.8 mmol/L (ref 3.5–5.1)
Sodium: 139 mmol/L (ref 135–145)
Total Bilirubin: 0.7 mg/dL (ref 0.0–1.2)
Total Protein: 5.5 g/dL — ABNORMAL LOW (ref 6.5–8.1)

## 2023-11-02 LAB — GLUCOSE, CAPILLARY
Glucose-Capillary: 109 mg/dL — ABNORMAL HIGH (ref 70–99)
Glucose-Capillary: 116 mg/dL — ABNORMAL HIGH (ref 70–99)
Glucose-Capillary: 113 mg/dL — ABNORMAL HIGH (ref 70–99)
Glucose-Capillary: 120 mg/dL — ABNORMAL HIGH (ref 70–99)
Glucose-Capillary: 106 mg/dL — ABNORMAL HIGH (ref 70–99)
Glucose-Capillary: 106 mg/dL — ABNORMAL HIGH (ref 70–99)
Glucose-Capillary: 107 mg/dL — ABNORMAL HIGH (ref 70–99)
Glucose-Capillary: 118 mg/dL — ABNORMAL HIGH (ref 70–99)
Glucose-Capillary: 109 mg/dL — ABNORMAL HIGH (ref 70–99)
Glucose-Capillary: 114 mg/dL — ABNORMAL HIGH (ref 70–99)

## 2023-11-02 LAB — BASIC METABOLIC PANEL WITH GFR
Anion gap: 5 (ref 5–15)
BUN: <5 mg/dL — ABNORMAL LOW (ref 6–20)
CO2: 22 mmol/L (ref 22–32)
Calcium: 8.5 mg/dL — ABNORMAL LOW (ref 8.9–10.3)
Chloride: 109 mmol/L (ref 98–111)
Creatinine, Ser: 0.42 mg/dL — ABNORMAL LOW (ref 0.44–1.00)
GFR, Estimated: >60 mL/min (ref >60)
Glucose, Bld: 105 mg/dL — ABNORMAL HIGH (ref 70–99)
Potassium: 3.2 mmol/L — ABNORMAL LOW (ref 3.5–5.1)
Sodium: 136 mmol/L (ref 135–145)

## 2023-11-02 LAB — PROTEIN / CREATININE RATIO, URINE
Creatinine, Urine: 50 mg/dL
Protein Creatinine Ratio: 0.18 mg/mg{creat} — ABNORMAL HIGH (ref 0.00–0.15)
Total Protein, Urine: 9 mg/dL

## 2023-11-02 MED ORDER — TETANUS-DIPHTH-ACELL PERTUSSIS 5-2.5-18.5 LF-MCG/0.5 IM SUSY
0.5000 mL | PREFILLED_SYRINGE | Freq: Once | INTRAMUSCULAR | Status: DC
  Filled 2023-11-02: qty 0.5

## 2023-11-02 MED ORDER — ACETAMINOPHEN 325 MG PO TABS: 650.0000 mg | ORAL_TABLET | ORAL | Status: DC | PRN

## 2023-11-02 MED ORDER — DEXTROSE IN LACTATED RINGERS 5 % IV SOLN: INTRAVENOUS | Status: DC

## 2023-11-02 MED ORDER — DIPHENHYDRAMINE HCL 25 MG PO CAPS: 25.0000 mg | ORAL_CAPSULE | Freq: Four times a day (QID) | ORAL | Status: DC | PRN

## 2023-11-02 MED ORDER — SODIUM CHLORIDE 0.9% FLUSH: 3.0000 mL | INTRAVENOUS | Status: DC | PRN

## 2023-11-02 MED ORDER — FUROSEMIDE 20 MG PO TABS
40.0000 mg | ORAL_TABLET | Freq: Every day | ORAL | Status: DC
  Administered 2023-11-02 – 2023-11-03 (×2): 40 mg via ORAL
  Filled 2023-11-02 (×2): qty 2

## 2023-11-02 MED ORDER — ONDANSETRON HCL 4 MG PO TABS: 4.0000 mg | ORAL_TABLET | ORAL | Status: DC | PRN

## 2023-11-02 MED ORDER — DIBUCAINE (PERIANAL) 1 % EX OINT: 1.0000 | TOPICAL_OINTMENT | CUTANEOUS | Status: DC | PRN

## 2023-11-02 MED ORDER — IBUPROFEN 800 MG PO TABS
800.0000 mg | ORAL_TABLET | Freq: Three times a day (TID) | ORAL | Status: DC
  Administered 2023-11-02 – 2023-11-03 (×3): 800 mg via ORAL
  Filled 2023-11-02 (×3): qty 1

## 2023-11-02 MED ORDER — POTASSIUM CHLORIDE CRYS ER 20 MEQ PO TBCR
20.0000 meq | EXTENDED_RELEASE_TABLET | Freq: Once | ORAL | Status: AC
Start: 2023-11-02 — End: 2023-11-02
  Administered 2023-11-02: 20 meq via ORAL
  Filled 2023-11-02: qty 1

## 2023-11-02 MED ORDER — COCONUT OIL OIL
1.0000 | TOPICAL_OIL | Status: DC | PRN
Start: 2023-11-02 — End: 2023-11-03

## 2023-11-02 MED ORDER — PRENATAL MULTIVITAMIN CH
1.0000 | ORAL_TABLET | Freq: Every day | ORAL | Status: DC
  Administered 2023-11-03: 1 via ORAL
  Filled 2023-11-02: qty 1

## 2023-11-02 MED ORDER — NIFEDIPINE ER OSMOTIC RELEASE 30 MG PO TB24
30.0000 mg | ORAL_TABLET | Freq: Every day | ORAL | Status: DC
  Administered 2023-11-02 – 2023-11-03 (×2): 30 mg via ORAL
  Filled 2023-11-02 (×2): qty 1

## 2023-11-02 MED ORDER — TRANEXAMIC ACID-NACL 1000-0.7 MG/100ML-% IV SOLN
INTRAVENOUS | Status: AC
  Administered 2023-11-02: 1000 mg
  Filled 2023-11-02: qty 100

## 2023-11-02 MED ORDER — SODIUM CHLORIDE 0.9% FLUSH
3.0000 mL | Freq: Two times a day (BID) | INTRAVENOUS | Status: DC
  Administered 2023-11-02: 3 mL via INTRAVENOUS

## 2023-11-02 MED ORDER — SENNOSIDES-DOCUSATE SODIUM 8.6-50 MG PO TABS
2.0000 | ORAL_TABLET | ORAL | Status: DC
  Administered 2023-11-03: 2 via ORAL
  Filled 2023-11-02: qty 2

## 2023-11-02 MED ORDER — METFORMIN HCL 500 MG PO TABS
500.0000 mg | ORAL_TABLET | Freq: Two times a day (BID) | ORAL | Status: DC
  Administered 2023-11-02 – 2023-11-03 (×2): 500 mg via ORAL
  Filled 2023-11-02 (×2): qty 1

## 2023-11-02 MED ORDER — OXYCODONE HCL 5 MG PO TABS
10.0000 mg | ORAL_TABLET | ORAL | Status: DC | PRN
  Administered 2023-11-03: 10 mg via ORAL
  Filled 2023-11-02: qty 2

## 2023-11-02 MED ORDER — POTASSIUM CHLORIDE CRYS ER 20 MEQ PO TBCR
40.0000 meq | EXTENDED_RELEASE_TABLET | Freq: Every day | ORAL | Status: DC
  Administered 2023-11-02 – 2023-11-03 (×2): 40 meq via ORAL
  Filled 2023-11-02 (×2): qty 2

## 2023-11-02 MED ORDER — SIMETHICONE 80 MG PO CHEW: 80.0000 mg | CHEWABLE_TABLET | ORAL | Status: DC | PRN

## 2023-11-02 MED ORDER — TRANEXAMIC ACID-NACL 1000-0.7 MG/100ML-% IV SOLN: 1000.0000 mg | INTRAVENOUS | Status: AC

## 2023-11-02 MED ORDER — POTASSIUM CHLORIDE CRYS ER 20 MEQ PO TBCR
40.0000 meq | EXTENDED_RELEASE_TABLET | Freq: Two times a day (BID) | ORAL | Status: DC
  Administered 2023-11-02: 40 meq via ORAL
  Filled 2023-11-02 (×2): qty 2

## 2023-11-02 MED ORDER — BENZOCAINE-MENTHOL 20-0.5 % EX AERO: 1.0000 | INHALATION_SPRAY | CUTANEOUS | Status: DC | PRN

## 2023-11-02 MED ORDER — LIDOCAINE HCL (PF) 1 % IJ SOLN
INTRAMUSCULAR | Status: DC | PRN
Start: 2023-11-02 — End: 2023-11-02
  Administered 2023-11-02 (×2): 5 mL via EPIDURAL

## 2023-11-02 MED ORDER — ONDANSETRON HCL 4 MG/2ML IJ SOLN: 4.0000 mg | INTRAMUSCULAR | Status: DC | PRN

## 2023-11-02 MED ORDER — OXYCODONE HCL 5 MG PO TABS: 5.0000 mg | ORAL_TABLET | ORAL | Status: DC | PRN

## 2023-11-02 MED ORDER — SODIUM CHLORIDE 0.9 % IV SOLN: 250.0000 mL | INTRAVENOUS | Status: DC | PRN

## 2023-11-02 MED ORDER — WITCH HAZEL-GLYCERIN EX PADS: 1.0000 | MEDICATED_PAD | CUTANEOUS | Status: DC | PRN

## 2023-11-02 NOTE — Discharge Summary (Signed)
 Postpartum Discharge Summary  Date of Service updated***     Patient Name: Tami Crawford DOB: Mar 23, 1986 MRN: [id number]  Date of admission: 11/01/2023 Delivery date:11/02/2023 Delivering provider: VON REASONER Date of discharge: 11/02/2023  Admitting diagnosis: Pregnancy [Z34.90] Intrauterine pregnancy: [redacted]w[redacted]d     Secondary diagnosis:  Principal Problem:   Pregnancy Active Problems:   SVD (spontaneous vaginal delivery)  Additional problems: ***    Discharge diagnosis: Term Pregnancy Delivered, Gestational Hypertension, and Type 2 DM                                              Post partum procedures:{Postpartum procedures:23558} Augmentation: AROM, Pitocin , and Cytotec  Complications: {OB Labor/Delivery Complications:20784}  Hospital course: Induction of Labor With Vaginal Delivery   38 y.o. yo H4E6986 at [redacted]w[redacted]d was admitted to the hospital 11/01/2023 for induction of labor.  Indication for induction: TYPE 2 DM.  Patient had an labor course complicated by requirement of Endotool Membrane Rupture Time/Date: 5:50 AM,11/02/2023  Delivery Method:Vaginal, Spontaneous Operative Delivery:N/A Episiotomy: None Lacerations:  Perineal Details of delivery can be found in separate delivery note.  Patient had a postpartum course complicated by***. Patient is discharged home 11/02/23.  Newborn Data: Birth date:11/02/2023 Birth time:1:49 PM Gender:Female Living status:Living Apgars:7 ,9  Weight:4100 g  Magnesium  Sulfate received: {Mag received:30440022} BMZ received: No Rhophylac:N/A MMR:N/A T-DaP:Given prenatally Flu: No RSV Vaccine received: No Transfusion:{Transfusion received:30440034}  Immunizations received: Immunization History  Administered Date(s) Administered   Influenza,inj,Quad PF,6+ Mos 03/17/2013, 01/27/2015   Tdap 03/17/2013, 07/15/2021    Physical exam  Vitals:   11/02/23 1428 11/02/23 1432 11/02/23 1447 11/02/23 1500  BP: (!) 143/92 (!) 145/89 137/88 (!)  135/95  Pulse: 84 80 89 91  Resp: 15     Temp: 98.3 F (36.8 C)     TempSrc: Oral     SpO2:      Weight:      Height:       General: {Exam; general:21111117} Lochia: {Desc; appropriate/inappropriate:30686::appropriate} Uterine Fundus: {Desc; firm/soft:30687} Incision: {Exam; incision:21111123} DVT Evaluation: {Exam; dvt:2111122} Labs: Lab Results  Component Value Date   WBC 7.3 11/02/2023   HGB 11.8 (L) 11/02/2023   HCT 36.2 11/02/2023   MCV 83.8 11/02/2023   PLT 184 11/02/2023      Latest Ref Rng & Units 11/02/2023   12:04 AM  CMP  Glucose 70 - 99 mg/dL 894   BUN 6 - 20 mg/dL <5   Creatinine 9.55 - 1.00 mg/dL 9.57   Sodium 864 - 854 mmol/L 136   Potassium 3.5 - 5.1 mmol/L 3.2   Chloride 98 - 111 mmol/L 109   CO2 22 - 32 mmol/L 22   Calcium 8.9 - 10.3 mg/dL 8.5    Edinburgh Score:    11/10/2021   11:08 AM  Edinburgh Postnatal Depression Scale Screening Tool  I have been able to laugh and see the funny side of things. 0  I have looked forward with enjoyment to things. 0  I have blamed myself unnecessarily when things went wrong. 0  I have been anxious or worried for no good reason. 0  I have felt scared or panicky for no good reason. 0  Things have been getting on top of me. 0  I have been so unhappy that I have had difficulty sleeping. 0  I have felt sad or miserable. 0  I have been so unhappy that I have been crying. 0  The thought of harming myself has occurred to me. 0  Edinburgh Postnatal Depression Scale Total 0      Data saved with a previous flowsheet row definition   No data recorded  After visit meds:  Allergies as of 11/02/2023   No Known Allergies   Med Rec must be completed prior to using this [facility]***        Discharge home in stable condition Infant Feeding: Both Infant Disposition:home with mother Discharge instruction: per After Visit Summary and Postpartum booklet. Activity: Advance as tolerated. Pelvic rest for 6 weeks.   Diet: {OB ipzu:78888878} Future Appointments:No future appointments. Follow up Visit: Message sent to [facility] 7/1  Please schedule this patient for a In person postpartum visit in 6 weeks with the following provider: Any provider. Additional Postpartum F/U:BP check 1 week  High risk pregnancy complicated by: GDM and HTN Delivery mode:  Vaginal, Spontaneous Anticipated Birth Control:  Nexplanon outpatient   11/02/2023 Alain Sor, MD

## 2023-11-02 NOTE — Progress Notes (Signed)
 Verbal from Dr. Von to call for BP parameters 140/97

## 2023-11-02 NOTE — Anesthesia Procedure Notes (Signed)
 Epidural Patient location during procedure: OB Start time: 11/02/2023 5:02 AM End time: 11/02/2023 5:11 AM  Preanesthetic Checklist Completed: patient identified, IV checked, site marked, risks and benefits discussed, surgical consent, monitors and equipment checked, pre-op evaluation and timeout performed  Epidural Patient position: sitting Prep: DuraPrep and site prepped and draped Patient monitoring: continuous pulse ox and blood pressure Approach: midline Location: L3-L4 Injection technique: LOR air  Needle:  Needle type: Tuohy  Needle gauge: 17 G Needle length: 9 cm and 9 Needle insertion depth: 8 cm Catheter type: closed end flexible Catheter size: 19 Gauge Catheter at skin depth: 13 cm Test dose: negative and Other  Assessment Events: blood not aspirated, no cerebrospinal fluid, injection not painful, no injection resistance, no paresthesia and negative IV test  Additional Notes Patient identified. Risks and benefits discussed including failed block, incomplete  Pain control, post dural puncture headache, nerve damage, paralysis, blood pressure Changes, nausea, vomiting, reactions to medications-both toxic and allergic and post Partum back pain. All questions were answered. Patient expressed understanding and wished to proceed. Sterile technique was used throughout procedure. Epidural site was Dressed with sterile barrier dressing. No paresthesias, signs of intravascular injection Or signs of intrathecal spread were encountered.  Patient was more comfortable after the epidural was dosed. Please see RN's note for documentation of vital signs and FHR which are stable. ]Reason for block:procedure for pain

## 2023-11-02 NOTE — Inpatient Diabetes Management (Signed)
 Inpatient Diabetes Program Recommendations  AACE/ADA: New Consensus Statement on Inpatient Glycemic Control (2015)  Target Ranges:  Prepandial:   less than 140 mg/dL      Peak postprandial:   less than 180 mg/dL (1-2 hours)      Critically ill patients:  140 - 180 mg/dL   Lab Results  Component Value Date   GLUCAP 120 (H) 11/02/2023   HGBA1C 7.6 (H) 11/01/2023    Diabetes history: DM2 Outpatient Diabetes medications: Lantus  16 units at bedtime, Metformin  1000 mg BID Current orders for Inpatient glycemic control: IV insulin    Inpatient Diabetes Program Recommendations:     Please consider discontinuing Semglee  7 units at bedtime.  I do not expect her to need any insulin  once delivered.  Will follow.  Thank you, Wyvonna Pinal, MSN, CDCES Diabetes Coordinator Inpatient Diabetes Program [phone] (team pager from 8a-5p)

## 2023-11-02 NOTE — Lactation Note (Signed)
 This note was copied from a baby's chart. Lactation Consultation Note  Patient Name: Tami Crawford Unijb'd Date: 11/02/2023 Age:36 hours  Reason for consult: Initial assessment;Early term 37-38.6wks;Maternal endocrine disorder  Parents speak English and communicated well with this LC  P4, [redacted]w[redacted]d,GDM  Initial LC visit with P4 mother with breastfeeding experience. Upon entry to room baby Tami Tami Crawford was getting her blood drawn to assess her blood sugar. Discussed infant feeding at breast and skin to skin to help manage her blood sugar. Mother and father verbalized understanding.   Mother says baby is latching well and declined need for assistance. Mother encouraged to latch baby with feeding cues, place baby skin to skin if not latching, and call for assistance with breastfeeding as needed. Baby may need to be awakened with diaper changes if not showing cues. Discussed to anticipate as baby approaches 24 hours of age, baby should breastfeed more often, 8-12 plus times in 24 hours, and cluster feedings.     Mom made aware of O/P services, breastfeeding support groups, community resources, and our phone # for post-discharge questions.    Mother has never used a breast pump and declines need for a pump at this time.    Maternal Data Has patient been taught Hand Expression?: No Does the patient have breastfeeding experience prior to this delivery?: Yes How long did the patient breastfeed?: > 1 year with each of her other 3 children ( boys)  Feeding Mother's Current Feeding Choice: Breast Milk and Formula  LATCH Score Latch: Repeated attempts needed to sustain latch, nipple held in mouth throughout feeding, stimulation needed to elicit sucking reflex.  Audible Swallowing: Spontaneous and intermittent  Type of Nipple: Everted at rest and after stimulation  Comfort (Breast/Nipple): Soft / non-tender  Hold (Positioning): Assistance needed to correctly position infant at breast  and maintain latch.  LATCH Score: 8       Interventions Interventions: Breast feeding basics reviewed;Hand express;Education;LC Services brochure  Discharge Pump: Declined  Consult Status Consult Status: Follow-up Date: 11/03/23 Follow-up type: In-patient    Joshua Line M 11/02/2023, 5:59 PM

## 2023-11-02 NOTE — Progress Notes (Signed)
 LABOR PROGRESS NOTE  Patient Name: Tami Crawford, female   DOB: [birth year], 37 y.o.  MRN: [id number]  Pt comfortable with epidural. Agreeable to AROM.   Blood pressure 115/70, pulse 70, temperature 98.1 F (36.7 C), temperature source Oral, resp. rate 17, height 5' 5 (1.651 m), weight 118.2 kg, last menstrual period 03/06/2023, SpO2 99%.  Dilation: 5 Effacement (%): 50 Cervical Position: Anterior Station: -2 Presentation: Vertex Exam by:: Dr. Loyola Difficult exam due to anterior position.   EFM: baseline 150, accels, no decels, moderate variability TOCO: q1-75min contractions  AROM clear, copious. Babe guided to -2 and well applied.  Continue to titrate pitocin .  Reassess in 4-6 hours; sooner as indicated.  Anticipate NSVD  Ewen Varnell, MD

## 2023-11-02 NOTE — Progress Notes (Signed)
 Labor Progress Note Tami Crawford is a 38 y.o. [id number] at [redacted]w[redacted]d presented for IOL for T2DM  S: Comfortable at bedside, no questions at this time.  O:  BP (!) 150/96   Pulse 71   Temp 97.7 F (36.5 C) (Oral)   Resp 17   Ht 5' 5 (1.651 m)   Wt 118.2 kg   LMP 03/06/2023 (Approximate)   SpO2 99%   BMI 43.37 kg/m  EFM: 150/mod/+a/+occ early/variables  CVE: Dilation: 5 Effacement (%): 60 Cervical Position: Anterior Station: -3 Presentation: Vertex Exam by:: Mady Pals, RN   A&P: 38 y.o. H4E6986 [redacted]w[redacted]d here for IOL  #Labor: Progressing well. Cont uptitrating Pitocin  as able. #Pain: Epidural #FWB: Cat I  #GBS positive, on PCN  #T2DM: On Endotool, CBGs ok  EFW 2678g (>99%tile), AC >99%tile at [redacted]w[redacted]d  poly at 23.05cm on 342d  Fetal echo wnl 3/26  #BMI 43  #AMA  Alain Sor, MD 8:44 AM

## 2023-11-03 ENCOUNTER — Other Ambulatory Visit (HOSPITAL_COMMUNITY): Payer: Self-pay

## 2023-11-03 ENCOUNTER — Other Ambulatory Visit: Payer: Self-pay

## 2023-11-03 LAB — GLUCOSE, CAPILLARY: Glucose-Capillary: 122 mg/dL — ABNORMAL HIGH (ref 70–99)

## 2023-11-03 MED ORDER — SENNOSIDES-DOCUSATE SODIUM 8.6-50 MG PO TABS
2.0000 | ORAL_TABLET | Freq: Every day | ORAL | 0 refills | Status: DC | PRN
  Filled 2023-11-03: qty 30, 15d supply, fill #0

## 2023-11-03 MED ORDER — NIFEDIPINE ER 30 MG PO TB24
30.0000 mg | ORAL_TABLET | Freq: Every day | ORAL | 3 refills | Status: DC
  Filled 2023-11-03: qty 30, 30d supply, fill #0

## 2023-11-03 MED ORDER — FUROSEMIDE 40 MG PO TABS
40.0000 mg | ORAL_TABLET | Freq: Every day | ORAL | 0 refills | Status: DC
  Filled 2023-11-03: qty 5, 5d supply, fill #0

## 2023-11-03 MED ORDER — IBUPROFEN 800 MG PO TABS
800.0000 mg | ORAL_TABLET | Freq: Three times a day (TID) | ORAL | 0 refills | Status: DC
  Filled 2023-11-03: qty 30, 10d supply, fill #0

## 2023-11-03 MED ORDER — METFORMIN HCL 500 MG PO TABS
500.0000 mg | ORAL_TABLET | Freq: Two times a day (BID) | ORAL | 11 refills | Status: AC
  Filled 2023-11-03: qty 60, 30d supply, fill #0

## 2023-11-03 NOTE — Patient Instructions (Signed)
 If interested in an outpatient lactation consult in office or virtually please reach out to us  at [facility] (First Floor) [address], [place] Please call [phone] and press 4 for lactation.    Melodi Sprung, IBCLC and [employer] for [facility]

## 2023-11-03 NOTE — Progress Notes (Signed)
 Post Partum Day 1 Subjective: no complaints, up ad lib, voiding, tolerating PO, and + flatus  Objective: Blood pressure 114/75, pulse 79, temperature 98 F (36.7 C), temperature source Oral, resp. rate 20, height 5' 5 (1.651 m), weight 118.2 kg, last menstrual period 03/06/2023, SpO2 100%, unknown if currently breastfeeding.  Physical Exam:  General: alert, cooperative, and no distress Lochia: appropriate Uterine Fundus: firm Incision: n/a DVT Evaluation: No evidence of DVT seen on physical exam.  Recent Labs    11/02/23 0004 11/02/23 1516  HGB 11.8* 13.6  HCT 36.2 40.7    Assessment/Plan: Breastfeeding and Contraception Nexplanon outpatient Pt desires discharge today if baby is discharged by pediatric team 24 hour labs for baby expected at 1400 today   LOS: 2 days   Olam Boards, CNM 11/03/2023, 12:23 PM

## 2023-11-03 NOTE — Anesthesia Postprocedure Evaluation (Signed)
 Anesthesia Post Note  Patient: Tami Crawford  Procedure(s) Performed: AN AD HOC LABOR EPIDURAL     Patient location during evaluation: Mother Baby Anesthesia Type: Epidural Level of consciousness: awake and alert Pain management: pain level controlled Vital Signs Assessment: post-procedure vital signs reviewed and stable Respiratory status: spontaneous breathing, nonlabored ventilation and respiratory function stable Cardiovascular status: stable Postop Assessment: no headache, no backache and epidural receding Anesthetic complications: no   No notable events documented.  Last Vitals:  Vitals:   11/03/23 0230 11/03/23 0658  BP: 126/79 114/75  Pulse: 79 79  Resp: 18 20  Temp: 36.7 C 36.7 C  SpO2: 100% 100%    Last Pain:  Vitals:   11/03/23 0732  TempSrc:   PainSc: 0-No pain   Pain Goal:                   [name]

## 2023-11-03 NOTE — Plan of Care (Signed)
 Problem: Education: Goal: Knowledge of General Education information will improve Description: Including pain rating scale, medication(s)/side effects and non-pharmacologic comfort measures 11/03/2023 1626 by Madison Rosina LABOR, LPN Outcome: Adequate for Discharge 11/03/2023 1626 by Madison Rosina LABOR, LPN Outcome: Progressing 11/03/2023 0716 by Madison Rosina LABOR, LPN Outcome: Progressing   Problem: Health Behavior/Discharge Planning: Goal: Ability to manage health-related needs will improve 11/03/2023 1626 by Madison Rosina LABOR, LPN Outcome: Adequate for Discharge 11/03/2023 1626 by Madison Rosina LABOR, LPN Outcome: Progressing 11/03/2023 0716 by Madison Rosina LABOR, LPN Outcome: Progressing   Problem: Clinical Measurements: Goal: Ability to maintain clinical measurements within normal limits will improve 11/03/2023 1626 by Madison Rosina LABOR, LPN Outcome: Adequate for Discharge 11/03/2023 1626 by Madison Rosina LABOR, LPN Outcome: Progressing 11/03/2023 0716 by Madison Rosina LABOR, LPN Outcome: Progressing Goal: Will remain free from infection 11/03/2023 1626 by Madison Rosina LABOR, LPN Outcome: Adequate for Discharge 11/03/2023 1626 by Madison Rosina LABOR, LPN Outcome: Progressing 11/03/2023 0716 by Madison Rosina LABOR, LPN Outcome: Progressing Goal: Diagnostic test results will improve 11/03/2023 1626 by Madison Rosina LABOR, LPN Outcome: Adequate for Discharge 11/03/2023 1626 by Madison Rosina LABOR, LPN Outcome: Progressing 11/03/2023 0716 by Madison Rosina LABOR, LPN Outcome: Progressing Goal: Respiratory complications will improve 11/03/2023 1626 by Madison Rosina LABOR, LPN Outcome: Adequate for Discharge 11/03/2023 1626 by Madison Rosina LABOR, LPN Outcome: Progressing 11/03/2023 0716 by Madison Rosina LABOR, LPN Outcome: Progressing Goal: Cardiovascular complication will be avoided 11/03/2023 1626 by Madison Rosina LABOR, LPN Outcome: Adequate for Discharge 11/03/2023 1626 by Madison Rosina LABOR, LPN Outcome: Progressing 11/03/2023 0716 by Madison Rosina LABOR, LPN Outcome: Progressing    Problem: Activity: Goal: Risk for activity intolerance will decrease 11/03/2023 1626 by Madison Rosina LABOR, LPN Outcome: Adequate for Discharge 11/03/2023 1626 by Madison Rosina LABOR, LPN Outcome: Progressing 11/03/2023 0716 by Madison Rosina LABOR, LPN Outcome: Progressing   Problem: Nutrition: Goal: Adequate nutrition will be maintained 11/03/2023 1626 by Madison Rosina LABOR, LPN Outcome: Adequate for Discharge 11/03/2023 1626 by Madison Rosina LABOR, LPN Outcome: Progressing 11/03/2023 0716 by Madison Rosina LABOR, LPN Outcome: Progressing   Problem: Coping: Goal: Level of anxiety will decrease 11/03/2023 1626 by Madison Rosina LABOR, LPN Outcome: Adequate for Discharge 11/03/2023 1626 by Madison Rosina LABOR, LPN Outcome: Progressing 11/03/2023 0716 by Madison Rosina LABOR, LPN Outcome: Progressing   Problem: Elimination: Goal: Will not experience complications related to bowel motility 11/03/2023 1626 by Madison Rosina LABOR, LPN Outcome: Adequate for Discharge 11/03/2023 1626 by Madison Rosina LABOR, LPN Outcome: Progressing 11/03/2023 0716 by Madison Rosina LABOR, LPN Outcome: Progressing Goal: Will not experience complications related to urinary retention 11/03/2023 1626 by Madison Rosina LABOR, LPN Outcome: Adequate for Discharge 11/03/2023 1626 by Madison Rosina LABOR, LPN Outcome: Progressing 11/03/2023 0716 by Madison Rosina LABOR, LPN Outcome: Progressing   Problem: Pain Managment: Goal: General experience of comfort will improve and/or be controlled 11/03/2023 1626 by Madison Rosina LABOR, LPN Outcome: Adequate for Discharge 11/03/2023 1626 by Madison Rosina LABOR, LPN Outcome: Progressing 11/03/2023 0716 by Madison Rosina LABOR, LPN Outcome: Progressing   Problem: Safety: Goal: Ability to remain free from injury will improve 11/03/2023 1626 by Madison Rosina LABOR, LPN Outcome: Adequate for Discharge 11/03/2023 1626 by Madison Rosina LABOR, LPN Outcome: Progressing 11/03/2023 0716 by Madison Rosina LABOR, LPN Outcome: Progressing   Problem: Skin Integrity: Goal: Risk for impaired skin integrity will  decrease 11/03/2023 1626 by Madison Rosina LABOR, LPN Outcome: Adequate for Discharge 11/03/2023 1626 by Madison Rosina LABOR, LPN Outcome: Progressing 11/03/2023 0716 by Madison  Rosina LABOR, LPN Outcome: Progressing   Problem: Education: Goal: Knowledge of Childbirth will improve 11/03/2023 1626 by Madison Rosina LABOR, LPN Outcome: Adequate for Discharge 11/03/2023 1626 by Madison Rosina LABOR, LPN Outcome: Progressing 11/03/2023 0716 by Madison Rosina LABOR, LPN Outcome: Progressing Goal: Ability to make informed decisions regarding treatment and plan of care will improve 11/03/2023 1626 by Madison Rosina LABOR, LPN Outcome: Adequate for Discharge 11/03/2023 1626 by Madison Rosina LABOR, LPN Outcome: Progressing 11/03/2023 0716 by Madison Rosina LABOR, LPN Outcome: Progressing Goal: Ability to state and carry out methods to decrease the pain will improve 11/03/2023 1626 by Madison Rosina LABOR, LPN Outcome: Adequate for Discharge 11/03/2023 1626 by Madison Rosina LABOR, LPN Outcome: Progressing 11/03/2023 0716 by Madison Rosina LABOR, LPN Outcome: Progressing Goal: Individualized Educational Video(s) 11/03/2023 1626 by Madison Rosina LABOR, LPN Outcome: Adequate for Discharge 11/03/2023 1626 by Madison Rosina LABOR, LPN Outcome: Progressing 11/03/2023 0716 by Madison Rosina LABOR, LPN Outcome: Progressing   Problem: Coping: Goal: Ability to verbalize concerns and feelings about labor and delivery will improve 11/03/2023 1626 by Madison Rosina LABOR, LPN Outcome: Adequate for Discharge 11/03/2023 1626 by Madison Rosina LABOR, LPN Outcome: Progressing 11/03/2023 0716 by Madison Rosina LABOR, LPN Outcome: Progressing   Problem: Life Cycle: Goal: Ability to make normal progression through stages of labor will improve 11/03/2023 1626 by Madison Rosina LABOR, LPN Outcome: Adequate for Discharge 11/03/2023 1626 by Madison Rosina LABOR, LPN Outcome: Progressing 11/03/2023 0716 by Madison Rosina LABOR, LPN Outcome: Progressing Goal: Ability to effectively push during vaginal delivery will improve 11/03/2023 1626 by Madison Rosina LABOR,  LPN Outcome: Adequate for Discharge 11/03/2023 1626 by Madison Rosina LABOR, LPN Outcome: Progressing 11/03/2023 0716 by Madison Rosina LABOR, LPN Outcome: Progressing   Problem: Role Relationship: Goal: Will demonstrate positive interactions with the child 11/03/2023 1626 by Madison Rosina LABOR, LPN Outcome: Adequate for Discharge 11/03/2023 1626 by Madison Rosina LABOR, LPN Outcome: Progressing 11/03/2023 0716 by Madison Rosina LABOR, LPN Outcome: Progressing   Problem: Safety: Goal: Risk of complications during labor and delivery will decrease 11/03/2023 1626 by Madison Rosina LABOR, LPN Outcome: Adequate for Discharge 11/03/2023 1626 by Madison Rosina LABOR, LPN Outcome: Progressing 11/03/2023 0716 by Madison Rosina LABOR, LPN Outcome: Progressing   Problem: Pain Management: Goal: Relief or control of pain from uterine contractions will improve 11/03/2023 1626 by Madison Rosina LABOR, LPN Outcome: Adequate for Discharge 11/03/2023 1626 by Madison Rosina LABOR, LPN Outcome: Progressing 11/03/2023 0716 by Madison Rosina LABOR, LPN Outcome: Progressing   Problem: Education: Goal: Ability to describe self-care measures that may prevent or decrease complications (Diabetes Survival Skills Education) will improve 11/03/2023 1626 by Madison Rosina LABOR, LPN Outcome: Adequate for Discharge 11/03/2023 1626 by Madison Rosina LABOR, LPN Outcome: Progressing 11/03/2023 0716 by Madison Rosina LABOR, LPN Outcome: Progressing Goal: Individualized Educational Video(s) 11/03/2023 1626 by Madison Rosina LABOR, LPN Outcome: Adequate for Discharge 11/03/2023 1626 by Madison Rosina LABOR, LPN Outcome: Progressing 11/03/2023 0716 by Madison Rosina LABOR, LPN Outcome: Progressing   Problem: Coping: Goal: Ability to adjust to condition or change in health will improve 11/03/2023 1626 by Madison Rosina LABOR, LPN Outcome: Adequate for Discharge 11/03/2023 1626 by Madison Rosina LABOR, LPN Outcome: Progressing 11/03/2023 0716 by Madison Rosina LABOR, LPN Outcome: Progressing   Problem: Fluid Volume: Goal: Ability to maintain a  balanced intake and output will improve 11/03/2023 1626 by Madison Rosina LABOR, LPN Outcome: Adequate for Discharge 11/03/2023 1626 by Madison Rosina LABOR, LPN Outcome: Progressing 11/03/2023 0716 by Madison Rosina LABOR, LPN Outcome: Progressing  Problem: Health Behavior/Discharge Planning: Goal: Ability to identify and utilize available resources and services will improve 11/03/2023 1626 by Madison Rosina LABOR, LPN Outcome: Adequate for Discharge 11/03/2023 1626 by Madison Rosina LABOR, LPN Outcome: Progressing 11/03/2023 0716 by Madison Rosina LABOR, LPN Outcome: Progressing Goal: Ability to manage health-related needs will improve 11/03/2023 1626 by Madison Rosina LABOR, LPN Outcome: Adequate for Discharge 11/03/2023 1626 by Madison Rosina LABOR, LPN Outcome: Progressing 11/03/2023 0716 by Madison Rosina LABOR, LPN Outcome: Progressing   Problem: Metabolic: Goal: Ability to maintain appropriate glucose levels will improve 11/03/2023 1626 by Madison Rosina LABOR, LPN Outcome: Adequate for Discharge 11/03/2023 1626 by Madison Rosina LABOR, LPN Outcome: Progressing 11/03/2023 0716 by Madison Rosina LABOR, LPN Outcome: Progressing   Problem: Nutritional: Goal: Maintenance of adequate nutrition will improve 11/03/2023 1626 by Madison Rosina LABOR, LPN Outcome: Adequate for Discharge 11/03/2023 1626 by Madison Rosina LABOR, LPN Outcome: Progressing 11/03/2023 0716 by Madison Rosina LABOR, LPN Outcome: Progressing Goal: Progress toward achieving an optimal weight will improve 11/03/2023 1626 by Madison Rosina LABOR, LPN Outcome: Adequate for Discharge 11/03/2023 1626 by Madison Rosina LABOR, LPN Outcome: Progressing 11/03/2023 0716 by Madison Rosina LABOR, LPN Outcome: Progressing   Problem: Skin Integrity: Goal: Risk for impaired skin integrity will decrease 11/03/2023 1626 by Madison Rosina LABOR, LPN Outcome: Adequate for Discharge 11/03/2023 1626 by Madison Rosina LABOR, LPN Outcome: Progressing 11/03/2023 0716 by Madison Rosina LABOR, LPN Outcome: Progressing   Problem: Tissue Perfusion: Goal: Adequacy of tissue  perfusion will improve 11/03/2023 1626 by Madison Rosina LABOR, LPN Outcome: Adequate for Discharge 11/03/2023 1626 by Madison Rosina LABOR, LPN Outcome: Progressing 11/03/2023 0716 by Madison Rosina LABOR, LPN Outcome: Progressing   Problem: Education: Goal: Knowledge of condition will improve 11/03/2023 1626 by Madison Rosina LABOR, LPN Outcome: Adequate for Discharge 11/03/2023 1626 by Madison Rosina LABOR, LPN Outcome: Progressing 11/03/2023 0716 by Madison Rosina LABOR, LPN Outcome: Progressing Goal: Individualized Educational Video(s) 11/03/2023 1626 by Madison Rosina LABOR, LPN Outcome: Adequate for Discharge 11/03/2023 1626 by Madison Rosina LABOR, LPN Outcome: Progressing 11/03/2023 0716 by Madison Rosina LABOR, LPN Outcome: Progressing Goal: Individualized Newborn Educational Video(s) 11/03/2023 1626 by Madison Rosina LABOR, LPN Outcome: Adequate for Discharge 11/03/2023 1626 by Madison Rosina LABOR, LPN Outcome: Progressing 11/03/2023 0716 by Madison Rosina LABOR, LPN Outcome: Progressing   Problem: Activity: Goal: Will verbalize the importance of balancing activity with adequate rest periods 11/03/2023 1626 by Madison Rosina LABOR, LPN Outcome: Adequate for Discharge 11/03/2023 1626 by Madison Rosina LABOR, LPN Outcome: Progressing 11/03/2023 0716 by Madison Rosina LABOR, LPN Outcome: Progressing Goal: Ability to tolerate increased activity will improve 11/03/2023 1626 by Madison Rosina LABOR, LPN Outcome: Adequate for Discharge 11/03/2023 1626 by Madison Rosina LABOR, LPN Outcome: Progressing 11/03/2023 0716 by Madison Rosina LABOR, LPN Outcome: Progressing   Problem: Coping: Goal: Ability to identify and utilize available resources and services will improve 11/03/2023 1626 by Madison Rosina LABOR, LPN Outcome: Adequate for Discharge 11/03/2023 1626 by Madison Rosina LABOR, LPN Outcome: Progressing 11/03/2023 0716 by Madison Rosina LABOR, LPN Outcome: Progressing   Problem: Life Cycle: Goal: Chance of risk for complications during the postpartum period will decrease 11/03/2023 1626 by Madison Rosina LABOR,  LPN Outcome: Adequate for Discharge 11/03/2023 1626 by Madison Rosina LABOR, LPN Outcome: Progressing 11/03/2023 0716 by Madison Rosina LABOR, LPN Outcome: Progressing   Problem: Role Relationship: Goal: Ability to demonstrate positive interaction with newborn will improve 11/03/2023 1626 by Madison Rosina LABOR, LPN Outcome: Adequate for Discharge 11/03/2023 1626 by Madison Rosina LABOR, LPN Outcome:  Progressing 11/03/2023 0716 by Madison Rosina LABOR, LPN Outcome: Progressing   Problem: Skin Integrity: Goal: Demonstration of wound healing without infection will improve 11/03/2023 1626 by Madison Rosina LABOR, LPN Outcome: Adequate for Discharge 11/03/2023 1626 by Madison Rosina LABOR, LPN Outcome: Progressing 11/03/2023 0716 by Madison Rosina LABOR, LPN Outcome: Progressing

## 2023-11-03 NOTE — Plan of Care (Signed)
 Problem: Education: Goal: Knowledge of General Education information will improve Description: Including pain rating scale, medication(s)/side effects and non-pharmacologic comfort measures Outcome: Progressing   Problem: Health Behavior/Discharge Planning: Goal: Ability to manage health-related needs will improve Outcome: Progressing   Problem: Clinical Measurements: Goal: Ability to maintain clinical measurements within normal limits will improve Outcome: Progressing Goal: Will remain free from infection Outcome: Progressing Goal: Diagnostic test results will improve Outcome: Progressing Goal: Respiratory complications will improve Outcome: Progressing Goal: Cardiovascular complication will be avoided Outcome: Progressing   Problem: Activity: Goal: Risk for activity intolerance will decrease Outcome: Progressing   Problem: Nutrition: Goal: Adequate nutrition will be maintained Outcome: Progressing   Problem: Coping: Goal: Level of anxiety will decrease Outcome: Progressing   Problem: Elimination: Goal: Will not experience complications related to bowel motility Outcome: Progressing Goal: Will not experience complications related to urinary retention Outcome: Progressing   Problem: Pain Managment: Goal: General experience of comfort will improve and/or be controlled Outcome: Progressing   Problem: Safety: Goal: Ability to remain free from injury will improve Outcome: Progressing   Problem: Skin Integrity: Goal: Risk for impaired skin integrity will decrease Outcome: Progressing   Problem: Education: Goal: Knowledge of Childbirth will improve Outcome: Progressing Goal: Ability to make informed decisions regarding treatment and plan of care will improve Outcome: Progressing Goal: Ability to state and carry out methods to decrease the pain will improve Outcome: Progressing Goal: Individualized Educational Video(s) Outcome: Progressing   Problem:  Coping: Goal: Ability to verbalize concerns and feelings about labor and delivery will improve Outcome: Progressing   Problem: Life Cycle: Goal: Ability to make normal progression through stages of labor will improve Outcome: Progressing Goal: Ability to effectively push during vaginal delivery will improve Outcome: Progressing   Problem: Role Relationship: Goal: Will demonstrate positive interactions with the child Outcome: Progressing   Problem: Safety: Goal: Risk of complications during labor and delivery will decrease Outcome: Progressing   Problem: Pain Management: Goal: Relief or control of pain from uterine contractions will improve Outcome: Progressing   Problem: Education: Goal: Ability to describe self-care measures that may prevent or decrease complications (Diabetes Survival Skills Education) will improve Outcome: Progressing Goal: Individualized Educational Video(s) Outcome: Progressing   Problem: Coping: Goal: Ability to adjust to condition or change in health will improve Outcome: Progressing   Problem: Fluid Volume: Goal: Ability to maintain a balanced intake and output will improve Outcome: Progressing   Problem: Health Behavior/Discharge Planning: Goal: Ability to identify and utilize available resources and services will improve Outcome: Progressing Goal: Ability to manage health-related needs will improve Outcome: Progressing   Problem: Metabolic: Goal: Ability to maintain appropriate glucose levels will improve Outcome: Progressing   Problem: Nutritional: Goal: Maintenance of adequate nutrition will improve Outcome: Progressing Goal: Progress toward achieving an optimal weight will improve Outcome: Progressing   Problem: Skin Integrity: Goal: Risk for impaired skin integrity will decrease Outcome: Progressing   Problem: Tissue Perfusion: Goal: Adequacy of tissue perfusion will improve Outcome: Progressing   Problem: Education: Goal:  Knowledge of condition will improve Outcome: Progressing Goal: Individualized Educational Video(s) Outcome: Progressing Goal: Individualized Newborn Educational Video(s) Outcome: Progressing   Problem: Activity: Goal: Will verbalize the importance of balancing activity with adequate rest periods Outcome: Progressing Goal: Ability to tolerate increased activity will improve Outcome: Progressing   Problem: Coping: Goal: Ability to identify and utilize available resources and services will improve Outcome: Progressing   Problem: Life Cycle: Goal: Chance of risk for complications during the postpartum period will  decrease Outcome: Progressing   Problem: Role Relationship: Goal: Ability to demonstrate positive interaction with newborn will improve Outcome: Progressing   Problem: Skin Integrity: Goal: Demonstration of wound healing without infection will improve Outcome: Progressing

## 2023-11-11 ENCOUNTER — Encounter: Payer: Self-pay | Admitting: General Practice

## 2023-11-11 ENCOUNTER — Ambulatory Visit: Admitting: General Practice

## 2023-11-11 VITALS — BP 109/66 | HR 95 | Wt 233.0 lb

## 2023-11-11 DIAGNOSIS — Z0130 Encounter for examination of blood pressure without abnormal findings: Secondary | ICD-10-CM

## 2023-11-11 NOTE — Progress Notes (Signed)
 Patient presents to office today for blood pressure check following vaginal delivery on 7/1. She reports doing well since then. She has completed a course of Lasix  and is taking Nifedipine  30mg  daily. Patient reports having a daily headache with some dizziness, headache is improved with rest/Tylenol . BP 109/66. Patient will follow up at Pp visit on 8/11. Advised to reach out if headaches continue beyond this week.   Elenor DEL RN BSN 11/11/23

## 2023-12-03 ENCOUNTER — Other Ambulatory Visit: Payer: Self-pay | Admitting: Certified Nurse Midwife

## 2023-12-03 DIAGNOSIS — O24119 Pre-existing diabetes mellitus, type 2, in pregnancy, unspecified trimester: Secondary | ICD-10-CM

## 2023-12-05 NOTE — Telephone Encounter (Signed)
 Patient no longer needs Metformin  in Postpartum.

## 2023-12-13 ENCOUNTER — Ambulatory Visit: Admitting: Advanced Practice Midwife

## 2023-12-13 ENCOUNTER — Encounter: Payer: Self-pay | Admitting: Advanced Practice Midwife

## 2023-12-13 VITALS — BP 111/77 | HR 89 | Ht 67.0 in | Wt 228.6 lb

## 2023-12-13 DIAGNOSIS — Z3009 Encounter for other general counseling and advice on contraception: Secondary | ICD-10-CM

## 2023-12-13 DIAGNOSIS — Z3202 Encounter for pregnancy test, result negative: Secondary | ICD-10-CM | POA: Diagnosis not present

## 2023-12-13 DIAGNOSIS — K5900 Constipation, unspecified: Secondary | ICD-10-CM

## 2023-12-13 DIAGNOSIS — Z3042 Encounter for surveillance of injectable contraceptive: Secondary | ICD-10-CM | POA: Diagnosis not present

## 2023-12-13 LAB — POCT PREGNANCY, URINE: Preg Test, Ur: NEGATIVE

## 2023-12-13 MED ORDER — MEDROXYPROGESTERONE ACETATE 150 MG/ML IM SUSP: 150.0000 mg | INTRAMUSCULAR | 4 refills | Status: DC

## 2023-12-13 MED ORDER — MEDROXYPROGESTERONE ACETATE 150 MG/ML IM SUSP
150.0000 mg | Freq: Once | INTRAMUSCULAR | Status: AC
  Administered 2023-12-13 (×2): 150 mg via INTRAMUSCULAR

## 2023-12-13 NOTE — Progress Notes (Signed)
 Pt wants Depo for birth control

## 2023-12-13 NOTE — Progress Notes (Signed)
 Tami Crawford here for Depo-Provera   Injection.  Injection administered without complication. Patient will return in 3 monthsOc t27-03/12/24 for next injection.  Sharlet GORMAN Mulch, CMA 12/13/2023  3:28 PM

## 2023-12-13 NOTE — Progress Notes (Signed)
 Post Partum Visit Note  Tami Crawford is a 38 y.o. [id number] female who presents for a postpartum visit. She is 5 weeks postpartum following a normal spontaneous vaginal delivery.  I have fully reviewed the prenatal and intrapartum course. The delivery was at 37/1 gestational weeks.  Anesthesia: epidural. Postpartum course has been normal. Baby is doing well. Baby is feeding by both breast and bottle - Similac Advance. Bleeding staining only. Bowel function is normal. Bladder function is normal. Patient is not sexually active. Contraception method is none. Postpartum depression screening: negative.   The pregnancy intention screening data noted above was reviewed. Potential methods of contraception were discussed. The patient elected to proceed with No data recorded.   Edinburgh Postnatal Depression Scale - 12/13/23 1447       Edinburgh Postnatal Depression Scale:  In the Past 7 Days   I have been able to laugh and see the funny side of things. 0    I have looked forward with enjoyment to things. 0    I have blamed myself unnecessarily when things went wrong. 0    I have been anxious or worried for no good reason. 0    I have felt scared or panicky for no good reason. 0    Things have been getting on top of me. 0    I have been so unhappy that I have had difficulty sleeping. 0    I have felt sad or miserable. 0    I have been so unhappy that I have been crying. 0    The thought of harming myself has occurred to me. 0    Edinburgh Postnatal Depression Scale Total 0          Health Maintenance Due  Topic Date Due   FOOT EXAM  Never done   OPHTHALMOLOGY EXAM  Never done   Diabetic kidney evaluation - Urine ACR  Never done   Hepatitis B Vaccines (1 of 3 - 19+ 3-dose series) Never done   HPV VACCINES (1 - 3-dose SCDM series) Never done   COVID-19 Vaccine (1 - 2024-25 season) Never done   INFLUENZA VACCINE  12/03/2023    The following portions of the patient's history were  reviewed and updated as appropriate: allergies, current medications, past family history, past medical history, past social history, past surgical history, and problem list.  Review of Systems Pertinent items noted in HPI and remainder of comprehensive ROS otherwise negative.  Objective:  BP 111/77   Pulse 89   Ht 5' 7 (1.702 m)   Wt 228 lb 9.6 oz (103.7 kg)   LMP 03/06/2023 (Approximate)   Breastfeeding Yes Comment: Bottle also  BMI 35.80 kg/m   VS reviewed, nursing note reviewed,  Constitutional: well developed, well nourished, no distress HEENT: normocephalic CV: normal rate Pulm/chest wall: normal effort Abdomen: soft Neuro: alert and oriented x 3 Skin: warm, dry Psych: affect normal       Assessment:   1. Postpartum care following vaginal delivery --Doing well, bonding well with baby, good support at home  2. Encounter for counseling regarding contraception (Primary) --Discussed pt contraceptive plans and reviewed contraceptive methods based on pt preferences and effectiveness.  Pt prefers Depo. Is concerned about weight gain and average weight gain of 10-15 lbs reviewed. Pt with difficulty removing previous IUD so does not want another one, and was told that Nexplanon - medroxyPROGESTERone  (DEPO-PROVERA ) injection 150 mg - medroxyPROGESTERone  (DEPO-PROVERA ) 150 MG/ML injection; Inject 1 mL (150 mg total)  into the muscle every 3 (three) months.  Dispense: 1 mL; Refill: 4  3. Constipation, unspecified constipation type --mild, encouraged increased PO fluids and fiber, stool softeners/Miralax  as needed   Plan:   Essential components of care per ACOG recommendations:  1.  Mood and well being: Patient with negative depression screening today. Reviewed local resources for support.  - Patient tobacco use? No.   - hx of drug use? No.    2. Infant care and feeding:  -Patient currently breastmilk feeding? Yes. Reviewed importance of draining breast regularly to support  lactation.  -Social determinants of health (SDOH) reviewed in EPIC. No concerns   3. Sexuality, contraception and birth spacing - Patient does not want a pregnancy in the next year.   - Reviewed reproductive life planning. Reviewed contraceptive methods based on pt preferences and effectiveness.  Patient desired Hormonal Injection today.   - Discussed birth spacing of 18 months  4. Sleep and fatigue -Encouraged family/partner/community support of 4 hrs of uninterrupted sleep to help with mood and fatigue  5. Physical Recovery  - Discussed patients delivery and complications. She describes her labor as good. - Patient had a Vaginal, no problems at delivery. Patient had a perineal abrasion laceration. Perineal healing reviewed. Patient expressed understanding - Patient has urinary incontinence? No. - Patient is safe to resume physical and sexual activity  6.  Health Maintenance - HM due items addressed Yes - Last pap smear  Diagnosis  Date Value Ref Range Status  04/23/2021   Final   - Negative for intraepithelial lesion or malignancy (NILM)   Pap smear not done at today's visit.  -Breast Cancer screening indicated? No.   7. Chronic Disease/Pregnancy Condition follow up: T2DM on Metformin   - PCP follow up  Olam Boards, CNM [facility] [employer]

## 2023-12-16 ENCOUNTER — Ambulatory Visit: Admitting: Obstetrics and Gynecology

## 2024-03-21 ENCOUNTER — Ambulatory Visit: Admitting: General Practice

## 2024-03-21 ENCOUNTER — Other Ambulatory Visit: Payer: Self-pay

## 2024-03-21 ENCOUNTER — Encounter: Payer: Self-pay | Admitting: General Practice

## 2024-03-21 VITALS — BP 105/80 | HR 81 | Ht 68.0 in | Wt 226.0 lb

## 2024-03-21 DIAGNOSIS — Z3042 Encounter for surveillance of injectable contraceptive: Secondary | ICD-10-CM | POA: Diagnosis not present

## 2024-03-21 MED ORDER — MEDROXYPROGESTERONE ACETATE 150 MG/ML IM SUSY
150.0000 mg | PREFILLED_SYRINGE | Freq: Once | INTRAMUSCULAR | Status: AC
  Administered 2024-03-21: 150 mg via INTRAMUSCULAR

## 2024-03-21 NOTE — Progress Notes (Signed)
 Tami Crawford here for Depo-Provera  Injection. Injection administered without complication. Patient will return in 3 months for next injection between 2/3 and 2/17. Next annual visit due August 2026.   Elenor Mole, RN 03/21/2024  10:19 AM

## 2024-05-22 ENCOUNTER — Other Ambulatory Visit: Payer: Self-pay | Admitting: Family Medicine

## 2024-05-22 DIAGNOSIS — O3680X0 Pregnancy with inconclusive fetal viability, not applicable or unspecified: Secondary | ICD-10-CM

## 2024-06-06 ENCOUNTER — Ambulatory Visit
